# Patient Record
Sex: Male | Born: 1940
Health system: Southern US, Community
[De-identification: ages and names within clinical notes are randomized; demographics above are authoritative.]

## PROBLEM LIST (undated history)

## (undated) DIAGNOSIS — I10 Essential (primary) hypertension: Secondary | ICD-10-CM

## (undated) DIAGNOSIS — R040 Epistaxis: Secondary | ICD-10-CM

## (undated) DIAGNOSIS — K2270 Barrett's esophagus without dysplasia: Secondary | ICD-10-CM

## (undated) DIAGNOSIS — K219 Gastro-esophageal reflux disease without esophagitis: Secondary | ICD-10-CM

## (undated) DIAGNOSIS — M1990 Unspecified osteoarthritis, unspecified site: Secondary | ICD-10-CM

## (undated) DIAGNOSIS — R140 Abdominal distension (gaseous): Secondary | ICD-10-CM

## (undated) DIAGNOSIS — I2510 Atherosclerotic heart disease of native coronary artery without angina pectoris: Secondary | ICD-10-CM

## (undated) DIAGNOSIS — E785 Hyperlipidemia, unspecified: Secondary | ICD-10-CM

## (undated) DIAGNOSIS — R319 Hematuria, unspecified: Secondary | ICD-10-CM

## (undated) HISTORY — DX: Hematuria, unspecified: R31.9

## (undated) HISTORY — DX: Barrett's esophagus without dysplasia: K22.70

## (undated) HISTORY — DX: Unspecified osteoarthritis, unspecified site: M19.90

## (undated) HISTORY — PX: CARDIAC CATHETERIZATION: SHX172

## (undated) HISTORY — DX: Abdominal distension (gaseous): R14.0

---
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
## 1974-01-17 HISTORY — PX: VASECTOMY: SHX75

## 2008-02-25 ENCOUNTER — Encounter: Admission: RE | Admit: 2008-02-25 | Discharge: 2008-02-25 | Payer: Self-pay | Admitting: Family Medicine

## 2008-06-01 ENCOUNTER — Encounter: Admission: RE | Admit: 2008-06-01 | Discharge: 2008-06-01 | Payer: Self-pay | Admitting: Family Medicine

## 2008-10-31 ENCOUNTER — Emergency Department (HOSPITAL_COMMUNITY): Admission: EM | Admit: 2008-10-31 | Discharge: 2008-10-31 | Payer: Self-pay | Admitting: Emergency Medicine

## 2009-09-11 LAB — HM COLONOSCOPY

## 2010-04-22 LAB — CBC
Hemoglobin: 16.4 g/dL (ref 13.0–17.0)
MCHC: 36.1 g/dL — ABNORMAL HIGH (ref 30.0–36.0)
Platelets: 194 10*3/uL (ref 150–400)
RDW: 13.2 % (ref 11.5–15.5)

## 2010-04-22 LAB — DIFFERENTIAL
Basophils Absolute: 0.1 10*3/uL (ref 0.0–0.1)
Eosinophils Absolute: 0.3 10*3/uL (ref 0.0–0.7)
Lymphocytes Relative: 21 % (ref 12–46)
Monocytes Relative: 9 % (ref 3–12)
Neutro Abs: 4.1 10*3/uL (ref 1.7–7.7)
Smear Review: ADEQUATE

## 2010-04-22 LAB — PROTIME-INR: INR: 0.99 (ref 0.00–1.49)

## 2010-12-14 ENCOUNTER — Ambulatory Visit (INDEPENDENT_AMBULATORY_CARE_PROVIDER_SITE_OTHER): Payer: Self-pay | Admitting: General Surgery

## 2011-01-26 ENCOUNTER — Encounter (INDEPENDENT_AMBULATORY_CARE_PROVIDER_SITE_OTHER): Payer: Self-pay | Admitting: General Surgery

## 2011-01-26 ENCOUNTER — Ambulatory Visit (INDEPENDENT_AMBULATORY_CARE_PROVIDER_SITE_OTHER): Payer: Medicare Other | Admitting: General Surgery

## 2011-01-26 VITALS — BP 152/96 | HR 67 | Temp 98.4°F | Ht 67.0 in | Wt 186.8 lb

## 2011-01-26 DIAGNOSIS — K429 Umbilical hernia without obstruction or gangrene: Secondary | ICD-10-CM | POA: Insufficient documentation

## 2011-01-26 NOTE — Patient Instructions (Signed)
Avoid repetitive heavy lifting as much as possible.

## 2011-01-26 NOTE — Progress Notes (Signed)
Patient ID: Alex Martin, male   DOB: 11-05-1940, 71 y.o.   MRN: [id number]  Chief Complaint  Patient presents with  . Pre-op Exam    eval hernia    HPI Alex Martin is a 71 y.o. male.   HPI  He is referred by Dr. Tanya Martin for further evaluation and treatment of an umbilical hernia. Alex Martin states that he has had this for 2 or 3 years. It is getting larger. It does not cause him a lot of pain. He doesn't have to do a lot of straining. He is interested in having it repaired. He had a previous left inguinal hernia repair and had no problems with this.  Past Medical History  Diagnosis Date  . Abdominal distension   . Blood in urine     Past Surgical History  Procedure Date  . Vasectomy 1976  . Hernia repair 1959    [facility]    History reviewed. No pertinent family history.  Social History History  Substance Use Topics  . Smoking status: Former Smoker    Quit date: 01/25/2001  . Smokeless tobacco: Not on file  . Alcohol Use: 1.2 oz/week    2 Glasses of wine per week    No Known Allergies  Current Outpatient Prescriptions  Medication Sig Dispense Refill  . losartan (COZAAR) 25 MG tablet Take 25 mg by mouth daily.      . pravastatin (PRAVACHOL) 20 MG tablet Take 20 mg by mouth daily.        Review of Systems Review of Systems  Constitutional: Negative for fever and chills.  HENT: Negative for congestion, rhinorrhea and sneezing.   Respiratory: Negative.   Cardiovascular: Negative.   Gastrointestinal:       As per hpi  Genitourinary: Positive for hematuria.  Neurological: Negative for seizures and headaches.  Hematological: Does not bruise/bleed easily.    Blood pressure 152/96, pulse 67, temperature 98.4 F (36.9 C), temperature source Temporal, height 5\' 7"  (1.702 m), weight 186 lb 12.8 oz (84.732 kg), SpO2 96.00%.  Physical Exam Physical Exam  Constitutional: He appears well-developed and well-nourished. No distress.  HENT:  Head: Normocephalic and  atraumatic.  Neck: Neck supple.  Cardiovascular: Normal rate and regular rhythm.   No murmur heard. Pulmonary/Chest: Effort normal and breath sounds normal.  Abdominal: Soft. He exhibits no distension and no mass. There is no tenderness.       A reducible umbilical hernia is present. A diastasis recti is present.  Musculoskeletal: Normal range of motion. He exhibits no edema.  Lymphadenopathy:    He has no cervical adenopathy.  Skin: Skin is warm and dry.    Data Reviewed Alex Martin note  Assessment    Enlarging umbilical hernia.  He is interested in repair.    Plan    Umbilical hernia repair with mesh.  I have explained the procedure, risks, and aftercare of umbilical hernia repair.  Risks include but are not limited to bleeding, infection, wound problems, anesthesia, recurrence,  intestine injury,  chronic pain, mesh problems.  He seems to understand and agrees to proceed.       Alex Martin J 01/26/2011, 9:46 AM

## 2011-02-03 ENCOUNTER — Encounter (HOSPITAL_COMMUNITY): Payer: Self-pay | Admitting: Pharmacy Technician

## 2011-02-10 ENCOUNTER — Encounter (HOSPITAL_COMMUNITY)
Admission: RE | Admit: 2011-02-10 | Discharge: 2011-02-10 | Disposition: A | Discharge status: Home / Self Care | Payer: Medicare Other | Source: Ambulatory Visit | Attending: General Surgery | Admitting: General Surgery

## 2011-02-10 ENCOUNTER — Ambulatory Visit (HOSPITAL_COMMUNITY)
Admission: RE | Admit: 2011-02-10 | Discharge: 2011-02-10 | Disposition: A | Discharge status: Home / Self Care | Payer: Medicare Other | Source: Ambulatory Visit | Attending: General Surgery | Admitting: General Surgery

## 2011-02-10 ENCOUNTER — Other Ambulatory Visit: Payer: Self-pay

## 2011-02-10 ENCOUNTER — Encounter (HOSPITAL_COMMUNITY): Payer: Self-pay

## 2011-02-10 DIAGNOSIS — Z01812 Encounter for preprocedural laboratory examination: Secondary | ICD-10-CM | POA: Insufficient documentation

## 2011-02-10 DIAGNOSIS — Z01810 Encounter for preprocedural cardiovascular examination: Secondary | ICD-10-CM | POA: Insufficient documentation

## 2011-02-10 DIAGNOSIS — K469 Unspecified abdominal hernia without obstruction or gangrene: Secondary | ICD-10-CM | POA: Insufficient documentation

## 2011-02-10 DIAGNOSIS — I498 Other specified cardiac arrhythmias: Secondary | ICD-10-CM | POA: Insufficient documentation

## 2011-02-10 DIAGNOSIS — Z87891 Personal history of nicotine dependence: Secondary | ICD-10-CM | POA: Insufficient documentation

## 2011-02-10 DIAGNOSIS — Z01818 Encounter for other preprocedural examination: Secondary | ICD-10-CM | POA: Insufficient documentation

## 2011-02-10 DIAGNOSIS — I10 Essential (primary) hypertension: Secondary | ICD-10-CM | POA: Insufficient documentation

## 2011-02-10 HISTORY — DX: Gastro-esophageal reflux disease without esophagitis: K21.9

## 2011-02-10 HISTORY — DX: Essential (primary) hypertension: I10

## 2011-02-10 HISTORY — DX: Epistaxis: R04.0

## 2011-02-10 LAB — CBC
HCT: 46.3 % (ref 39.0–52.0)
MCH: 33.3 pg (ref 26.0–34.0)
MCV: 95.1 fL (ref 78.0–100.0)
RBC: 4.87 MIL/uL (ref 4.22–5.81)
RDW: 12.9 % (ref 11.5–15.5)
WBC: 6.8 10*3/uL (ref 4.0–10.5)

## 2011-02-10 LAB — COMPREHENSIVE METABOLIC PANEL
BUN: 24 mg/dL — ABNORMAL HIGH (ref 6–23)
CO2: 27 mEq/L (ref 19–32)
Calcium: 9.4 mg/dL (ref 8.4–10.5)
Chloride: 103 mEq/L (ref 96–112)
Creatinine, Ser: 1.06 mg/dL (ref 0.50–1.35)
GFR calc non Af Amer: 69 mL/min — ABNORMAL LOW (ref >90)
Total Bilirubin: 0.7 mg/dL (ref 0.3–1.2)

## 2011-02-10 LAB — DIFFERENTIAL
Basophils Relative: 1 % (ref 0–1)
Eosinophils Absolute: 0.3 10*3/uL (ref 0.0–0.7)
Monocytes Absolute: 0.8 10*3/uL (ref 0.1–1.0)
Monocytes Relative: 11 % (ref 3–12)

## 2011-02-10 LAB — PROTIME-INR
INR: 1.05 (ref 0.00–1.49)
Prothrombin Time: 13.9 seconds (ref 11.6–15.2)

## 2011-02-10 NOTE — Pre-Procedure Instructions (Signed)
02/10/11 4:53 PM.  PT'S PREOP LABS PCR, CBC WITH DIFF, CMET AND PT, EKG AND CHEST XRAY WERE ALL COMPLETED TODAY -PREOP.  ALL RESULTS AVAILABLE IN EPIC EXCEPT THE DIFFERENTIAL.  PAPER COPY OF THE DIFF OBTAINED FROM THE LAB AND PLACED ON PT'S CHART.

## 2011-02-10 NOTE — Patient Instructions (Signed)
20 Alex Martin  02/10/2011   Your procedure is scheduled on:  Tuesday 1/29 AT 9:35 AM  Report to [facility] at 7:30 AM.  Call this number if you have problems the morning of surgery: [phone]   Remember:   Do not eat food OR DRINK ANYTHING AFTER MIDNIGHT THE NIGHT BEFORE YOUR SURGERY.    Take these medicines the morning of surgery with A SIP OF WATER: PRILOSEC AND VESICARE   Do not wear jewelry.  Do not wear lotions.    Do not bring valuables to the hospital.  Contacts, dentures or bridgework may not be worn into surgery.  Leave suitcase in the car. After surgery it may be brought to your room.  For patients admitted to the hospital, checkout time is 11:00 AM the day of discharge.   Patients discharged the day of surgery will not be allowed to drive home.  Name and phone number of your driver: [facility]  [phone]  Special Instructions: CHG Shower Use Special Wash: 1/2 bottle night before surgery and 1/2 bottle morning of surgery.   Please read over the following fact sheets that you were given: MRSA Information

## 2011-02-15 ENCOUNTER — Encounter (HOSPITAL_COMMUNITY): Payer: Self-pay | Admitting: Anesthesiology

## 2011-02-15 ENCOUNTER — Encounter (HOSPITAL_COMMUNITY): Payer: Self-pay | Admitting: *Deleted

## 2011-02-15 ENCOUNTER — Encounter (HOSPITAL_COMMUNITY)
Admission: RE | Disposition: A | Discharge status: Home / Self Care | Payer: Self-pay | Source: Ambulatory Visit | Attending: General Surgery

## 2011-02-15 ENCOUNTER — Ambulatory Visit (HOSPITAL_COMMUNITY)
Admission: RE | Admit: 2011-02-15 | Discharge: 2011-02-15 | Disposition: A | Discharge status: Home / Self Care | Payer: Medicare Other | Source: Ambulatory Visit | Attending: General Surgery | Admitting: General Surgery

## 2011-02-15 ENCOUNTER — Ambulatory Visit (HOSPITAL_COMMUNITY): Payer: Medicare Other | Admitting: Anesthesiology

## 2011-02-15 DIAGNOSIS — Z79899 Other long term (current) drug therapy: Secondary | ICD-10-CM | POA: Insufficient documentation

## 2011-02-15 DIAGNOSIS — K429 Umbilical hernia without obstruction or gangrene: Secondary | ICD-10-CM | POA: Insufficient documentation

## 2011-02-15 HISTORY — PX: UMBILICAL HERNIA REPAIR: SHX196

## 2011-02-15 SURGERY — REPAIR, HERNIA, UMBILICAL, ADULT
Anesthesia: General | Site: Abdomen | Wound class: Clean

## 2011-02-15 MED ORDER — ACETAMINOPHEN 10 MG/ML IV SOLN
INTRAVENOUS | Status: AC
  Filled 2011-02-15: qty 100

## 2011-02-15 MED ORDER — GLYCOPYRROLATE 0.2 MG/ML IJ SOLN
INTRAMUSCULAR | Status: DC | PRN
  Administered 2011-02-15: .8 mg via INTRAVENOUS

## 2011-02-15 MED ORDER — BUPIVACAINE-EPINEPHRINE 0.5% -1:200000 IJ SOLN
INTRAMUSCULAR | Status: DC | PRN
  Administered 2011-02-15: 23 mL

## 2011-02-15 MED ORDER — MIDAZOLAM HCL 5 MG/5ML IJ SOLN
INTRAMUSCULAR | Status: DC | PRN
  Administered 2011-02-15: 2 mg via INTRAVENOUS

## 2011-02-15 MED ORDER — PROPOFOL 10 MG/ML IV EMUL
INTRAVENOUS | Status: DC | PRN
  Administered 2011-02-15: 170 mg via INTRAVENOUS

## 2011-02-15 MED ORDER — BUPIVACAINE-EPINEPHRINE 0.25% -1:200000 IJ SOLN
INTRAMUSCULAR | Status: AC
  Filled 2011-02-15: qty 1

## 2011-02-15 MED ORDER — NEOSTIGMINE METHYLSULFATE 1 MG/ML IJ SOLN
INTRAMUSCULAR | Status: DC | PRN
  Administered 2011-02-15: 5 mg via INTRAVENOUS

## 2011-02-15 MED ORDER — PROMETHAZINE HCL 25 MG/ML IJ SOLN: 6.2500 mg | INTRAMUSCULAR | Status: DC | PRN

## 2011-02-15 MED ORDER — FENTANYL CITRATE 0.05 MG/ML IJ SOLN
INTRAMUSCULAR | Status: DC | PRN
  Administered 2011-02-15: 100 ug via INTRAVENOUS

## 2011-02-15 MED ORDER — CEFAZOLIN SODIUM-DEXTROSE 2-3 GM-% IV SOLR
INTRAVENOUS | Status: AC
  Filled 2011-02-15: qty 50

## 2011-02-15 MED ORDER — OXYCODONE-ACETAMINOPHEN 5-325 MG PO TABS: 1.0000 | ORAL_TABLET | ORAL | Status: AC | PRN

## 2011-02-15 MED ORDER — KETOROLAC TROMETHAMINE 30 MG/ML IJ SOLN: 15.0000 mg | Freq: Once | INTRAMUSCULAR | Status: DC | PRN

## 2011-02-15 MED ORDER — ONDANSETRON HCL 4 MG/2ML IJ SOLN
INTRAMUSCULAR | Status: DC | PRN
  Administered 2011-02-15: 4 mg via INTRAVENOUS

## 2011-02-15 MED ORDER — FENTANYL CITRATE 0.05 MG/ML IJ SOLN: 25.0000 ug | INTRAMUSCULAR | Status: DC | PRN

## 2011-02-15 MED ORDER — EPHEDRINE SULFATE 50 MG/ML IJ SOLN
INTRAMUSCULAR | Status: DC | PRN
  Administered 2011-02-15: 10 mg via INTRAVENOUS

## 2011-02-15 MED ORDER — BUPIVACAINE-EPINEPHRINE (PF) 0.5% -1:200000 IJ SOLN
INTRAMUSCULAR | Status: AC
  Filled 2011-02-15: qty 10

## 2011-02-15 MED ORDER — LACTATED RINGERS IV SOLN
INTRAVENOUS | Status: DC
  Administered 2011-02-15: 1000 mL via INTRAVENOUS

## 2011-02-15 MED ORDER — CEFAZOLIN SODIUM-DEXTROSE 2-3 GM-% IV SOLR
2.0000 g | Freq: Once | INTRAVENOUS | Status: AC
  Administered 2011-02-15: 2 g via INTRAVENOUS

## 2011-02-15 MED ORDER — PHENYLEPHRINE HCL 10 MG/ML IJ SOLN
INTRAMUSCULAR | Status: DC | PRN
  Administered 2011-02-15 (×2): 80 ug via INTRAVENOUS

## 2011-02-15 MED ORDER — LIDOCAINE HCL (CARDIAC) 20 MG/ML IV SOLN
INTRAVENOUS | Status: DC | PRN
  Administered 2011-02-15: 50 mg via INTRAVENOUS

## 2011-02-15 MED ORDER — ROCURONIUM BROMIDE 100 MG/10ML IV SOLN
INTRAVENOUS | Status: DC | PRN
  Administered 2011-02-15: 50 mg via INTRAVENOUS

## 2011-02-15 MED ORDER — DEXAMETHASONE SODIUM PHOSPHATE 10 MG/ML IJ SOLN
INTRAMUSCULAR | Status: DC | PRN
  Administered 2011-02-15: 10 mg via INTRAVENOUS

## 2011-02-15 SURGICAL SUPPLY — 37 items
BENZOIN TINCTURE PRP APPL 2/3 (GAUZE/BANDAGES/DRESSINGS) ×2 IMPLANT
BLADE HEX COATED 2.75 (ELECTRODE) ×2 IMPLANT
BLADE SURG SZ10 CARB STEEL (BLADE) ×4 IMPLANT
CHLORAPREP W/TINT 26ML (MISCELLANEOUS) ×2 IMPLANT
CLOTH BEACON ORANGE TIMEOUT ST (SAFETY) ×2 IMPLANT
DECANTER SPIKE VIAL GLASS SM (MISCELLANEOUS) ×2 IMPLANT
DRAPE INCISE IOBAN 66X45 STRL (DRAPES) ×2 IMPLANT
DRAPE LAPAROTOMY T 102X78X121 (DRAPES) ×2 IMPLANT
DRSG TEGADERM 4X4.75 (GAUZE/BANDAGES/DRESSINGS) ×2 IMPLANT
ELECT REM PT RETURN 9FT ADLT (ELECTROSURGICAL) ×2
ELECTRODE REM PT RTRN 9FT ADLT (ELECTROSURGICAL) ×1 IMPLANT
GAUZE SPONGE 4X4 12PLY STRL LF (GAUZE/BANDAGES/DRESSINGS) ×2 IMPLANT
GLOVE BIOGEL PI IND STRL 7.0 (GLOVE) ×1 IMPLANT
GLOVE BIOGEL PI INDICATOR 7.0 (GLOVE) ×1
GLOVE ECLIPSE 8.0 STRL XLNG CF (GLOVE) ×2 IMPLANT
GLOVE INDICATOR 8.0 STRL GRN (GLOVE) ×2 IMPLANT
GOWN STRL NON-REIN LRG LVL3 (GOWN DISPOSABLE) ×2 IMPLANT
GOWN STRL REIN XL XLG (GOWN DISPOSABLE) ×4 IMPLANT
KIT BASIN OR (CUSTOM PROCEDURE TRAY) ×2 IMPLANT
MESH HERNIA 3X6 (Mesh General) ×2 IMPLANT
NEEDLE HYPO 22GX1.5 SAFETY (NEEDLE) ×2 IMPLANT
NS IRRIG 1000ML POUR BTL (IV SOLUTION) ×2 IMPLANT
PACK BASIC VI WITH GOWN DISP (CUSTOM PROCEDURE TRAY) ×2 IMPLANT
PENCIL BUTTON HOLSTER BLD 10FT (ELECTRODE) ×2 IMPLANT
SOL PREP POV-IOD 16OZ 10% (MISCELLANEOUS) ×2 IMPLANT
SPONGE GAUZE 4X4 12PLY (GAUZE/BANDAGES/DRESSINGS) ×2 IMPLANT
SPONGE LAP 4X18 X RAY DECT (DISPOSABLE) ×2 IMPLANT
STRIP CLOSURE SKIN 1/2X4 (GAUZE/BANDAGES/DRESSINGS) ×2 IMPLANT
SUT MNCRL AB 4-0 PS2 18 (SUTURE) ×2 IMPLANT
SUT PROLENE 0 CT 2 (SUTURE) ×2 IMPLANT
SUT SURG 0 T 19/GS 22 1969 62 (SUTURE) ×2 IMPLANT
SUT VIC AB 3-0 SH 27 (SUTURE) ×1
SUT VIC AB 3-0 SH 27XBRD (SUTURE) ×1 IMPLANT
SYR BULB IRRIGATION 50ML (SYRINGE) ×2 IMPLANT
SYR CONTROL 10ML LL (SYRINGE) ×2 IMPLANT
TOWEL OR 17X26 10 PK STRL BLUE (TOWEL DISPOSABLE) ×2 IMPLANT
YANKAUER SUCT BULB TIP 10FT TU (MISCELLANEOUS) ×2 IMPLANT

## 2011-02-15 NOTE — Anesthesia Preprocedure Evaluation (Signed)
Anesthesia Evaluation  Patient identified by MRN, date of birth, ID band Patient awake    Reviewed: Allergy & Precautions, H&P , NPO status , Patient's Chart, lab work & pertinent test results  Airway Mallampati: II TM Distance: >3 FB Neck ROM: Full    Dental No notable dental hx.    Pulmonary neg pulmonary ROS,  clear to auscultation  Pulmonary exam normal       Cardiovascular hypertension, Regular Normal    Neuro/Psych Negative Neurological ROS  Negative Psych ROS   GI/Hepatic Neg liver ROS, GERD-  ,  Endo/Other  Negative Endocrine ROS  Renal/GU negative Renal ROS  Genitourinary negative   Musculoskeletal negative musculoskeletal ROS (+)   Abdominal   Peds negative pediatric ROS (+)  Hematology negative hematology ROS (+)   Anesthesia Other Findings   Reproductive/Obstetrics negative OB ROS                           Anesthesia Physical Anesthesia Plan  ASA: II  Anesthesia Plan: General   Post-op Pain Management:    Induction: Intravenous  Airway Management Planned: LMA and Oral ETT  Additional Equipment:   Intra-op Plan:   Post-operative Plan: Extubation in OR  Informed Consent: I have reviewed the patients History and Physical, chart, labs and discussed the procedure including the risks, benefits and alternatives for the proposed anesthesia with the patient or authorized representative who has indicated his/her understanding and acceptance.   Dental advisory given  Plan Discussed with: CRNA  Anesthesia Plan Comments:         Anesthesia Quick Evaluation

## 2011-02-15 NOTE — Op Note (Signed)
Operative Note  Alex Martin male 71 y.o. 02/15/2011  PREOPERATIVE DX:  Umbilical hernia  POSTOPERATIVE DX:  Same  PROCEDURE:  Umbilical hernia repair with mesh         Surgeon: Adolph Pollack   Assistants: None  Anesthesia: General endotracheal anesthesia  Indications: This is a 71 year old man with an umbilical hernia that has become larger over time. He presents to the hospital for elective repair. The procedure, risks, and after care were discussed with him preoperatively.    Procedure Detail:  He was seen in the holding area and brought to the operating room placed supine on the operating table and a general anesthetic was administered. The hair in the periumbilical area was clipped and the area sterilely prepped and draped. The hernia was reduced.  Local anesthetic consisting of 1/2% Marcaine was infiltrated superficially and deep in the periumbilical area.  A transverse subumbilical incision was made through the skin and subcutaneous tissue until the fascia was identified. The subcutaneous tissue was dissected free from the umbilical stalk in a circumferential fashion. The umbilical stalk was dissected off the fascia exposing the hernia defect. There were actually 2 defects with a thin band of fascia between them. This thin band was divided creating one defect that was approximately 2 cm in size. Extraperitoneal fat was then reduced back through the hernia.  The subcutaneous tissue was lifted off the fascia and dissected free from it a distance to 3-4 cm around the hernia defect.  The hernia defect was then primarily closed with interrupted 0 Surgilon sutures left long. A piece of polypropylene mesh was brought into the field and the sutures threaded through it and tied down anchoring the mesh directly over the defect. The periphery of the mesh was then anchored to the fascia the running 0 Prolene suture an extra mesh was removed. This provided for good coverage with good  overlap of the primary repair and defect.  Local anesthetic was infiltrated in the fascia. Hemostasis was adequate.  The umbilicus was implanted on the fascia with a 3-0 Vicryl suture. The subcutaneous tissue was closed over the mesh with running 3-0 Vicryl suture. The skin was closed with running 4-0 Monocryl subcuticular stitch. Steri-Strips and sterile dressings were applied.  He tolerated the procedure well without any apparent complications and was taken to the recovery room in satisfactory condition.    Estimated Blood Loss:  less than 50 mL         Drains: none          Blood Given: none          Specimens: none         Complications:  * No complications entered in OR log *         Disposition: PACU - hemodynamically stable.         Condition: stable

## 2011-02-15 NOTE — Transfer of Care (Signed)
Immediate Anesthesia Transfer of Care Note  Patient: Alex Martin  Procedure(s) Performed:  HERNIA REPAIR UMBILICAL ADULT - umbilical hernia repair with mesh  Patient Location: PACU  Anesthesia Type: General  Level of Consciousness: awake, alert , oriented and patient cooperative  Airway & Oxygen Therapy: Patient Spontanous Breathing and Patient connected to face mask oxygen  Post-op Assessment: Report given to PACU RN and Post -op Vital signs reviewed and stable  Post vital signs: stable  Complications: No apparent anesthesia complications

## 2011-02-15 NOTE — H&P (View-Only) (Signed)
Patient ID: Alex Martin, male   DOB: 10/14/1940, 70 y.o.   MRN: [id number]  Chief Complaint  Patient presents with  . Pre-op Exam    eval hernia    HPI Alex Martin is a 70 y.o. male.   HPI  He is referred by Dr. Pickard for further evaluation and treatment of an umbilical hernia. Alex Martin states that he has had this for 2 or 3 years. It is getting larger. It does not cause him a lot of pain. He doesn't have to do a lot of straining. He is interested in having it repaired. He had a previous left inguinal hernia repair and had no problems with this.  Past Medical History  Diagnosis Date  . Abdominal distension   . Blood in urine     Past Surgical History  Procedure Date  . Vasectomy 1976  . Hernia repair 1959    [facility]    History reviewed. No pertinent family history.  Social History History  Substance Use Topics  . Smoking status: Former Smoker    Quit date: 01/25/2001  . Smokeless tobacco: Not on file  . Alcohol Use: 1.2 oz/week    2 Glasses of wine per week    No Known Allergies  Current Outpatient Prescriptions  Medication Sig Dispense Refill  . losartan (COZAAR) 25 MG tablet Take 25 mg by mouth daily.      . pravastatin (PRAVACHOL) 20 MG tablet Take 20 mg by mouth daily.        Review of Systems Review of Systems  Constitutional: Negative for fever and chills.  HENT: Negative for congestion, rhinorrhea and sneezing.   Respiratory: Negative.   Cardiovascular: Negative.   Gastrointestinal:       As per hpi  Genitourinary: Positive for hematuria.  Neurological: Negative for seizures and headaches.  Hematological: Does not bruise/bleed easily.    Blood pressure 152/96, pulse 67, temperature 98.4 F (36.9 C), temperature source Temporal, height 5' 7" (1.702 m), weight 186 lb 12.8 oz (84.732 kg), SpO2 96.00%.  Physical Exam Physical Exam  Constitutional: He appears well-developed and well-nourished. No distress.  HENT:  Head: Normocephalic and  atraumatic.  Neck: Neck supple.  Cardiovascular: Normal rate and regular rhythm.   No murmur heard. Pulmonary/Chest: Effort normal and breath sounds normal.  Abdominal: Soft. He exhibits no distension and no mass. There is no tenderness.       A reducible umbilical hernia is present. A diastasis recti is present.  Musculoskeletal: Normal range of motion. He exhibits no edema.  Lymphadenopathy:    He has no cervical adenopathy.  Skin: Skin is warm and dry.    Data Reviewed Dr. Pickard's note  Assessment    Enlarging umbilical hernia.  He is interested in repair.    Plan    Umbilical hernia repair with mesh.  I have explained the procedure, risks, and aftercare of umbilical hernia repair.  Risks include but are not limited to bleeding, infection, wound problems, anesthesia, recurrence,  intestine injury,  chronic pain, mesh problems.  He seems to understand and agrees to proceed.       Navaya Wiatrek J 01/26/2011, 9:46 AM    

## 2011-02-15 NOTE — Anesthesia Postprocedure Evaluation (Signed)
  Anesthesia Post-op Note  Patient: Alex Martin  Procedure(s) Performed:  HERNIA REPAIR UMBILICAL ADULT - umbilical hernia repair with mesh  Patient Location: PACU  Anesthesia Type: General  Level of Consciousness: awake and alert   Airway and Oxygen Therapy: Patient Spontanous Breathing  Post-op Pain: mild  Post-op Assessment: Post-op Vital signs reviewed, Patient's Cardiovascular Status Stable, Respiratory Function Stable, Patent Airway and No signs of Nausea or vomiting  Post-op Vital Signs: stable  Complications: No apparent anesthesia complications

## 2011-02-15 NOTE — Progress Notes (Signed)
Pt up in room and ambulated to bathroom. Pt voided moderate amount of clear yellow urine.  Pt tolerated ambulation well.  Discharge instructions given to pt and wife. Both verbalized understanding.  Pt's ice pack refilled before discharge. Pt's skin is pink, warm and intact at discharge.

## 2011-02-15 NOTE — Interval H&P Note (Signed)
History and Physical Interval Note:  02/15/2011 9:53 AM  Alex Martin  has presented today for surgery, with the diagnosis of umbilical hernia  The various methods of treatment have been discussed with the patient and family. After consideration of risks, benefits and other options for treatment, the patient has consented to  Procedure(s): HERNIA REPAIR UMBILICAL ADULT as a surgical intervention .  The patients' history has been reviewed, patient examined, no change in status, stable for surgery.  I have reviewed the patients' chart and labs.  Questions were answered to the patient's satisfaction.     [name] Shela Commons

## 2011-02-17 ENCOUNTER — Encounter (HOSPITAL_COMMUNITY): Payer: Self-pay | Admitting: General Surgery

## 2011-02-22 ENCOUNTER — Encounter (INDEPENDENT_AMBULATORY_CARE_PROVIDER_SITE_OTHER): Payer: Self-pay | Admitting: General Surgery

## 2011-02-22 ENCOUNTER — Ambulatory Visit (INDEPENDENT_AMBULATORY_CARE_PROVIDER_SITE_OTHER): Payer: Medicare Other | Admitting: General Surgery

## 2011-02-22 ENCOUNTER — Encounter (INDEPENDENT_AMBULATORY_CARE_PROVIDER_SITE_OTHER): Payer: Medicare Other | Admitting: General Surgery

## 2011-02-22 VITALS — BP 126/82 | HR 66 | Temp 97.8°F | Resp 16 | Ht 67.0 in | Wt 180.4 lb

## 2011-02-22 DIAGNOSIS — Z9889 Other specified postprocedural states: Secondary | ICD-10-CM

## 2011-02-22 NOTE — Patient Instructions (Signed)
No lifting pushing or pulling over 10 pounds.

## 2011-02-22 NOTE — Progress Notes (Signed)
Operation:  Umbilical hernia repair with mesh  Date:  February 15, 2011  Pathology: na  HPI:  He is here for his first postop visit and is doing well. He is essentially pain free. He is driving.  PE: Abdomen-incision is clean and intact with some surrounding ecchymosis and a firm healing ridge  Assessment: Doing well postoperatively.  Plan: Continue activity restrictions. Return visit 5 weeks.

## 2011-03-24 ENCOUNTER — Encounter (INDEPENDENT_AMBULATORY_CARE_PROVIDER_SITE_OTHER): Payer: Self-pay | Admitting: General Surgery

## 2011-03-24 ENCOUNTER — Ambulatory Visit (INDEPENDENT_AMBULATORY_CARE_PROVIDER_SITE_OTHER): Payer: Medicare Other | Admitting: General Surgery

## 2011-03-24 VITALS — BP 138/82 | HR 80 | Temp 97.3°F | Resp 16 | Ht 67.0 in | Wt 184.6 lb

## 2011-03-24 DIAGNOSIS — Z9889 Other specified postprocedural states: Secondary | ICD-10-CM

## 2011-03-24 NOTE — Patient Instructions (Signed)
Activities as tolerated. 

## 2011-03-24 NOTE — Progress Notes (Signed)
He/ presents for a second postop visit after umbilical hernia repair with mesh.  He is doing well and is eager to resume his normal activities. P.E.  ABD:  Soft, incision clean/dry/intact, repair is solid    Assessment:  Doing well post hernia repair.  Plan: Resume normal activities as tolerated. Avoid activities that cause significant discomfort for the long term.  Return visit as needed.

## 2011-03-31 ENCOUNTER — Encounter (INDEPENDENT_AMBULATORY_CARE_PROVIDER_SITE_OTHER): Payer: Medicare Other | Admitting: General Surgery

## 2011-06-18 DEATH — deceased

## 2012-06-27 ENCOUNTER — Other Ambulatory Visit (INDEPENDENT_AMBULATORY_CARE_PROVIDER_SITE_OTHER): Payer: Medicare Other

## 2012-06-27 DIAGNOSIS — I10 Essential (primary) hypertension: Secondary | ICD-10-CM

## 2012-06-27 DIAGNOSIS — N400 Enlarged prostate without lower urinary tract symptoms: Secondary | ICD-10-CM

## 2012-06-27 DIAGNOSIS — Z79899 Other long term (current) drug therapy: Secondary | ICD-10-CM

## 2012-06-27 DIAGNOSIS — E785 Hyperlipidemia, unspecified: Secondary | ICD-10-CM

## 2012-06-27 LAB — LIPID PANEL
Cholesterol: 172 mg/dL (ref 0–200)
HDL: 58 mg/dL (ref >39)
Total CHOL/HDL Ratio: 3 Ratio
VLDL: 19 mg/dL (ref 0–40)

## 2012-06-27 LAB — CBC WITH DIFFERENTIAL/PLATELET
Basophils Absolute: 0.1 10*3/uL (ref 0.0–0.1)
Basophils Relative: 1 % (ref 0–1)
Eosinophils Absolute: 0.3 10*3/uL (ref 0.0–0.7)
Eosinophils Relative: 7 % — ABNORMAL HIGH (ref 0–5)
MCH: 33.3 pg (ref 26.0–34.0)
MCHC: 34.1 g/dL (ref 30.0–36.0)
MCV: 97.8 fL (ref 78.0–100.0)
Neutrophils Relative %: 54 % (ref 43–77)
Platelets: 163 10*3/uL (ref 150–400)
RDW: 13.4 % (ref 11.5–15.5)

## 2012-06-27 LAB — TSH: TSH: 2.903 u[IU]/mL (ref 0.350–4.500)

## 2012-08-13 ENCOUNTER — Ambulatory Visit (INDEPENDENT_AMBULATORY_CARE_PROVIDER_SITE_OTHER): Payer: Medicare Other | Admitting: Family Medicine

## 2012-08-13 ENCOUNTER — Encounter: Payer: Medicare Other | Admitting: Family Medicine

## 2012-08-13 ENCOUNTER — Telehealth: Payer: Self-pay | Admitting: Family Medicine

## 2012-08-13 ENCOUNTER — Encounter: Payer: Self-pay | Admitting: Family Medicine

## 2012-08-13 VITALS — BP 136/74 | HR 78 | Temp 98.1°F | Resp 14 | Ht 67.5 in | Wt 165.0 lb

## 2012-08-13 DIAGNOSIS — D485 Neoplasm of uncertain behavior of skin: Secondary | ICD-10-CM

## 2012-08-13 DIAGNOSIS — D489 Neoplasm of uncertain behavior, unspecified: Secondary | ICD-10-CM

## 2012-08-13 DIAGNOSIS — Z0000 Encounter for general adult medical examination without abnormal findings: Secondary | ICD-10-CM

## 2012-08-13 LAB — COMPLETE METABOLIC PANEL WITH GFR
AST: 27 U/L (ref 0–37)
Albumin: 4.1 g/dL (ref 3.5–5.2)
BUN: 19 mg/dL (ref 6–23)
Calcium: 9.5 mg/dL (ref 8.4–10.5)
Chloride: 102 mEq/L (ref 96–112)
Potassium: 4.8 mEq/L (ref 3.5–5.3)

## 2012-08-13 NOTE — Progress Notes (Signed)
Subjective:    Patient ID: Alex Martin, male    DOB: 1940/04/05, 72 y.o.   MRN: [id number]  HPI Patient is here today for a complete physical exam. He has no concerns. He is doing well. He is retired. He is now competitively racing sailboats. He exercises on a daily basis. He is also rebuilding the deck around his house.  His most recent lab work is listed below: No visits with results within 1 Month(s) from this visit. Latest known visit with results is:  Lab on 06/27/2012  Component Date Value Range Status  . Cholesterol 06/27/2012 172  0 - 200 mg/dL Final   Comment: ATP III Classification:                                < 200        mg/dL        Desirable                               200 - 239     mg/dL        Borderline High                               >= 240        mg/dL        High                             . Triglycerides 06/27/2012 93  <150 mg/dL Final  . HDL 40/98/1191 58  >39 mg/dL Final  . Total CHOL/HDL Ratio 06/27/2012 3.0   Final  . VLDL 06/27/2012 19  0 - 40 mg/dL Final  . LDL Cholesterol 06/27/2012 95  0 - 99 mg/dL Final   Comment:                            Total Cholesterol/HDL Ratio:CHD Risk                                                 Coronary Heart Disease Risk Table                                                                 Men       Women                                   1/2 Average Risk              3.4        3.3                                       Average Risk  5.0        4.4                                    2X Average Risk              9.6        7.1                                    3X Average Risk             23.4       11.0                          Use the calculated Patient Ratio above and the CHD Risk table                           to determine the patient's CHD Risk.                          ATP III Classification (LDL):                                < 100        mg/dL         Optimal     147 - 129     mg/dL         Near or Above Optimal                               130 - 159     mg/dL         Borderline High                               160 - 189     mg/dL         High                                > 190        mg/dL         Very High                             . WBC 06/27/2012 4.3  4.0 - 10.5 K/uL Final  . RBC 06/27/2012 4.47  4.22 - 5.81 MIL/uL Final  . Hemoglobin 06/27/2012 14.9  13.0 - 17.0 g/dL Final  . HCT 82/95/6213 43.7  39.0 - 52.0 % Final  . MCV 06/27/2012 97.8  78.0 - 100.0 fL Final  . MCH 06/27/2012 33.3  26.0 - 34.0 pg Final  . MCHC 06/27/2012 34.1  30.0 - 36.0 g/dL Final  . RDW 08/65/7846 13.4  11.5 - 15.5 % Final  . Platelets 06/27/2012 163  150 - 400 K/uL Final  . Neutrophils Relative % 06/27/2012 54  43 - 77 % Final  . Neutro Abs 06/27/2012 2.3  1.7 - 7.7 K/uL Final  . Lymphocytes Relative 06/27/2012  29  12 - 46 % Final  . Lymphs Abs 06/27/2012 1.2  0.7 - 4.0 K/uL Final  . Monocytes Relative 06/27/2012 9  3 - 12 % Final  . Monocytes Absolute 06/27/2012 0.4  0.1 - 1.0 K/uL Final  . Eosinophils Relative 06/27/2012 7* 0 - 5 % Final  . Eosinophils Absolute 06/27/2012 0.3  0.0 - 0.7 K/uL Final  . Basophils Relative 06/27/2012 1  0 - 1 % Final  . Basophils Absolute 06/27/2012 0.1  0.0 - 0.1 K/uL Final  . Smear Review 06/27/2012 Criteria for review not met   Final  . TSH 06/27/2012 2.903  0.350 - 4.500 uIU/mL Final  . PSA 06/27/2012 0.39  <=4.00 ng/mL Final   Comment: Test Methodology: ECLIA PSA (Electrochemiluminescence Immunoassay)                                                     For PSA values from 2.5-4.0, particularly in younger men <60 years                          old, the AUA and NCCN suggest testing for % Free PSA (3515) and                          evaluation of the rate of increase in PSA (PSA velocity).   A CMP is pending at this time.  His most recent colonoscopy was in 2011. He had colon polyps. It is due again in 2016. He had a  pneumonia vaccine in 2008. He had the shingles vaccine in 2008. He recently had a tetanus shot this year. Past Medical History  Diagnosis Date  . Abdominal distension     UMBILICAL HERNIA-NO PAIN  . Blood in urine     MICROSCOPIC BLOODEVALUATED BY DR. NESI FRIDAY 02/11/11--URINE SAMPLE AND EXAM-- AND BLOOD SAMPLE AND TO BE SEEN AGAIN IN 6 MONTHS AND GIVEN VESICARE  FOR FREQUENCY AND URGENCY  . Hypertension   . Bleeding from the nose     SEVERAL EPISODES THAT REQUIRED TX IN ER--LAST EPISODE WAS APPROX 1 YR AGO  . GERD (gastroesophageal reflux disease)    Current Outpatient Prescriptions on File Prior to Visit  Medication Sig Dispense Refill  . losartan (COZAAR) 25 MG tablet Take 50 mg by mouth daily with breakfast.        No current facility-administered medications on file prior to visit.   Past Surgical History  Procedure Laterality Date  . Vasectomy  1976  . Hernia repair  1959    [facility]  . Umbilical hernia repair  02/15/2011    Procedure: HERNIA REPAIR UMBILICAL ADULT;  Surgeon: Adolph Pollack, MD;  Location: [facility];  Service: General;  Laterality: N/A;  umbilical hernia repair with mesh   Allergies  Allergen Reactions  . Shellfish Allergy     N&V AFTER EATING MUSSELS   History   Social History  . Marital Status: Single    Spouse Name: N/A    Number of Children: N/A  . Years of Education: N/A   Occupational History  . Not on file.   Social History Main Topics  . Smoking status: Former Smoker -- 10 years    Quit date: 02/22/1992  . Smokeless tobacco: Never Used  Comment: QUIT SMOKING ABOUT 1994 - 1 PP WEEK  . Alcohol Use: 0.0 oz/week     Comment: 2 GLASSES WINE DAILY  . Drug Use: No  . Sexually Active: Not on file   Other Topics Concern  . Not on file   Social History Narrative  . No narrative on file   Family History  Problem Relation Age of Onset  . Heart disease Mother   . Heart disease Father   . Cancer Sister     pancreatic      Review of  Systems  All other systems reviewed and are negative.       Objective:   Physical Exam  Vitals reviewed. Constitutional: He is oriented to person, place, and time. He appears well-developed and well-nourished. No distress.  HENT:  Head: Normocephalic and atraumatic.  Right Ear: External ear normal.  Left Ear: External ear normal.  Nose: Nose normal.  Mouth/Throat: Oropharynx is clear and moist. No oropharyngeal exudate.  Eyes: Conjunctivae and EOM are normal. Pupils are equal, round, and reactive to light. Right eye exhibits no discharge. Left eye exhibits no discharge. No scleral icterus.  Neck: Normal range of motion. Neck supple. No JVD present. No tracheal deviation present. No thyromegaly present.  Cardiovascular: Normal rate, regular rhythm, normal heart sounds and intact distal pulses.  Exam reveals no gallop and no friction rub.   No murmur heard. Pulmonary/Chest: Effort normal and breath sounds normal. No respiratory distress. He has no wheezes. He has no rales. He exhibits no tenderness.  Abdominal: Soft. Bowel sounds are normal. He exhibits no distension and no mass. There is no tenderness. There is no rebound and no guarding.  Genitourinary: Penis normal.  Musculoskeletal: Normal range of motion. He exhibits no edema and no tenderness.  Lymphadenopathy:    He has no cervical adenopathy.  Neurological: He is alert and oriented to person, place, and time. He has normal reflexes. He displays normal reflexes. No cranial nerve deficit. He exhibits normal muscle tone. Coordination normal.  Skin: Skin is warm. Rash noted. He is not diaphoretic. There is erythema. No pallor.  Psychiatric: He has a normal mood and affect. His behavior is normal. Judgment and thought content normal.   5 mm red scaly papule on the top of his head which appears to be an actinic keratoses versus an early squamous cell carcinoma.        Assessment & Plan:  1. Routine general medical examination at a  health care facility Patient's physical exam is completely normal. His lab work is excellent. I will check a CMP to evaluate his renal, liver function as well as his blood sugar. His immunizations are up to date. His cancer screening is up to date. Followup in one year or as needed. Regular anticipatory guidance was provided. - COMPLETE METABOLIC PANEL WITH GFR  2. Neoplasm of uncertain behavior Cryotherapy was applied to the lesion on the top of his head for approximately 20 seconds using liquid nitrogen. Wound care was discussed. He is to followup for a biopsy if the lesion persists.

## 2012-08-14 NOTE — Telephone Encounter (Signed)
He has not had pertussis shot then but I would not recommend another shot just for that.

## 2012-08-15 NOTE — Telephone Encounter (Signed)
lmtrc

## 2012-08-16 NOTE — Telephone Encounter (Signed)
LMTRC

## 2012-08-16 NOTE — Telephone Encounter (Signed)
Patient aware.

## 2012-08-22 ENCOUNTER — Other Ambulatory Visit: Payer: Self-pay

## 2012-08-22 ENCOUNTER — Encounter: Payer: Medicare Other | Admitting: Family Medicine

## 2012-09-16 ENCOUNTER — Other Ambulatory Visit: Payer: Self-pay | Admitting: Physician Assistant

## 2012-09-18 NOTE — Telephone Encounter (Signed)
Medication refilled per protocol. 

## 2012-11-01 ENCOUNTER — Ambulatory Visit: Payer: Medicare Other | Admitting: Family Medicine

## 2012-11-01 VITALS — BP 136/82

## 2012-11-01 DIAGNOSIS — Z0130 Encounter for examination of blood pressure without abnormal findings: Secondary | ICD-10-CM

## 2012-11-01 NOTE — Progress Notes (Signed)
Patient ID: Alex Martin, male   DOB: 1940/02/18, 72 y.o.   MRN: [id number] Pt came in for Flu shot and BP check nurse visit.  He requested the High Dose Senior Flu Vaccine which we do not have.  He said he will check with his local pharmacy.  His BP was 136/82

## 2012-11-22 ENCOUNTER — Other Ambulatory Visit: Payer: Self-pay

## 2012-12-18 ENCOUNTER — Other Ambulatory Visit: Payer: Self-pay | Admitting: Family Medicine

## 2013-01-24 ENCOUNTER — Ambulatory Visit (INDEPENDENT_AMBULATORY_CARE_PROVIDER_SITE_OTHER): Payer: Medicare Other | Admitting: Physician Assistant

## 2013-01-24 ENCOUNTER — Encounter: Payer: Self-pay | Admitting: Physician Assistant

## 2013-01-24 VITALS — BP 150/86 | HR 76 | Temp 98.3°F | Resp 18 | Wt 169.0 lb

## 2013-01-24 DIAGNOSIS — B9689 Other specified bacterial agents as the cause of diseases classified elsewhere: Secondary | ICD-10-CM

## 2013-01-24 DIAGNOSIS — J988 Other specified respiratory disorders: Secondary | ICD-10-CM

## 2013-01-24 DIAGNOSIS — B009 Herpesviral infection, unspecified: Secondary | ICD-10-CM

## 2013-01-24 DIAGNOSIS — A499 Bacterial infection, unspecified: Secondary | ICD-10-CM

## 2013-01-24 MED ORDER — VALACYCLOVIR HCL 1 G PO TABS: ORAL_TABLET | ORAL | Status: DC

## 2013-01-24 MED ORDER — AZITHROMYCIN 250 MG PO TABS: ORAL_TABLET | ORAL | Status: DC

## 2013-01-24 NOTE — Progress Notes (Signed)
Patient ID: Alex Martin MRN: [id number], DOB: 09/01/1940, 73 y.o. Date of Encounter: 01/24/2013, 11:29 AM    Chief Complaint:  Chief Complaint  Patient presents with  . persistant cough x 6 weeks    wnats to discuss pneumonia and Shingles shots and RX for cold sores     HPI: 73 y.o. year old white male reports that he has not been sick for 6 weeks as stated above. He says that he's been sick for about one to 2 weeks. Says he has some head and nasal congestion. He is blowing some mucus from his nose. Also does fill drainage down his throat. He is also having some chest congestion and cough productive of thick dark phlegm. He has had no known fever. No significant chills. No significant sore throat and no significant ear ache.  He has some cold sores on his lip. Has no medication to take for this.     Home Meds: See attached medication section for any medications that were entered at today's visit. The computer does not put those onto this list.The following list is a list of meds entered prior to today's visit.   Current Outpatient Prescriptions on File Prior to Visit  Medication Sig Dispense Refill  . losartan (COZAAR) 50 MG tablet TAKE 1 TABLET EVERY DAY  30 tablet  2  . VESICARE 5 MG tablet TAKE 1 TABLET BY MOUTH DAILY  30 tablet  12   No current facility-administered medications on file prior to visit.    Allergies:  Allergies  Allergen Reactions  . Shellfish Allergy     N&V AFTER EATING MUSSELS      Review of Systems: See HPI for pertinent ROS. All other ROS negative.    Physical Exam: Blood pressure 150/86, pulse 76, temperature 98.3 F (36.8 C), temperature source Oral, resp. rate 18, weight 169 lb (76.658 kg)., Body mass index is 26.06 kg/(m^2). General: WNWD WM. Appears in no acute distress. HEENT: Normocephalic, atraumatic, eyes without discharge, sclera non-icteric, nares are without discharge. Bilateral auditory canals clear, TM's are without  perforation, pearly grey and translucent with reflective cone of light bilaterally. Oral cavity moist, posterior pharynx without exudate, erythema, peritonsillar abscess. No sinus tenderness with percussion.  Neck: Supple. No thyromegaly. No lymphadenopathy. Lungs: Clear bilaterally to auscultation without wheezes, rales, or rhonchi. Breathing is unlabored. Lungs are clear throughout. Heart: Regular rhythm. No murmurs, rubs, or gallops. Msk:  Strength and tone normal for age. Extremities/Skin: He does have multiple cold sores on his lips which are resolving. Some of them have some scabs on them --some are even further healed than that. Neuro: Alert and oriented X 3. Moves all extremities spontaneously. Gait is normal. CNII-XII grossly in tact. Psych:  Responds to questions appropriately with a normal affect.     ASSESSMENT AND PLAN:  73 y.o. year old male with  1. Bacterial respiratory infection - azithromycin (ZITHROMAX) 250 MG tablet; Day 1: Take 2 daily.  Days 2-5: Take 1 daily.  Dispense: 6 tablet; Refill: 0 Recommend expectorant such as Mucinex DM. Can use decongestant if needed. Followup if symptoms do not resolve within one week after completion of antibiotic.  2. HSV-1 infection I discussed that the medication for this only works if you use the medication as soon as the symptoms begin.  Discussed that this current case is resolving and does not need any treatment. I will send prescription for him to have available --if has future recurrence, he is to start  med as soon as he develops symptoms.  - valACYclovir (VALTREX) 1000 MG tablet; Take 2  Every 12 hours for 2 doses  Dispense: 4 tablet; Refill: 5  As well he was discussing getting some immunizations today. I told him that he needs to wait until his current infection has resolved. I do not recommend giving immunizations while his immune system is putting off current infection. Once He is back to usual state of health to return to  clinic and get immunizations.  Signed, [address] [place], [place], [facility] 01/24/2013 11:29 AM

## 2013-01-31 ENCOUNTER — Telehealth: Payer: Self-pay | Admitting: Family Medicine

## 2013-01-31 NOTE — Telephone Encounter (Signed)
I am guessing he wants a refill on Zpak

## 2013-01-31 NOTE — Telephone Encounter (Signed)
Pharmacy is [facility] on [address] Pt is wanting to know if he can have a refill on his medication that he was given last week for his cold Call back number is [phone]

## 2013-02-01 ENCOUNTER — Encounter: Payer: Self-pay | Admitting: Family Medicine

## 2013-02-01 ENCOUNTER — Ambulatory Visit (INDEPENDENT_AMBULATORY_CARE_PROVIDER_SITE_OTHER): Payer: Medicare Other | Admitting: Family Medicine

## 2013-02-01 VITALS — BP 120/60 | HR 80 | Temp 97.3°F | Resp 16 | Ht 67.0 in | Wt 174.0 lb

## 2013-02-01 DIAGNOSIS — J019 Acute sinusitis, unspecified: Secondary | ICD-10-CM

## 2013-02-01 MED ORDER — AMOXICILLIN 875 MG PO TABS: 875.0000 mg | ORAL_TABLET | Freq: Two times a day (BID) | ORAL | Status: DC

## 2013-02-01 NOTE — Telephone Encounter (Signed)
Seen today in OV

## 2013-02-01 NOTE — Progress Notes (Signed)
Subjective:    Patient ID: Alex Martin, male    DOB: [birth year], 73 y.o.   MRN: [id number]  HPI  Patient has had sinus pressure and sinus drainage and postnasal drip now for over 2 weeks. This is creating a very sore throat. He has no cough. He has no fever. He denies any headaches. The main concern is chronic postnasal drip and sore throat. Past Medical History  Diagnosis Date  . Abdominal distension     UMBILICAL HERNIA-NO PAIN  . Blood in urine     MICROSCOPIC BLOODEVALUATED BY DR. NESI FRIDAY 02/11/11--URINE SAMPLE AND EXAM-- AND BLOOD SAMPLE AND TO BE SEEN AGAIN IN 6 MONTHS AND GIVEN VESICARE  FOR FREQUENCY AND URGENCY  . Hypertension   . Bleeding from the nose     SEVERAL EPISODES THAT REQUIRED TX IN ER--LAST EPISODE WAS APPROX 1 YR AGO  . GERD (gastroesophageal reflux disease)    Current Outpatient Prescriptions on File Prior to Visit  Medication Sig Dispense Refill  . losartan (COZAAR) 50 MG tablet TAKE 1 TABLET EVERY DAY  30 tablet  2  . Multiple Vitamin (MULTIVITAMIN) tablet Take 1 tablet by mouth daily.      . valACYclovir (VALTREX) 1000 MG tablet Take 2  Every 12 hours for 2 doses  4 tablet  5  . VESICARE 5 MG tablet TAKE 1 TABLET BY MOUTH DAILY  30 tablet  12   No current facility-administered medications on file prior to visit.   Allergies  Allergen Reactions  . Shellfish Allergy     N&V AFTER EATING MUSSELS   History   Social History  . Marital Status: Single    Spouse Name: N/A    Number of Children: N/A  . Years of Education: N/A   Occupational History  . Not on file.   Social History Main Topics  . Smoking status: Former Smoker -- 10 years    Quit date: 02/22/1992  . Smokeless tobacco: Never Used     Comment: QUIT SMOKING ABOUT 1994 - 1 PP WEEK  . Alcohol Use: 0.0 oz/week     Comment: 2 GLASSES WINE DAILY  . Drug Use: No  . Sexual Activity: Not on file   Other Topics Concern  . Not on file   Social History Narrative  . No narrative on  file     Review of Systems  All other systems reviewed and are negative.       Objective:   Physical Exam  Constitutional: He appears well-developed and well-nourished.  HENT:  Right Ear: Tympanic membrane, external ear and ear canal normal.  Left Ear: Tympanic membrane, external ear and ear canal normal.  Nose: Mucosal edema and rhinorrhea present. Right sinus exhibits no maxillary sinus tenderness and no frontal sinus tenderness. Left sinus exhibits no maxillary sinus tenderness and no frontal sinus tenderness.  Mouth/Throat: Oropharynx is clear and moist. No oropharyngeal exudate.  Neck: Neck supple.  Cardiovascular: Normal rate, regular rhythm and normal heart sounds.   No murmur heard. Pulmonary/Chest: Effort normal and breath sounds normal. No respiratory distress. He has no wheezes. He has no rales.  Lymphadenopathy:    He has no cervical adenopathy.          Assessment & Plan:  1. Sinusitis, acute Amoxicillin 875 mg by mouth twice a day for 10 days. I believe the patient has developed a secondary bacterial sinusitis after his upper respiratory infection. I believe is causing postnasal drip and is irritating his  throat. The patient cannot tolerate Sudafed due to hypertension. I did recommend Chloraseptic Spray as well as Coricidin HBP. - amoxicillin (AMOXIL) 875 MG tablet; Take 1 tablet (875 mg total) by mouth 2 (two) times daily.  Dispense: 20 tablet; Refill: 0

## 2013-02-01 NOTE — Telephone Encounter (Signed)
Forward to MBD, I would say no, med is in his system for 10 days.

## 2013-03-27 ENCOUNTER — Other Ambulatory Visit: Payer: Self-pay | Admitting: Family Medicine

## 2013-03-27 MED ORDER — LOSARTAN POTASSIUM 50 MG PO TABS: ORAL_TABLET | ORAL | Status: DC

## 2013-03-27 NOTE — Telephone Encounter (Signed)
Rx Refilled  

## 2013-08-01 ENCOUNTER — Encounter: Payer: Self-pay | Admitting: Physician Assistant

## 2013-08-01 ENCOUNTER — Ambulatory Visit (INDEPENDENT_AMBULATORY_CARE_PROVIDER_SITE_OTHER): Payer: Medicare Other | Admitting: Physician Assistant

## 2013-08-01 VITALS — BP 134/76 | HR 72 | Temp 98.3°F | Resp 16 | Ht 66.0 in | Wt 168.0 lb

## 2013-08-01 DIAGNOSIS — R1310 Dysphagia, unspecified: Secondary | ICD-10-CM

## 2013-08-01 NOTE — Progress Notes (Signed)
Patient ID: Alex Martin MRN: [id number], DOB: 1940/03/04, 73 y.o. Date of Encounter: @DATE @  Chief Complaint:  Chief Complaint  Patient presents with  . Difficulty Swallowing    x1 month- intermittent- states his throat becomes tight while he eats- feels like food gets stuck while he's eating  . Skin discoloration    x 6 months- L shoulder. upper arm- light brown patch of scaly skin- states area is tender  . 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 L hand edema    x1 week- states that there is discomfort when he tries to make a fist    HPI: 73 y.o. year old white male  presents with above concerns.   He states that for the past approximately one month he has had some difficulty swallowing solid foods. Seems to get stuck in his throat. Has no problem swallowing liquids. Has felt no lump or mass in his neck and has felt no sore throat or pain. Had no fevers or chills or significant weight loss.  States that he is left-handed. He uses his left hand with playing guitar, computer work, and he does sailing on his sailboat. All of this includes a lot of repetitive motion of the hand.   Past Medical History  Diagnosis Date  . Abdominal distension     UMBILICAL HERNIA-NO PAIN  . Blood in urine     MICROSCOPIC BLOODEVALUATED BY DR. NESI FRIDAY 02/11/11--URINE SAMPLE AND EXAM-- AND BLOOD SAMPLE AND TO BE SEEN AGAIN IN 6 MONTHS AND GIVEN VESICARE  FOR FREQUENCY AND URGENCY  . Hypertension   . Bleeding from the nose     SEVERAL EPISODES THAT REQUIRED TX IN ER--LAST EPISODE WAS APPROX 1 YR AGO  . GERD (gastroesophageal reflux disease)      Home Meds: Outpatient Prescriptions Prior to Visit  Medication Sig Dispense Refill  . losartan (COZAAR) 50 MG tablet TAKE 1 TABLET EVERY DAY  30 tablet  5  . valACYclovir (VALTREX) 1000 MG tablet Take 2  Every 12 hours for 2 doses  4 tablet  5  . VESICARE 5 MG tablet  TAKE 1 TABLET BY MOUTH DAILY  30 tablet  12  . Multiple Vitamin (MULTIVITAMIN) tablet Take 1 tablet by mouth daily.      Marland Kitchen amoxicillin (AMOXIL) 875 MG tablet Take 1 tablet (875 mg total) by mouth 2 (two) times daily.  20 tablet  0   No facility-administered medications prior to visit.    Allergies:  Allergies  Allergen Reactions  . Shellfish Allergy     N&V AFTER EATING MUSSELS    History   Social History  . Marital Status: Single    Spouse Name: N/A    Number of Children: N/A  . Years of Education: N/A   Occupational History  . Not on file.   Social History Main Topics  . Smoking status: Former Smoker -- 10 years    Quit date: 02/22/1992  . Smokeless tobacco: Never Used     Comment: QUIT SMOKING ABOUT 1994 - 1 PP WEEK  . Alcohol Use: 0.0 oz/week     Comment: 2 GLASSES WINE DAILY  . Drug Use: No  . Sexual Activity: Not on file   Other Topics Concern  . Not on file   Social History Narrative  . No narrative on file    Family History  Problem Relation  Age of Onset  . Heart disease Mother   . Heart disease Father   . Cancer Sister     pancreatic     Review of Systems:  See HPI for pertinent ROS. All other ROS negative.    Physical Exam: Blood pressure 134/76, pulse 72, temperature 98.3 F (36.8 C), temperature source Oral, resp. rate 16, height 5\' 6"  (1.676 m), weight 168 lb (76.204 kg)., Body mass index is 27.13 kg/(m^2). General: WNWD WM. Appears in no acute distress. Neck: Supple. No thyromegaly. No lymphadenopathy. No mass noted with palpation. No tenderness with palpation.  Lungs: Clear bilaterally to auscultation without wheezes, rales, or rhonchi. Breathing is unlabored. Heart: RRR with S1 S2. No murmurs, rubs, or gallops. Musculoskeletal:  Strength and tone normal for age. Left Hand: Inspection normal. Range of Motion normal.  Skin: Multiple small (approx 3-4 mm) hemangiomas on back Neuro: Alert and oriented X 3. Moves all extremities spontaneously.  Gait is normal. CNII-XII grossly in tact. Psych:  Responds to questions appropriately with a normal affect.     ASSESSMENT AND PLAN:  73 y.o. year old male with  1. Dysphagia, unspecified(787.20) - Ambulatory referral to Gastroenterology  2. Cherry Hemangiomas: Reassured him that this is what he is feeling on his back and that these are benign.  3. osteoarthritis of the left hand: Reassured him that his symptoms are consistent with osteoarthritis which is from repetitive wear and tear over the years. He defers any treatment.   [address] [place], [place], [facility] 08/01/2013 7:16 PM

## 2013-08-07 ENCOUNTER — Telehealth: Payer: Self-pay | Admitting: Family Medicine

## 2013-08-07 NOTE — Telephone Encounter (Signed)
Called and left message for pt to call back needing to schedule [facility] and CPE

## 2013-08-20 ENCOUNTER — Other Ambulatory Visit: Payer: Self-pay | Admitting: Gastroenterology

## 2013-08-20 DIAGNOSIS — R1310 Dysphagia, unspecified: Secondary | ICD-10-CM

## 2013-08-22 ENCOUNTER — Other Ambulatory Visit: Payer: Medicare Other

## 2013-08-27 ENCOUNTER — Ambulatory Visit
Admission: RE | Admit: 2013-08-27 | Discharge: 2013-08-27 | Disposition: A | Discharge status: Home / Self Care | Payer: Medicare Other | Source: Ambulatory Visit | Attending: Gastroenterology | Admitting: Gastroenterology

## 2013-08-27 DIAGNOSIS — R1310 Dysphagia, unspecified: Secondary | ICD-10-CM

## 2013-09-27 ENCOUNTER — Other Ambulatory Visit: Payer: Self-pay | Admitting: Family Medicine

## 2013-10-02 ENCOUNTER — Other Ambulatory Visit: Payer: Self-pay | Admitting: Family Medicine

## 2013-10-04 ENCOUNTER — Ambulatory Visit
Admission: RE | Admit: 2013-10-04 | Discharge: 2013-10-04 | Disposition: A | Discharge status: Home / Self Care | Payer: Medicare Other | Source: Ambulatory Visit | Attending: Family Medicine | Admitting: Family Medicine

## 2013-10-04 ENCOUNTER — Encounter: Payer: Self-pay | Admitting: Family Medicine

## 2013-10-04 ENCOUNTER — Ambulatory Visit (INDEPENDENT_AMBULATORY_CARE_PROVIDER_SITE_OTHER): Payer: Medicare Other | Admitting: Family Medicine

## 2013-10-04 VITALS — BP 164/80 | HR 64 | Temp 98.0°F | Resp 14 | Ht 67.0 in | Wt 165.0 lb

## 2013-10-04 DIAGNOSIS — R0789 Other chest pain: Secondary | ICD-10-CM

## 2013-10-04 NOTE — Progress Notes (Signed)
Subjective:    Patient ID: Alex Martin, male    DOB: 1941-01-12, 74 y.o.   MRN: [id number]  HPI  patient presents with 2 months of left-sided chest pain. The pain is located in the center of his pectoralis muscle.  There are no exacerbating or alleviating factors. The pain in the chest is constant. It is not worse with exercise. The patient is able to vigorously exercise on elliptical machine without any intensification of the chest pain. He denies any shortness of breath or dyspnea on exertion. He denies any cough or pleurisy or hemoptysis. He denies any weight loss or fever. He denies any worsening of acid reflux. There is no association of the pain with food or position changes. He denies any exacerbation of the pain with stress or anxiety. The pain can be made worse by palpation in the left chest over the anterior fourth and fifth ribs.  EKG today shows normal sinus rhythm at 59 beats per minute with normal intervals and normal axis. There is nonspecific changes in lead aVL but otherwise there is no evidence of ischemia or infarction. Patient's blood pressure today is elevated however he discontinued his blood pressure medications approximately one week ago. Past Medical History  Diagnosis Date  . Abdominal distension     UMBILICAL HERNIA-NO PAIN  . Blood in urine     MICROSCOPIC BLOODEVALUATED BY DR. NESI FRIDAY 02/11/11--URINE SAMPLE AND EXAM-- AND BLOOD SAMPLE AND TO BE SEEN AGAIN IN 6 MONTHS AND GIVEN VESICARE  FOR FREQUENCY AND URGENCY  . Hypertension   . Bleeding from the nose     SEVERAL EPISODES THAT REQUIRED TX IN ER--LAST EPISODE WAS APPROX 1 YR AGO  . GERD (gastroesophageal reflux disease)    Past Surgical History  Procedure Laterality Date  . Vasectomy  1976  . Hernia repair  1959    [facility]  . Umbilical hernia repair  02/15/2011    Procedure: HERNIA REPAIR UMBILICAL ADULT;  Surgeon: Odis Hollingshead, MD;  Location: [facility];  Service: General;  Laterality: N/A;  umbilical  hernia repair with mesh   Current Outpatient Prescriptions on File Prior to Visit  Medication Sig Dispense Refill  . losartan (COZAAR) 50 MG tablet TAKE 1 TABLET EVERY DAY  30 tablet  5  . Multiple Vitamin (MULTIVITAMIN) tablet Take 1 tablet by mouth daily.      . VESICARE 5 MG tablet TAKE 1 TABLET BY MOUTH DAILY  30 tablet  11   No current facility-administered medications on file prior to visit.   Allergies  Allergen Reactions  . Shellfish Allergy     N&V AFTER EATING MUSSELS   History   Social History  . Marital Status: Single    Spouse Name: N/A    Number of Children: N/A  . Years of Education: N/A   Occupational History  . Not on file.   Social History Main Topics  . Smoking status: Former Smoker -- 10 years    Quit date: 02/22/1992  . Smokeless tobacco: Never Used     Comment: QUIT SMOKING ABOUT 1994 - 1 PP WEEK  . Alcohol Use: 0.0 oz/week     Comment: 2 GLASSES WINE DAILY  . Drug Use: No  . Sexual Activity: Not on file   Other Topics Concern  . Not on file   Social History Narrative  . No narrative on file      Review of Systems  All other systems reviewed and are negative.  Objective:   Physical Exam  Vitals reviewed. Constitutional: He appears well-developed and well-nourished.  HENT:  Mouth/Throat: No oropharyngeal exudate.  Neck: Neck supple. No JVD present.  Cardiovascular: Normal rate, regular rhythm and normal heart sounds.  Exam reveals no gallop and no friction rub.   No murmur heard. Pulmonary/Chest: Effort normal and breath sounds normal. No respiratory distress. He has no wheezes. He has no rales. He exhibits tenderness.  Abdominal: Soft. Bowel sounds are normal. He exhibits no distension. There is no tenderness. There is no rebound and no guarding.  Musculoskeletal: He exhibits no edema.  Lymphadenopathy:    He has no cervical adenopathy.          Assessment & Plan:  Other chest pain - Plan: DG Chest 2 View   After a  thorough history and physical, his symptoms do not sound to be cardiac, pulmonary, or gastrointestinal in origin. I like to obtain a chest x-ray to evaluate for any abnormalities in the lungs or ribs. The pain could likely be musculoskeletal in nature. Consider a CT scan of the chest if chest x-ray is normal and pain persists. If the Patient develops any angina or shortness of breath I recommended he go to the hospital immediately.

## 2013-10-07 ENCOUNTER — Other Ambulatory Visit: Payer: Self-pay | Admitting: Family Medicine

## 2013-10-07 DIAGNOSIS — R079 Chest pain, unspecified: Secondary | ICD-10-CM

## 2013-10-10 ENCOUNTER — Ambulatory Visit: Payer: Medicare Other | Admitting: Family Medicine

## 2013-10-10 ENCOUNTER — Ambulatory Visit
Admission: RE | Admit: 2013-10-10 | Discharge: 2013-10-10 | Disposition: A | Discharge status: Home / Self Care | Payer: Medicare Other | Source: Ambulatory Visit | Attending: Family Medicine | Admitting: Family Medicine

## 2013-10-10 VITALS — BP 126/70

## 2013-10-10 DIAGNOSIS — Z0130 Encounter for examination of blood pressure without abnormal findings: Secondary | ICD-10-CM

## 2013-10-10 DIAGNOSIS — R079 Chest pain, unspecified: Secondary | ICD-10-CM

## 2013-11-14 ENCOUNTER — Other Ambulatory Visit: Payer: Self-pay | Admitting: *Deleted

## 2013-11-14 MED ORDER — LOSARTAN POTASSIUM 50 MG PO TABS: ORAL_TABLET | ORAL | Status: DC

## 2013-11-14 MED ORDER — SOLIFENACIN SUCCINATE 5 MG PO TABS
ORAL_TABLET | ORAL | Status: DC
Start: 2013-11-14 — End: 2014-05-09

## 2013-11-14 NOTE — Telephone Encounter (Signed)
Received fax requesting refill on Cozaar and Vesicare.   Refill appropriate and filled per protocol.

## 2014-05-02 ENCOUNTER — Ambulatory Visit (INDEPENDENT_AMBULATORY_CARE_PROVIDER_SITE_OTHER): Payer: Medicare Other | Admitting: Family Medicine

## 2014-05-02 ENCOUNTER — Encounter: Payer: Self-pay | Admitting: Family Medicine

## 2014-05-02 VITALS — BP 130/68 | HR 64 | Temp 98.2°F | Resp 12 | Ht 67.0 in | Wt 167.0 lb

## 2014-05-02 DIAGNOSIS — W57XXXA Bitten or stung by nonvenomous insect and other nonvenomous arthropods, initial encounter: Secondary | ICD-10-CM | POA: Diagnosis not present

## 2014-05-02 DIAGNOSIS — N508 Other specified disorders of male genital organs: Secondary | ICD-10-CM | POA: Diagnosis not present

## 2014-05-02 DIAGNOSIS — T148 Other injury of unspecified body region: Secondary | ICD-10-CM

## 2014-05-02 DIAGNOSIS — N5089 Other specified disorders of the male genital organs: Secondary | ICD-10-CM

## 2014-05-02 MED ORDER — DOXYCYCLINE HYCLATE 100 MG PO TABS: 100.0000 mg | ORAL_TABLET | Freq: Two times a day (BID) | ORAL | Status: DC

## 2014-05-02 NOTE — Progress Notes (Signed)
Patient ID: Alex Martin, male   DOB: [birth year], 74 y.o.   MRN: [id number]   Subjective:    Patient ID: Alex Martin, male    DOB: [birth year], 74 y.o.   MRN: [id number]  Patient presents for Inguinal Edema  Patient here because of tick bite. He pulled a tick off of his scrotum yesterday since then he's had some mild discomfort and redness and swelling in his right inguinal region as well as the scrotum. He denies any fever or significant pain. He is urinating as normal. He states that he often gets severe reactions to take bites.   Review Of Systems:  GEN- denies fatigue, fever, weight loss,weakness, recent illness HEENT- denies eye drainage, change in vision, nasal discharge, CVS- denies chest pain, palpitations RESP- denies SOB, cough, wheeze ABD- denies N/V, change in stools, abd pain GU- denies dysuria, hematuria, dribbling, incontinence Neuro- denies headache, dizziness, syncope, seizure activity       Objective:    BP 130/68 mmHg  Pulse 64  Temp(Src) 98.2 F (36.8 C) (Oral)  Resp 12  Ht 5\' 7"  (1.702 m)  Wt 167 lb (75.751 kg)  BMI 26.15 kg/m2 GEN- NAD, alert and oriented x3 GU- Mild swelling of scortal sac, erythema with induration at area of tick bite, +swollen lymph node right inguiinal region, with erythema and mild swelling of right mons pubis         Assessment & Plan:      Problem List Items Addressed This Visit    None    Visit Diagnoses    Tick bite    -  Primary    siginifcant swelling, with enlarged reactive lymph node, will start oral antibiotics, he looks very well, no fever, no pain, given red flags for ER this weekend    Scrotal swelling        per above if no improvement by Monday will get Ultrasound       Note: This dictation was prepared with Dragon dictation along with smaller phrase technology. Any transcriptional errors that result from this process are unintentional.

## 2014-05-02 NOTE — Patient Instructions (Signed)
Take antibiotics Use ice  We will f/u by phone

## 2014-05-04 ENCOUNTER — Encounter: Payer: Self-pay | Admitting: Family Medicine

## 2014-05-05 ENCOUNTER — Telehealth: Payer: Self-pay | Admitting: Family Medicine

## 2014-05-05 NOTE — Telephone Encounter (Signed)
-----   Message from Kirklin, LPN sent at 07/20/5007 12:44 PM EDT ----- Regarding: RE: Call pt see if infection/swelling has gone down in his groin Call placed to patient.   States that swelling has decreased in groin, but one lymph node remains slightly swollen.   States that he will continue to monitor and call on Wed with report.  ----- Message -----    From: Alycia Rossetti, MD    Sent: 05/04/2014   8:55 PM      To: Eden Lathe Six, LPN Subject: Call pt see if infection/swelling has gone d#

## 2014-05-09 ENCOUNTER — Encounter: Payer: Self-pay | Admitting: Family Medicine

## 2014-05-09 ENCOUNTER — Ambulatory Visit (INDEPENDENT_AMBULATORY_CARE_PROVIDER_SITE_OTHER): Payer: Medicare Other | Admitting: Family Medicine

## 2014-05-09 VITALS — BP 132/68 | HR 74 | Temp 98.3°F | Resp 16 | Ht 67.0 in | Wt 167.0 lb

## 2014-05-09 DIAGNOSIS — W57XXXA Bitten or stung by nonvenomous insect and other nonvenomous arthropods, initial encounter: Secondary | ICD-10-CM

## 2014-05-09 DIAGNOSIS — I10 Essential (primary) hypertension: Secondary | ICD-10-CM

## 2014-05-09 DIAGNOSIS — K219 Gastro-esophageal reflux disease without esophagitis: Secondary | ICD-10-CM

## 2014-05-09 DIAGNOSIS — T148 Other injury of unspecified body region: Secondary | ICD-10-CM | POA: Diagnosis not present

## 2014-05-09 MED ORDER — LOSARTAN POTASSIUM 50 MG PO TABS: ORAL_TABLET | ORAL | Status: DC

## 2014-05-09 MED ORDER — NEXIUM 40 MG PO CPDR: 40.0000 mg | DELAYED_RELEASE_CAPSULE | Freq: Every day | ORAL | Status: DC

## 2014-05-09 MED ORDER — SOLIFENACIN SUCCINATE 5 MG PO TABS: ORAL_TABLET | ORAL | Status: DC

## 2014-05-09 NOTE — Progress Notes (Signed)
Patient ID: Alex Martin, male   DOB: 09-09-1940, 74 y.o.   MRN: [id number]   Subjective:    Patient ID: Alex Martin, male    DOB: [birth year], 74 y.o.   MRN: [id number]  Patient presents for F/U Tick Bite  Pt here to f/u tick bite scrotal swelling and LAD. Seen 1 week ago after being bit by a tick, swelling, redness now resolved. No fever, no N/V no joint pain, he has 3 days left on antibiotics. He otherwise feels well. Due for CPE and fasting labs  He does not Omeprazole not covered by insurance would like alternative, it does help control GERD symptoms   Review Of Systems:  GEN- denies fatigue, fever, weight loss,weakness, recent illness HEENT- denies eye drainage, change in vision, nasal discharge, CVS- denies chest pain, palpitations RESP- denies SOB, cough, wheeze ABD- denies N/V, change in stools, abd pain GU- denies dysuria, hematuria, dribbling, incontinence MSK- denies joint pain, muscle aches, injury Neuro- denies headache, dizziness, syncope, seizure activity       Objective:    BP 132/68 mmHg  Pulse 74  Temp(Src) 98.3 F (36.8 C) (Oral)  Resp 16  Ht 5\' 7"  (1.702 m)  Wt 167 lb (75.751 kg)  BMI 26.15 kg/m2 GEN- NAD, alert and oriented x3 Skin- scrotum,no erythema GU- no inguinal LAD, NT, no swelling of mons pubis or scrotum        Assessment & Plan:      Problem List Items Addressed This Visit    GERD (gastroesophageal reflux disease)    Change to nexium, per pt omeprazole not preferred      Relevant Medications   NEXIUM 40 MG capsule   Essential hypertension    Well controlled, meds refiled, he will return for CPE and fasting labs      Relevant Medications   losartan (COZAAR) 50 MG tablet    Other Visit Diagnoses    Tick bite    -  Primary    Scrotal swelling and infection resolved, LAD resolved, complete last 3 days of antibiotics       Note: This dictation was prepared with Dragon dictation along with smaller phrase technology. Any  transcriptional errors that result from this process are unintentional.

## 2014-05-09 NOTE — Assessment & Plan Note (Signed)
Change to nexium, per pt omeprazole not preferred

## 2014-05-09 NOTE — Patient Instructions (Signed)
Complete antibiotics  F/U 3 MONTHS for Physical- Dr. Dennard Schaumann

## 2014-05-09 NOTE — Assessment & Plan Note (Signed)
Well controlled, meds refiled, he will return for CPE and fasting labs

## 2014-06-18 ENCOUNTER — Encounter: Payer: Self-pay | Admitting: Medical

## 2014-06-26 ENCOUNTER — Encounter: Payer: Self-pay | Admitting: *Deleted

## 2014-06-26 NOTE — Telephone Encounter (Signed)
This encounter was created in error - please disregard.

## 2014-07-03 ENCOUNTER — Telehealth: Payer: Self-pay | Admitting: Family Medicine

## 2014-07-03 MED ORDER — VALACYCLOVIR HCL 1 G PO TABS: 2000.0000 mg | ORAL_TABLET | Freq: Two times a day (BID) | ORAL | Status: DC

## 2014-07-03 NOTE — Telephone Encounter (Signed)
Valtrex 2 g pobid x 1 day 

## 2014-07-03 NOTE — Telephone Encounter (Signed)
Medication called/sent to requested pharmacy  

## 2014-07-03 NOTE — Telephone Encounter (Signed)
Patient calling to see if we can send rx for his cold sore that is coming on, the rx needs to be [facility] into [facility] [place] # [phone]  [facility] [employer] [facility]  Details & Directions  [address] [place], [place] [address]  [phone]

## 2014-07-04 ENCOUNTER — Encounter: Payer: Self-pay | Admitting: Family Medicine

## 2014-07-04 ENCOUNTER — Ambulatory Visit (INDEPENDENT_AMBULATORY_CARE_PROVIDER_SITE_OTHER): Payer: Medicare Other | Admitting: Family Medicine

## 2014-07-04 VITALS — BP 122/78 | HR 78 | Temp 98.0°F | Resp 16 | Ht 67.0 in | Wt 165.0 lb

## 2014-07-04 DIAGNOSIS — J029 Acute pharyngitis, unspecified: Secondary | ICD-10-CM | POA: Diagnosis not present

## 2014-07-04 DIAGNOSIS — D485 Neoplasm of uncertain behavior of skin: Secondary | ICD-10-CM | POA: Diagnosis not present

## 2014-07-04 NOTE — Progress Notes (Signed)
Subjective:    Patient ID: Alex Martin, male    DOB: [birth year], 74 y.o.   MRN: [id number]  HPI  Patient is a 74 year old white male who has had postnasal drip, sinus drainage and a sore throat for the last 2 or 3 days. He denies any fevers or chills. He denies any cough. He denies any otalgia. He denies any nausea or vomiting or diarrhea. On examination he has some mild mucosal edema in his nostrils and some mucus in the back of his nasopharynx. He also has postnasal drainage visible in his posterior oropharynx. There is no erythema in that area. The patient has no tender lymphadenopathy in the anterior cervical chain. He does have a 1 cm erythematous scaly patch on his right cheek that appears to be an actinic keratoses. He states that this is been there for approximately 2 or 3 months. He is requesting treatment of that at this time Past Medical History  Diagnosis Date  . Abdominal distension     UMBILICAL HERNIA-NO PAIN  . Blood in urine     MICROSCOPIC BLOODEVALUATED BY DR. NESI FRIDAY 02/11/11--URINE SAMPLE AND EXAM-- AND BLOOD SAMPLE AND TO BE SEEN AGAIN IN 6 MONTHS AND GIVEN VESICARE  FOR FREQUENCY AND URGENCY  . Hypertension   . Bleeding from the nose     SEVERAL EPISODES THAT REQUIRED TX IN ER--LAST EPISODE WAS APPROX 1 YR AGO  . GERD (gastroesophageal reflux disease)    Past Surgical History  Procedure Laterality Date  . Vasectomy  1976  . Hernia repair  1959    [facility]  . Umbilical hernia repair  02/15/2011    Procedure: HERNIA REPAIR UMBILICAL ADULT;  Surgeon: Odis Hollingshead, MD;  Location: [facility];  Service: General;  Laterality: N/A;  umbilical hernia repair with mesh   Current Outpatient Prescriptions on File Prior to Visit  Medication Sig Dispense Refill  . aspirin 81 MG chewable tablet Chew 81 mg by mouth daily.    Marland Kitchen losartan (COZAAR) 50 MG tablet TAKE 1 TABLET EVERY DAY 30 tablet 5  . Multiple Vitamin (MULTIVITAMIN) tablet Take 1 tablet by mouth daily.    Marland Kitchen  NEXIUM 40 MG capsule Take 1 capsule (40 mg total) by mouth daily. 30 capsule 6  . solifenacin (VESICARE) 5 MG tablet TAKE 1 TABLET BY MOUTH DAILY 30 tablet 5  . valACYclovir (VALTREX) 1000 MG tablet Take 2 tablets (2,000 mg total) by mouth 2 (two) times daily. X 1 day 8 tablet 3   No current facility-administered medications on file prior to visit.   Allergies  Allergen Reactions  . Shellfish Allergy     N&V AFTER EATING MUSSELS   History   Social History  . Marital Status: Single    Spouse Name: N/A  . Number of Children: N/A  . Years of Education: N/A   Occupational History  . Not on file.   Social History Main Topics  . Smoking status: Former Smoker -- 10 years    Quit date: 02/22/1992  . Smokeless tobacco: Never Used     Comment: QUIT SMOKING ABOUT 1994 - 1 PP WEEK  . Alcohol Use: 0.0 oz/week     Comment: 2 GLASSES WINE DAILY  . Drug Use: No  . Sexual Activity: Not on file   Other Topics Concern  . Not on file   Social History Narrative     Review of Systems  All other systems reviewed and are negative.  Objective:   Physical Exam  Constitutional: He appears well-developed and well-nourished.  HENT:  Head: Normocephalic and atraumatic.  Right Ear: External ear normal.  Left Ear: External ear normal.  Nose: Nose normal.  Mouth/Throat: Oropharynx is clear and moist. No oropharyngeal exudate.  Eyes: Conjunctivae are normal.  Cardiovascular: Normal rate, regular rhythm and normal heart sounds.   No murmur heard. Pulmonary/Chest: Effort normal and breath sounds normal. No respiratory distress. He has no wheezes. He has no rales.  Lymphadenopathy:    He has no cervical adenopathy.  Skin: Rash noted. There is erythema.  Vitals reviewed.         Assessment & Plan:  Viral pharyngitis  Neoplasm of uncertain behavior of skin  Patient appears to have a viral pharyngitis. I recommended tincture of time and symptomatic care only. I anticipate  spontaneous resolution by Monday or Tuesday of next week. The lesion on his right cheek was treated with liquid nitrogen cryotherapy for a total of 30 seconds. If the lesion persist, I would recommend a shave biopsy.

## 2014-08-21 ENCOUNTER — Ambulatory Visit (INDEPENDENT_AMBULATORY_CARE_PROVIDER_SITE_OTHER): Payer: Medicare Other | Admitting: Family Medicine

## 2014-08-21 ENCOUNTER — Encounter: Payer: Self-pay | Admitting: Family Medicine

## 2014-08-21 VITALS — BP 126/72 | HR 78 | Temp 97.9°F | Resp 14 | Ht 67.0 in | Wt 166.0 lb

## 2014-08-21 DIAGNOSIS — Z719 Counseling, unspecified: Secondary | ICD-10-CM

## 2014-08-21 NOTE — Progress Notes (Signed)
Subjective:    Patient ID: Alex Martin, male    DOB: 1940/12/04, 74 y.o.   MRN: [id number]  HPI Patient is here today to discuss medical plan of care. Patient would like to be made a DO NOT RESUSCITATE. He states that his heart stops and if he stops breathing he does not want CPR performed, intubation performed, mechanical ventilation or cardioversion if indicated.  We then proceeded to his MOST form.  Patient states that he has a pulse and his breathing, he would like the full scope of treatment at least for a trial period of time. For instance if the patient has pneumonia and there is a chance that he could recover, the patient would like intubation and full scope of care. However if he ever gets to a situation where he is on an end-stage of a disease, there is no hope of improvement, he does not want to be in a persistent vegetative state requiring mechanical ventilation, feeding tube, or IV fluids. Past Medical History  Diagnosis Date  . Abdominal distension     UMBILICAL HERNIA-NO PAIN  . Blood in urine     MICROSCOPIC BLOODEVALUATED BY DR. NESI FRIDAY 02/11/11--URINE SAMPLE AND EXAM-- AND BLOOD SAMPLE AND TO BE SEEN AGAIN IN 6 MONTHS AND GIVEN VESICARE  FOR FREQUENCY AND URGENCY  . Hypertension   . Bleeding from the nose     SEVERAL EPISODES THAT REQUIRED TX IN ER--LAST EPISODE WAS APPROX 1 YR AGO  . GERD (gastroesophageal reflux disease)    Past Surgical History  Procedure Laterality Date  . Vasectomy  1976  . Hernia repair  1959    [facility]  . Umbilical hernia repair  02/15/2011    Procedure: HERNIA REPAIR UMBILICAL ADULT;  Surgeon: Odis Hollingshead, MD;  Location: [facility];  Service: General;  Laterality: N/A;  umbilical hernia repair with mesh   Current Outpatient Prescriptions on File Prior to Visit  Medication Sig Dispense Refill  . aspirin 81 MG chewable tablet Chew 81 mg by mouth daily.    Marland Kitchen losartan (COZAAR) 50 MG tablet TAKE 1 TABLET EVERY DAY 30 tablet 5  . Multiple  Vitamin (MULTIVITAMIN) tablet Take 1 tablet by mouth daily.    Marland Kitchen NEXIUM 40 MG capsule Take 1 capsule (40 mg total) by mouth daily. 30 capsule 6  . solifenacin (VESICARE) 5 MG tablet TAKE 1 TABLET BY MOUTH DAILY 30 tablet 5  . valACYclovir (VALTREX) 1000 MG tablet Take 2 tablets (2,000 mg total) by mouth 2 (two) times daily. X 1 day 8 tablet 3   No current facility-administered medications on file prior to visit.   Allergies  Allergen Reactions  . Shellfish Allergy     N&V AFTER EATING MUSSELS   History   Social History  . Marital Status: Single    Spouse Name: N/A  . Number of Children: N/A  . Years of Education: N/A   Occupational History  . Not on file.   Social History Main Topics  . Smoking status: Former Smoker -- 10 years    Quit date: 02/22/1992  . Smokeless tobacco: Never Used     Comment: QUIT SMOKING ABOUT 1994 - 1 PP WEEK  . Alcohol Use: 0.0 oz/week     Comment: 2 GLASSES WINE DAILY  . Drug Use: No  . Sexual Activity: Not on file   Other Topics Concern  . Not on file   Social History Narrative      Review of Systems  All other systems reviewed and are negative.      Objective:   Physical Exam  Cardiovascular: Normal rate, regular rhythm and normal heart sounds.   Pulmonary/Chest: Effort normal and breath sounds normal.  Vitals reviewed.         Assessment & Plan:  Consultation without specific complaint  Patient will be made a DNR. However according to his MOST  Form, he would like full scope of treatment at least for a trial period of time in the event that there may be a reversible condition. After a failure to improve after a trial period of time such as one week, he does not want prolonged mechanical ventilation, feeding tube, or IV fluids

## 2014-09-22 ENCOUNTER — Other Ambulatory Visit: Payer: Self-pay | Admitting: Family Medicine

## 2014-09-25 ENCOUNTER — Other Ambulatory Visit: Payer: Self-pay | Admitting: Family Medicine

## 2014-10-29 ENCOUNTER — Ambulatory Visit (INDEPENDENT_AMBULATORY_CARE_PROVIDER_SITE_OTHER): Payer: Medicare Other | Admitting: *Deleted

## 2014-10-29 DIAGNOSIS — Z23 Encounter for immunization: Secondary | ICD-10-CM

## 2014-11-11 ENCOUNTER — Ambulatory Visit (INDEPENDENT_AMBULATORY_CARE_PROVIDER_SITE_OTHER): Payer: Medicare Other | Admitting: Family Medicine

## 2014-11-11 ENCOUNTER — Encounter: Payer: Self-pay | Admitting: Family Medicine

## 2014-11-11 VITALS — BP 132/74 | HR 76 | Temp 98.4°F | Resp 16 | Ht 67.0 in | Wt 174.0 lb

## 2014-11-11 DIAGNOSIS — L570 Actinic keratosis: Secondary | ICD-10-CM | POA: Diagnosis not present

## 2014-11-11 DIAGNOSIS — D099 Carcinoma in situ, unspecified: Secondary | ICD-10-CM

## 2014-11-11 NOTE — Progress Notes (Signed)
Subjective:    Patient ID: Alex Martin, male    DOB: 11-Jun-1940, 74 y.o.   MRN: [id number]  HPI  Patient has numerous actinic keratoses on his scalp. However there is a lesion on his right temple which is approximate 7 mm in diameter that has a hard hyperkeratotic center on an erythematous base which appears to be a squamous cell carcinoma in situ. He also has 2 erythematous scaly papules on his left temple which are approximately 5 mm in diameter. Past Medical History  Diagnosis Date  . Abdominal distension     UMBILICAL HERNIA-NO PAIN  . Blood in urine     MICROSCOPIC BLOODEVALUATED BY DR. NESI FRIDAY 02/11/11--URINE SAMPLE AND EXAM-- AND BLOOD SAMPLE AND TO BE SEEN AGAIN IN 6 MONTHS AND GIVEN VESICARE  FOR FREQUENCY AND URGENCY  . Hypertension   . Bleeding from the nose     SEVERAL EPISODES THAT REQUIRED TX IN ER--LAST EPISODE WAS APPROX 1 YR AGO  . GERD (gastroesophageal reflux disease)    Past Surgical History  Procedure Laterality Date  . Vasectomy  1976  . Hernia repair  1959    [facility]  . Umbilical hernia repair  02/15/2011    Procedure: HERNIA REPAIR UMBILICAL ADULT;  Surgeon: Odis Hollingshead, MD;  Location: [facility];  Service: General;  Laterality: N/A;  umbilical hernia repair with mesh   Current Outpatient Prescriptions on File Prior to Visit  Medication Sig Dispense Refill  . aspirin 81 MG chewable tablet Chew 81 mg by mouth daily.    Marland Kitchen losartan (COZAAR) 50 MG tablet TAKE 1 TABLET EVERY DAY 30 tablet 5  . Multiple Vitamin (MULTIVITAMIN) tablet Take 1 tablet by mouth daily.    Marland Kitchen NEXIUM 40 MG capsule Take 1 capsule (40 mg total) by mouth daily. 30 capsule 6  . valACYclovir (VALTREX) 1000 MG tablet Take 2 tablets (2,000 mg total) by mouth 2 (two) times daily. X 1 day 8 tablet 3  . VESICARE 5 MG tablet TAKE 1 TABLET BY MOUTH DAILY 30 tablet 11   No current facility-administered medications on file prior to visit.   Allergies  Allergen Reactions  . Shellfish Allergy       N&V AFTER EATING MUSSELS   Social History   Social History  . Marital Status: Single    Spouse Name: N/A  . Number of Children: N/A  . Years of Education: N/A   Occupational History  . Not on file.   Social History Main Topics  . Smoking status: Former Smoker -- 10 years    Quit date: 02/22/1992  . Smokeless tobacco: Never Used     Comment: QUIT SMOKING ABOUT 1994 - 1 PP WEEK  . Alcohol Use: 0.0 oz/week     Comment: 2 GLASSES WINE DAILY  . Drug Use: No  . Sexual Activity: Not on file   Other Topics Concern  . Not on file   Social History Narrative     Review of Systems  All other systems reviewed and are negative.      Objective:   Physical Exam  Cardiovascular: Normal rate and regular rhythm.   Pulmonary/Chest: Effort normal and breath sounds normal.  Vitals reviewed.  please see the description in the history of present illness        Assessment & Plan:  Actinic keratosis  Squamous cell carcinoma in situ  All 3 lesions on his head or actinic keratoses and I believe the one on his right temple  has most likely progressed into a squamous cell carcinoma in situ.  All 3 lesions were treated with cryotherapy using liquid nitrogen for a total of 30 seconds each. Consider efudex for his entire scalp.  However the patient defers that for now because he is going to [place] and will be in direct sunlight for the next month.  Return if lesions persist.

## 2014-11-25 ENCOUNTER — Other Ambulatory Visit: Payer: Self-pay | Admitting: Family Medicine

## 2014-11-25 NOTE — Telephone Encounter (Signed)
Refill appropriate and filled per protocol. 

## 2015-02-16 ENCOUNTER — Ambulatory Visit (INDEPENDENT_AMBULATORY_CARE_PROVIDER_SITE_OTHER): Payer: Medicare Other | Admitting: Physician Assistant

## 2015-02-16 ENCOUNTER — Encounter: Payer: Self-pay | Admitting: Physician Assistant

## 2015-02-16 VITALS — BP 156/76 | HR 76 | Temp 97.6°F | Resp 18 | Wt 174.0 lb

## 2015-02-16 DIAGNOSIS — R202 Paresthesia of skin: Secondary | ICD-10-CM

## 2015-02-16 DIAGNOSIS — M5010 Cervical disc disorder with radiculopathy, unspecified cervical region: Secondary | ICD-10-CM

## 2015-02-16 DIAGNOSIS — M25522 Pain in left elbow: Secondary | ICD-10-CM | POA: Diagnosis not present

## 2015-02-16 MED ORDER — PREDNISONE 20 MG PO TABS: ORAL_TABLET | ORAL | Status: DC

## 2015-02-16 NOTE — Progress Notes (Signed)
Patient ID: Alex Martin MRN: [id number], DOB: 04-Nov-1940, 75 y.o. Date of Encounter: @DATE @  Chief Complaint:  Chief Complaint  Patient presents with  . left arm numbness    x 1 week    HPI: 75 y.o. year old white male  presents with above.  States that he has only been having the symptoms for the past one- two weeks. States that he feels pain in his left elbow. Points to an area just above the olecranon process as the area of pain. States that he has had no known trauma or injury to the elbow or arm.  Discussed his usual activity level and activity over the past 2 weeks. He is retired. Says that he does play golf. 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  Says that he especially noticed symptoms when he shaves in the morning.  Says that his left fingers have felt numb all the time for the past 2 weeks.  Says that he has felt no discomfort in the left neck or left upper arm. No pain in that region and no numbness or tingling in those regions.  Past Medical History  Diagnosis Date  . Abdominal distension     UMBILICAL HERNIA-NO PAIN  . Blood in urine     MICROSCOPIC BLOODEVALUATED BY DR. NESI FRIDAY 02/11/11--URINE SAMPLE AND EXAM-- AND BLOOD SAMPLE AND TO BE SEEN AGAIN IN 6 MONTHS AND GIVEN VESICARE  FOR FREQUENCY AND URGENCY  . Hypertension   . Bleeding from the nose     SEVERAL EPISODES THAT REQUIRED TX IN ER--LAST EPISODE WAS APPROX 1 YR AGO  . GERD (gastroesophageal reflux disease)      Home Meds: Outpatient Prescriptions Prior to Visit  Medication Sig Dispense Refill  . aspirin 81 MG chewable tablet Chew 81 mg by mouth daily.    Marland Kitchen latanoprost (XALATAN) 0.005 % ophthalmic solution 1 DROP IN BOTH EYES AT BEDTIME  0  . losartan (COZAAR) 50 MG tablet TAKE 1 TABLET EVERY DAY 30 tablet 3  . Multiple Vitamin (MULTIVITAMIN) tablet Take 1 tablet by mouth daily.    Marland Kitchen NEXIUM 40 MG capsule Take 1 capsule (40 mg total) by mouth daily. 30 capsule 6  . VESICARE 5  MG tablet TAKE 1 TABLET BY MOUTH DAILY 30 tablet 11  . valACYclovir (VALTREX) 1000 MG tablet Take 2 tablets (2,000 mg total) by mouth 2 (two) times daily. X 1 day 8 tablet 3   No facility-administered medications prior to visit.    Allergies:  Allergies  Allergen Reactions  . Shellfish Allergy     N&V AFTER EATING MUSSELS    Social History   Social History  . Marital Status: Single    Spouse Name: N/A  . Number of Children: N/A  . Years of Education: N/A   Occupational History  . Not on file.   Social History Main Topics  . Smoking status: Former Smoker -- 10 years    Quit date: 02/22/1992  . Smokeless tobacco: Never Used     Comment: QUIT SMOKING ABOUT 1994 - 1 PP WEEK  . Alcohol Use: 0.0 oz/week     Comment: 2 GLASSES WINE DAILY  . Drug Use: No  . Sexual Activity: Not on file   Other Topics Concern  . Not on file   Social History Narrative    Family History  Problem Relation Age of Onset  . Heart disease Mother   . Heart disease Father   . Cancer  Sister     pancreatic     Review of Systems:  See HPI for pertinent ROS. All other ROS negative.    Physical Exam: Blood pressure 156/76, pulse 76, temperature 97.6 F (36.4 C), temperature source Oral, resp. rate 18, weight 174 lb (78.926 kg)., Body mass index is 27.25 kg/(m^2). General: WNWD WM. Appears in no acute distress. Neck: Supple. No thyromegaly. No lymphadenopathy. Lungs: Clear bilaterally to auscultation without wheezes, rales, or rhonchi. Breathing is unlabored. Heart: RRR with S1 S2. No murmurs, rubs, or gallops. Musculoskeletal:  Strength and tone normal for age. Neck:  Palpated the left side of the neck--reports that there is no tenderness or pain with palpation there. Had him tilt each ear to shoulder, had him turn the head to the right and turn to the left--- he states that he has no neck pain with any of this movement and no reproduction/increase of symptoms with this movement. LEFT ELBOW:  There is an area just above the olecranon process at the posterior aspect of the arm which he states is the area of pain.  There is no tenderness with palpation of the lateral epicondyles or the medial epicondyle. No tenderness with palpation of the olecranon process itself. 5/5 strength of the left arm and left grip strength.  LEFT SHOULDER: He can fully abduct at the shoulder with no increase in symptoms. Neuro: Alert and oriented X 3. Moves all extremities spontaneously. Gait is normal. CNII-XII grossly in tact. Psych:  Responds to questions appropriately with a normal affect.     ASSESSMENT AND PLAN:  75 y.o. year old male with  1. Paresthesias in left hand Reviewed MRI cervical spine performed 06/01/2008.   This showed: 1--severe left facet joint arthritis at C2-3, C3-4, and to a slightly lesser degree at C4-5. 2---mild degenerative disc disease at C4-5, C5-6, and C6-7 without discrete disc herniation or focal nerve root impingement. There is mild right foraminal stenosis at C5-6 due to intimate spurs.  Reviewed with patient that this is what the MRI showed back in 2010 which was 6-1/2 years ago. Could be even worse now.  Reviewed with him that I am concerned that the symptoms he is having are secondary to disease in the cervical spine.  However he states that he is quite certain that the problem is originating in the left elbow.  Will obtain nerve conduction studies to determine whether this is coming from the neck versus further down at the elbow region.  Will treat with prednisone to decrease inflammation around the nerve and hopefully give him some symptom relief.  Will have him schedule follow-up visit here in 2 weeks to follow-up nerve conduction test and his symptoms after prednisone.  He is to avoid use of his left arm as much as possible.  - Motor nerve conduction test w/ f-wave study; Future - predniSONE (DELTASONE) 20 MG tablet; Take 3 daily for 2 days, then 2 daily for  2 days, then 1 daily for 2 days.  Dispense: 12 tablet; Refill: 0  2. Left elbow pain  - Motor nerve conduction test w/ f-wave study; Future - predniSONE (DELTASONE) 20 MG tablet; Take 3 daily for 2 days, then 2 daily for 2 days, then 1 daily for 2 days.  Dispense: 12 tablet; Refill: 0  3. Cervical disc disorder with radiculopathy of cervical region  - Motor nerve conduction test w/ f-wave study; Future - predniSONE (DELTASONE) 20 MG tablet; Take 3 daily for 2 days, then 2 daily for  2 days, then 1 daily for 2 days.  Dispense: 12 tablet; Refill: 0   Signed, [address] [place], [place], [facility] 02/16/2015 12:57 PM

## 2015-03-02 ENCOUNTER — Ambulatory Visit: Payer: Medicare Other | Admitting: Family Medicine

## 2015-03-10 ENCOUNTER — Ambulatory Visit (INDEPENDENT_AMBULATORY_CARE_PROVIDER_SITE_OTHER): Payer: Medicare Other | Admitting: Family Medicine

## 2015-03-10 ENCOUNTER — Encounter: Payer: Self-pay | Admitting: Family Medicine

## 2015-03-10 VITALS — BP 140/88 | HR 80 | Temp 98.0°F | Resp 16 | Ht 67.0 in | Wt 173.0 lb

## 2015-03-10 DIAGNOSIS — I10 Essential (primary) hypertension: Secondary | ICD-10-CM

## 2015-03-10 DIAGNOSIS — Z125 Encounter for screening for malignant neoplasm of prostate: Secondary | ICD-10-CM

## 2015-03-10 DIAGNOSIS — R233 Spontaneous ecchymoses: Secondary | ICD-10-CM

## 2015-03-10 DIAGNOSIS — R202 Paresthesia of skin: Secondary | ICD-10-CM | POA: Diagnosis not present

## 2015-03-10 DIAGNOSIS — R238 Other skin changes: Secondary | ICD-10-CM | POA: Diagnosis not present

## 2015-03-10 NOTE — Progress Notes (Signed)
Subjective:    Patient ID: Alex Martin, male    DOB: 08/28/1940, 75 y.o.   MRN: [id number]  HPI  please see the office visit the patient have with my physician assistant last month. 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 He had paresthesias in his fourth and fifth digit and weakness in his left hand. However with the passage of time this has improved dramatically and is now essentially 100% resolved. He does report easy bruising on the dorsums of both forearms. He has numerous senile purpura is. He denies any blood in his stool , black tarry stool, or hematemesis. He denies any epistaxis. His blood pressure today is well controlled at 140/88 he is long overdue for fasting lab work. He is also overdue for PSA Past Medical History  Diagnosis Date  . Abdominal distension     UMBILICAL HERNIA-NO PAIN  . Blood in urine     MICROSCOPIC BLOODEVALUATED BY DR. NESI FRIDAY 02/11/11--URINE SAMPLE AND EXAM-- AND BLOOD SAMPLE AND TO BE SEEN AGAIN IN 6 MONTHS AND GIVEN VESICARE  FOR FREQUENCY AND URGENCY  . Hypertension   . Bleeding from the nose     SEVERAL EPISODES THAT REQUIRED TX IN ER--LAST EPISODE WAS APPROX 1 YR AGO  . GERD (gastroesophageal reflux disease)    Past Surgical History  Procedure Laterality Date  . Vasectomy  1976  . Hernia repair  1959    [facility]  . Umbilical hernia repair  02/15/2011    Procedure: HERNIA REPAIR UMBILICAL ADULT;  Surgeon: Odis Hollingshead, MD;  Location: [facility];  Service: General;  Laterality: N/A;  umbilical hernia repair with mesh   Current Outpatient Prescriptions on File Prior to Visit  Medication Sig Dispense Refill  . aspirin 81 MG chewable tablet Chew 81 mg by mouth daily.    Marland Kitchen latanoprost (XALATAN) 0.005 % ophthalmic solution 1 DROP IN BOTH EYES AT BEDTIME  0  . losartan (COZAAR) 50 MG tablet TAKE 1 TABLET EVERY DAY 30 tablet 3  . Multiple Vitamin (MULTIVITAMIN) tablet Take 1 tablet by mouth daily.    Marland Kitchen  NEXIUM 40 MG capsule Take 1 capsule (40 mg total) by mouth daily. 30 capsule 6  . valACYclovir (VALTREX) 1000 MG tablet Take 2 tablets (2,000 mg total) by mouth 2 (two) times daily. X 1 day 8 tablet 3  . VESICARE 5 MG tablet TAKE 1 TABLET BY MOUTH DAILY 30 tablet 11   No current facility-administered medications on file prior to visit.   Allergies  Allergen Reactions  . Shellfish Allergy     N&V AFTER EATING MUSSELS   Social History   Social History  . Marital Status: Single    Spouse Name: N/A  . Number of Children: N/A  . Years of Education: N/A   Occupational History  . Not on file.   Social History Main Topics  . Smoking status: Former Smoker -- 10 years    Quit date: 02/22/1992  . Smokeless tobacco: Never Used     Comment: QUIT SMOKING ABOUT 1994 - 1 PP WEEK  . Alcohol Use: 0.0 oz/week     Comment: 2 GLASSES WINE DAILY  . Drug Use: No  . Sexual Activity: Not on file   Other Topics Concern  . Not on file   Social History Narrative      Review of Systems  All other systems reviewed and are negative.      Objective:  Physical Exam  Constitutional: He is oriented to person, place, and time. He appears well-developed and well-nourished.  Neck: No JVD present.  Cardiovascular: Normal rate, regular rhythm and normal heart sounds.   Pulmonary/Chest: Effort normal and breath sounds normal. No respiratory distress. He has no wheezes. He has no rales.  Abdominal: Soft. Bowel sounds are normal.  Lymphadenopathy:    He has no cervical adenopathy.  Neurological: He is alert and oriented to person, place, and time. He has normal reflexes. He displays normal reflexes. No cranial nerve deficit. He exhibits normal muscle tone. Coordination normal.  Vitals reviewed.         Assessment & Plan:  Easy bruising - Plan: COMPLETE METABOLIC PANEL WITH GFR, CBC with Differential/Platelet  Screening for prostate cancer - Plan: PSA  Paresthesias in left hand  Benign  essential HTN   The ulnar neuropathy the patient was experiencing has almost completely resolved. No further workup unless symptoms return. I believe the senile purpura are benign but I will check a CBC as well as a CMP to rule out causes of improper coagulation. I will check a PSA while drawing blood work to screen for prostate cancer. His blood pressure today is adequately controlled.  I did ask the patient to return fasting for fasting lipid panel at his convenience

## 2015-03-11 LAB — CBC WITH DIFFERENTIAL/PLATELET
Basophils Absolute: 0.1 10*3/uL (ref 0.0–0.1)
Basophils Relative: 1 % (ref 0–1)
Eosinophils Absolute: 0.4 10*3/uL (ref 0.0–0.7)
Eosinophils Relative: 5 % (ref 0–5)
HEMATOCRIT: 44.8 % (ref 39.0–52.0)
Hemoglobin: 15.2 g/dL (ref 13.0–17.0)
LYMPHS ABS: 1.6 10*3/uL (ref 0.7–4.0)
Lymphocytes Relative: 21 % (ref 12–46)
MCH: 32.9 pg (ref 26.0–34.0)
MCHC: 33.9 g/dL (ref 30.0–36.0)
MCV: 97 fL (ref 78.0–100.0)
MONOS PCT: 11 % (ref 3–12)
MPV: 10 fL (ref 8.6–12.4)
Monocytes Absolute: 0.8 10*3/uL (ref 0.1–1.0)
NEUTROS ABS: 4.7 10*3/uL (ref 1.7–7.7)
Neutrophils Relative %: 62 % (ref 43–77)
PLATELETS: 256 10*3/uL (ref 150–400)
RBC: 4.62 MIL/uL (ref 4.22–5.81)
RDW: 13.7 % (ref 11.5–15.5)
WBC: 7.5 10*3/uL (ref 4.0–10.5)

## 2015-03-11 LAB — COMPLETE METABOLIC PANEL WITH GFR
ALT: 17 U/L (ref 9–46)
AST: 18 U/L (ref 10–35)
Albumin: 4.1 g/dL (ref 3.6–5.1)
Alkaline Phosphatase: 68 U/L (ref 40–115)
BUN: 22 mg/dL (ref 7–25)
CO2: 28 mmol/L (ref 20–31)
Calcium: 9.2 mg/dL (ref 8.6–10.3)
Chloride: 102 mmol/L (ref 98–110)
Creat: 1.08 mg/dL (ref 0.70–1.18)
GFR, Est African American: 78 mL/min (ref >=60)
GFR, Est Non African American: 67 mL/min (ref >=60)
GLUCOSE: 97 mg/dL (ref 70–99)
Potassium: 4.5 mmol/L (ref 3.5–5.3)
SODIUM: 138 mmol/L (ref 135–146)
TOTAL PROTEIN: 6.8 g/dL (ref 6.1–8.1)
Total Bilirubin: 0.9 mg/dL (ref 0.2–1.2)

## 2015-03-11 LAB — PSA: PSA: 0.44 ng/mL (ref <=4.00)

## 2015-03-12 ENCOUNTER — Other Ambulatory Visit: Payer: Medicare Other

## 2015-03-12 DIAGNOSIS — Z13220 Encounter for screening for lipoid disorders: Secondary | ICD-10-CM

## 2015-03-12 LAB — LIPID PANEL
Cholesterol: 243 mg/dL — ABNORMAL HIGH (ref 125–200)
HDL: 52 mg/dL (ref >=40)
LDL CALC: 160 mg/dL — AB (ref <130)
Total CHOL/HDL Ratio: 4.7 Ratio (ref <=5.0)
Triglycerides: 156 mg/dL — ABNORMAL HIGH (ref <150)
VLDL: 31 mg/dL — ABNORMAL HIGH (ref <30)

## 2015-03-13 ENCOUNTER — Encounter: Payer: Self-pay | Admitting: Family Medicine

## 2015-03-17 ENCOUNTER — Ambulatory Visit: Payer: Medicare Other | Admitting: Family Medicine

## 2015-03-18 ENCOUNTER — Telehealth: Payer: Self-pay | Admitting: Family Medicine

## 2015-03-18 MED ORDER — ATORVASTATIN CALCIUM 20 MG PO TABS: 20.0000 mg | ORAL_TABLET | Freq: Every day | ORAL | Status: DC

## 2015-03-18 NOTE — Telephone Encounter (Signed)
Patient saw message on mychart and said could you please call in his cholesterol med to [facility] on [address]  If possible

## 2015-03-18 NOTE — Telephone Encounter (Signed)
Medication called/sent to requested pharmacy  

## 2015-04-03 ENCOUNTER — Other Ambulatory Visit: Payer: Self-pay | Admitting: Family Medicine

## 2015-04-03 NOTE — Telephone Encounter (Signed)
Medication refilled per protocol. 

## 2015-05-08 ENCOUNTER — Ambulatory Visit (INDEPENDENT_AMBULATORY_CARE_PROVIDER_SITE_OTHER): Payer: Medicare Other | Admitting: Family Medicine

## 2015-05-08 ENCOUNTER — Encounter: Payer: Self-pay | Admitting: Family Medicine

## 2015-05-08 VITALS — BP 124/82 | HR 78 | Temp 98.0°F | Resp 16 | Ht 67.0 in | Wt 169.0 lb

## 2015-05-08 DIAGNOSIS — L84 Corns and callosities: Secondary | ICD-10-CM | POA: Diagnosis not present

## 2015-05-08 NOTE — Progress Notes (Signed)
Subjective:    Patient ID: Alex Martin, male    DOB: 02-08-1940, 75 y.o.   MRN: [id number]  HPI The patient has a thick hyperkeratotic papule between his first and second toes on his right foot. It is approximately 1 cm in diameter. It is purplish brown in discoloration secondary to the subcutaneous bruise/capillary hemorrhage. It has lost the dermal ridges and appears to be a plantar's wart versus a thick callus. It is in direct opposition to the IP joint on the second toe which is arthritic and to the shape of the joint apply this pressure in this area constantly and is likely irritating the skin there. Past Medical History  Diagnosis Date  . Abdominal distension     UMBILICAL HERNIA-NO PAIN  . Blood in urine     MICROSCOPIC BLOODEVALUATED BY DR. NESI FRIDAY 02/11/11--URINE SAMPLE AND EXAM-- AND BLOOD SAMPLE AND TO BE SEEN AGAIN IN 6 MONTHS AND GIVEN VESICARE  FOR FREQUENCY AND URGENCY  . Hypertension   . Bleeding from the nose     SEVERAL EPISODES THAT REQUIRED TX IN ER--LAST EPISODE WAS APPROX 1 YR AGO  . GERD (gastroesophageal reflux disease)    Past Surgical History  Procedure Laterality Date  . Vasectomy  1976  . Hernia repair  1959    [facility]  . Umbilical hernia repair  02/15/2011    Procedure: HERNIA REPAIR UMBILICAL ADULT;  Surgeon: Odis Hollingshead, MD;  Location: [facility];  Service: General;  Laterality: N/A;  umbilical hernia repair with mesh   Current Outpatient Prescriptions on File Prior to Visit  Medication Sig Dispense Refill  . aspirin 81 MG chewable tablet Chew 81 mg by mouth daily.    Marland Kitchen atorvastatin (LIPITOR) 20 MG tablet Take 1 tablet (20 mg total) by mouth daily. 90 tablet 4  . latanoprost (XALATAN) 0.005 % ophthalmic solution 1 DROP IN BOTH EYES AT BEDTIME  0  . losartan (COZAAR) 50 MG tablet TAKE 1 TABLET BY MOUTH DAILY 90 tablet 1  . Multiple Vitamin (MULTIVITAMIN) tablet Take 1 tablet by mouth daily.    Marland Kitchen NEXIUM 40 MG capsule TAKE 1 CAPSULE (40 MG TOTAL) BY  MOUTH DAILY. 90 capsule 1  . valACYclovir (VALTREX) 1000 MG tablet Take 2 tablets (2,000 mg total) by mouth 2 (two) times daily. X 1 day 8 tablet 3  . VESICARE 5 MG tablet TAKE 1 TABLET BY MOUTH DAILY 30 tablet 11   No current facility-administered medications on file prior to visit.   Allergies  Allergen Reactions  . Shellfish Allergy     N&V AFTER EATING MUSSELS   Social History   Social History  . Marital Status: Single    Spouse Name: N/A  . Number of Children: N/A  . Years of Education: N/A   Occupational History  . Not on file.   Social History Main Topics  . Smoking status: Former Smoker -- 10 years    Quit date: 02/22/1992  . Smokeless tobacco: Never Used     Comment: QUIT SMOKING ABOUT 1994 - 1 PP WEEK  . Alcohol Use: 0.0 oz/week     Comment: 2 GLASSES WINE DAILY  . Drug Use: No  . Sexual Activity: Not on file   Other Topics Concern  . Not on file   Social History Narrative      Review of Systems  All other systems reviewed and are negative.      Objective:   Physical Exam  Cardiovascular: Normal rate  and regular rhythm.   Pulmonary/Chest: Effort normal and breath sounds normal.  Vitals reviewed.  please see the description in the history of present illness        Assessment & Plan:  Pre-ulcerative corn or callous  Given the location, it is difficult to determine whether this is a callus with a subcutaneous capillary hemorrhage causing the purplish discoloration or whether this is a plantar wart.  However I do believe that the protuberant IP joint on the adjacent toe is likely irritating the skin in this area making it worse. I have recommended a Dr. Felicie Morn wart and callus remover pad be applied to this area daily for the next 4-6 weeks to slowly degrade the hyperkeratotic tissue probably leading to healthy skin. I did offer the patient a referral to podiatry to discuss surgical options to help reduce the bone spur on the IP joint which is  irritating the adjacent skin but the patient declines that at the present time

## 2015-06-18 ENCOUNTER — Ambulatory Visit (INDEPENDENT_AMBULATORY_CARE_PROVIDER_SITE_OTHER): Payer: Medicare Other | Admitting: Family Medicine

## 2015-06-18 ENCOUNTER — Encounter: Payer: Self-pay | Admitting: Family Medicine

## 2015-06-18 VITALS — BP 142/80 | HR 64 | Temp 98.2°F | Resp 12 | Ht 67.0 in | Wt 172.0 lb

## 2015-06-18 DIAGNOSIS — G629 Polyneuropathy, unspecified: Secondary | ICD-10-CM

## 2015-06-18 LAB — VITAMIN B12: VITAMIN B 12: 272 pg/mL (ref 200–1100)

## 2015-06-18 LAB — HEMOGLOBIN A1C
Hgb A1c MFr Bld: 5.3 % (ref <5.7)
MEAN PLASMA GLUCOSE: 105 mg/dL

## 2015-06-18 LAB — TSH: TSH: 3.65 mIU/L (ref 0.40–4.50)

## 2015-06-18 LAB — URIC ACID: Uric Acid, Serum: 4.9 mg/dL (ref 4.0–7.8)

## 2015-06-18 NOTE — Progress Notes (Signed)
Subjective:    Patient ID: Alex Martin, male    DOB: Nov 02, 1940, 75 y.o.   MRN: [id number]  HPI  The patient Reports burning pain in the tips of his right great toe. It stings and burns. It is worse with prolonged walking. It is limited to the tip of the right toe. However the pain can be substantial at times. He is advised to back pain or weakness or numbness in his legs. He denies any neuropathic pain in his legs. There is no visible deformity in the toe. He does have onychomycosis in the right great toenail. There is no erythema or warmth in the right toe. There is no pain with range of motion. There is no palpable deformity Past Medical History  Diagnosis Date  . Abdominal distension     UMBILICAL HERNIA-NO PAIN  . Blood in urine     MICROSCOPIC BLOODEVALUATED BY DR. NESI FRIDAY 02/11/11--URINE SAMPLE AND EXAM-- AND BLOOD SAMPLE AND TO BE SEEN AGAIN IN 6 MONTHS AND GIVEN VESICARE  FOR FREQUENCY AND URGENCY  . Hypertension   . Bleeding from the nose     SEVERAL EPISODES THAT REQUIRED TX IN ER--LAST EPISODE WAS APPROX 1 YR AGO  . GERD (gastroesophageal reflux disease)    Past Surgical History  Procedure Laterality Date  . Vasectomy  1976  . Hernia repair  1959    [facility]  . Umbilical hernia repair  02/15/2011    Procedure: HERNIA REPAIR UMBILICAL ADULT;  Surgeon: Odis Hollingshead, MD;  Location: [facility];  Service: General;  Laterality: N/A;  umbilical hernia repair with mesh   Current Outpatient Prescriptions on File Prior to Visit  Medication Sig Dispense Refill  . atorvastatin (LIPITOR) 20 MG tablet Take 1 tablet (20 mg total) by mouth daily. 90 tablet 4  . latanoprost (XALATAN) 0.005 % ophthalmic solution 1 DROP IN BOTH EYES AT BEDTIME  0  . losartan (COZAAR) 50 MG tablet TAKE 1 TABLET BY MOUTH DAILY 90 tablet 1  . Multiple Vitamin (MULTIVITAMIN) tablet Take 1 tablet by mouth daily.    Marland Kitchen NEXIUM 40 MG capsule TAKE 1 CAPSULE (40 MG TOTAL) BY MOUTH DAILY. 90 capsule 1  .  valACYclovir (VALTREX) 1000 MG tablet Take 2 tablets (2,000 mg total) by mouth 2 (two) times daily. X 1 day 8 tablet 3  . VESICARE 5 MG tablet TAKE 1 TABLET BY MOUTH DAILY 30 tablet 11   No current facility-administered medications on file prior to visit.   Allergies  Allergen Reactions  . Shellfish Allergy     N&V AFTER EATING MUSSELS   Social History   Social History  . Marital Status: Single    Spouse Name: N/A  . Number of Children: N/A  . Years of Education: N/A   Occupational History  . Not on file.   Social History Main Topics  . Smoking status: Former Smoker -- 10 years    Quit date: 02/22/1992  . Smokeless tobacco: Never Used     Comment: QUIT SMOKING ABOUT 1994 - 1 PP WEEK  . Alcohol Use: 0.0 oz/week     Comment: 2 GLASSES WINE DAILY  . Drug Use: No  . Sexual Activity: Not on file   Other Topics Concern  . Not on file   Social History Narrative      Review of Systems  All other systems reviewed and are negative.      Objective:   Physical Exam  Cardiovascular: Normal rate and regular  rhythm.   Pulmonary/Chest: Effort normal and breath sounds normal.  Vitals reviewed.  please see the description in the history of present illness        Assessment & Plan:  Neuropathy ([place]) - Plan: TSH, Hemoglobin A1c, Vitamin B12, Uric acid  I suspect the patient has peripheral neuropathy. I will check laboratory studies to rule out potential causes of peripheral neuropathy. I expect that this is more of a compressive neuropathy due to chronic standing and walking and running. We could try gabapentin 300 mg every 8 hours as needed. However I will obtain lab work first and also check a uric acid to rule out gout. Consider an x-ray of pain worsens however this is not in the joint and therefore I do not feel his arthritic. I doubt a fracture. Bony malignancy would be unlikely in the right great toe

## 2015-06-23 ENCOUNTER — Telehealth: Payer: Self-pay

## 2015-06-23 MED ORDER — GABAPENTIN 100 MG PO CAPS: 100.0000 mg | ORAL_CAPSULE | Freq: Three times a day (TID) | ORAL | Status: DC

## 2015-06-23 NOTE — Telephone Encounter (Signed)
-----   Message from Susy Frizzle, MD sent at 06/19/2015  6:52 AM EDT ----- Labs nml.  B12 slightly low.  Should take vitamin with b12 but I do not feel this is the source of his neuropathy.  Would he like to try gabapentin 100 mg potid prn nerve pain?This is a low dose, we can increase if needed.

## 2015-06-23 NOTE — Telephone Encounter (Signed)
Patient informed to start b12 vitamin.  He says his pain is better after soaking in vinegar.  Gabapentin sent to pharmacy.

## 2015-08-11 ENCOUNTER — Telehealth: Payer: Self-pay | Admitting: Family Medicine

## 2015-08-11 DIAGNOSIS — L84 Corns and callosities: Secondary | ICD-10-CM

## 2015-08-11 NOTE — Telephone Encounter (Signed)
Referral placed to Podiatry per pt request for pre-ulcerative calluses on feet.

## 2015-08-11 NOTE — Telephone Encounter (Signed)
PATIENT IS CALLING TO GET REFERRAL TO PODIATRIST IF POSSIBLE, HE SAYS THAT ANYWHERE IN [place] IS FINE  [phone] HE HAS BEEN SEEN HERE FOR THE ISSUE

## 2015-08-11 NOTE — Telephone Encounter (Signed)
ok 

## 2015-08-27 ENCOUNTER — Ambulatory Visit (INDEPENDENT_AMBULATORY_CARE_PROVIDER_SITE_OTHER): Payer: Medicare Other | Admitting: Podiatry

## 2015-08-27 ENCOUNTER — Encounter: Payer: Self-pay | Admitting: Podiatry

## 2015-08-27 ENCOUNTER — Ambulatory Visit (INDEPENDENT_AMBULATORY_CARE_PROVIDER_SITE_OTHER): Payer: Medicare Other

## 2015-08-27 VITALS — BP 131/70 | HR 57 | Resp 16 | Ht 67.0 in | Wt 170.0 lb

## 2015-08-27 DIAGNOSIS — M79674 Pain in right toe(s): Secondary | ICD-10-CM

## 2015-08-27 DIAGNOSIS — M205X1 Other deformities of toe(s) (acquired), right foot: Secondary | ICD-10-CM | POA: Diagnosis not present

## 2015-08-27 DIAGNOSIS — L84 Corns and callosities: Secondary | ICD-10-CM | POA: Diagnosis not present

## 2015-08-27 DIAGNOSIS — M779 Enthesopathy, unspecified: Secondary | ICD-10-CM

## 2015-08-27 MED ORDER — TRIAMCINOLONE ACETONIDE 10 MG/ML IJ SUSP
10.0000 mg | Freq: Once | INTRAMUSCULAR | Status: AC
  Administered 2015-08-27: 10 mg

## 2015-08-27 NOTE — Progress Notes (Signed)
   Subjective:    Patient ID: Alex Martin, male    DOB: [birth year], 75 y.o.   MRN: [id number]  HPI Chief Complaint  Patient presents with  . Callouses    Right foot; great toe-lateral side; pt stated, "Has used OTC corn remover with some relief"  . Toe Pain    Right foot; great toe; pt stated, "Has pain underneath toe when walking"      Review of Systems  Genitourinary: Positive for frequency.  Hematological: Bruises/bleeds easily.  All other systems reviewed and are negative.      Objective:   Physical Exam        Assessment & Plan:

## 2015-08-28 NOTE — Progress Notes (Signed)
Subjective:     Patient ID: Alex Martin, male   DOB: December 20, 1940, 75 y.o.   MRN: [id number]  HPI patient presents stating he has a lot of pain between the big toe in his right toe of his right foot with patient also noted to have significant range of motion loss of the first and second metatarsophalangeal joint right over left   Review of Systems  All other systems reviewed and are negative.      Objective:   Physical Exam  Constitutional: He is oriented to person, place, and time.  Cardiovascular: Intact distal pulses.   Musculoskeletal: Normal range of motion.  Neurological: He is oriented to person, place, and time.  Skin: Skin is warm and dry.  Nursing note and vitals reviewed.  neurovascular status found to be intact muscle strength is adequate with significant reduction of motion of the first MPJ right foot with second MPJ also having inflammation. He is found to have a inflamed keratotic lesion interphalangeal joint lateral side right big toe that's very tender when pressed with keratotic tissue formation and fluid buildup. The second toe has mild lesion but nowhere near as significant as the big and it's very tender when pressed     Assessment:     Inflammatory capsulitis interphalangeal joint right hallux with abnormal position of the hallux pressing against the second toe and significant range of motion loss with keratotic tissue formation    Plan:     H&P x-rays reviewed and condition discussed with patient. At this point I did a proximal nerve block and under sterile conditions didn't interphalangeal injection of 2 mg dexamethasone Kenalog 5 mg Xylocaine and then did deep debridement of lesion and padded. I want to see him back again when symptomatic and may require eventual implant arthroplasty or other procedure that could salt this problem. He will reappoint to recheck  X-ray report indicates that there is severe arthritis of the first and second MPJ right with  deviation of the hallux against the second toe right foot

## 2015-09-24 DIAGNOSIS — L84 Corns and callosities: Secondary | ICD-10-CM

## 2015-10-08 ENCOUNTER — Other Ambulatory Visit: Payer: Self-pay | Admitting: Family Medicine

## 2015-10-09 NOTE — Telephone Encounter (Signed)
RX filled per protocol 

## 2015-10-10 ENCOUNTER — Other Ambulatory Visit: Payer: Self-pay | Admitting: Family Medicine

## 2015-11-10 DIAGNOSIS — H903 Sensorineural hearing loss, bilateral: Secondary | ICD-10-CM | POA: Insufficient documentation

## 2015-11-24 ENCOUNTER — Ambulatory Visit (INDEPENDENT_AMBULATORY_CARE_PROVIDER_SITE_OTHER): Payer: Medicare Other | Admitting: Family Medicine

## 2015-11-24 ENCOUNTER — Encounter: Payer: Self-pay | Admitting: Family Medicine

## 2015-11-24 VITALS — BP 122/74 | HR 80 | Temp 98.3°F | Resp 14 | Ht 67.0 in | Wt 177.0 lb

## 2015-11-24 DIAGNOSIS — I10 Essential (primary) hypertension: Secondary | ICD-10-CM

## 2015-11-24 LAB — LIPID PANEL
CHOL/HDL RATIO: 2.8 ratio (ref <5.0)
Cholesterol: 164 mg/dL (ref <200)
HDL: 59 mg/dL (ref >40)
LDL Cholesterol: 78 mg/dL
Triglycerides: 133 mg/dL (ref <150)
VLDL: 27 mg/dL (ref <30)

## 2015-11-24 LAB — COMPLETE METABOLIC PANEL WITH GFR
ALT: 22 U/L (ref 9–46)
AST: 24 U/L (ref 10–35)
Albumin: 4 g/dL (ref 3.6–5.1)
Alkaline Phosphatase: 59 U/L (ref 40–115)
BUN: 17 mg/dL (ref 7–25)
CHLORIDE: 106 mmol/L (ref 98–110)
CO2: 27 mmol/L (ref 20–31)
CREATININE: 0.87 mg/dL (ref 0.70–1.18)
Calcium: 8.8 mg/dL (ref 8.6–10.3)
GFR, Est African American: >89 mL/min (ref >=60)
GFR, Est Non African American: 85 mL/min (ref >=60)
GLUCOSE: 91 mg/dL (ref 70–99)
POTASSIUM: 4.8 mmol/L (ref 3.5–5.3)
SODIUM: 141 mmol/L (ref 135–146)
Total Bilirubin: 0.6 mg/dL (ref 0.2–1.2)
Total Protein: 6.5 g/dL (ref 6.1–8.1)

## 2015-11-24 NOTE — Progress Notes (Signed)
Subjective:    Patient ID: Alex Martin, male    DOB: Jul 17, 1940, 75 y.o.   MRN: [id number]  HPI   Patient has a history of hypertension and hyperlipidemia. He denies any chest pain shortness of breath or dyspnea on exertion. His blood pressure today is well controlled. He is due to check a fasting lipid panel. Otherwise he is doing well. He does have several small actinic keratoses on his face and on the crown of his head. He has a dermatologist and I recommended he follow-up with his dermatologist for treatment of these areas.  Past Medical History:  Diagnosis Date  . Abdominal distension    UMBILICAL HERNIA-NO PAIN  . Bleeding from the nose    SEVERAL EPISODES THAT REQUIRED TX IN ER--LAST EPISODE WAS APPROX 1 YR AGO  . Blood in urine    MICROSCOPIC BLOODEVALUATED BY DR. NESI FRIDAY 02/11/11--URINE SAMPLE AND EXAM-- AND BLOOD SAMPLE AND TO BE SEEN AGAIN IN 6 MONTHS AND GIVEN VESICARE  FOR FREQUENCY AND URGENCY  . GERD (gastroesophageal reflux disease)   . Hypertension    Past Surgical History:  Procedure Laterality Date  . HERNIA REPAIR  1959   [facility]  . UMBILICAL HERNIA REPAIR  02/15/2011   Procedure: HERNIA REPAIR UMBILICAL ADULT;  Surgeon: Odis Hollingshead, MD;  Location: [facility];  Service: General;  Laterality: N/A;  umbilical hernia repair with mesh  . VASECTOMY  1976   Current Outpatient Prescriptions on File Prior to Visit  Medication Sig Dispense Refill  . aspirin EC 81 MG tablet Take 81 mg by mouth daily.    Marland Kitchen atorvastatin (LIPITOR) 20 MG tablet Take 1 tablet (20 mg total) by mouth daily. 90 tablet 4  . Cyanocobalamin (VITAMIN B-12 PO) Take 1 tablet by mouth daily.    Marland Kitchen gabapentin (NEURONTIN) 100 MG capsule Take 1 capsule (100 mg total) by mouth 3 (three) times daily. 90 capsule 0  . latanoprost (XALATAN) 0.005 % ophthalmic solution 1 DROP IN BOTH EYES AT BEDTIME  0  . losartan (COZAAR) 50 MG tablet TAKE 1 TABLET BY MOUTH DAILY 90 tablet 2  . Multiple Vitamin  (MULTIVITAMIN) tablet Take 1 tablet by mouth daily.    Marland Kitchen NEXIUM 40 MG capsule TAKE 1 CAPSULE (40 MG TOTAL) BY MOUTH DAILY. 90 capsule 1  . valACYclovir (VALTREX) 1000 MG tablet Take 2 tablets (2,000 mg total) by mouth 2 (two) times daily. X 1 day 8 tablet 3  . VESICARE 5 MG tablet TAKE 1 TABLET BY MOUTH DAILY 30 tablet 10   No current facility-administered medications on file prior to visit.    Allergies  Allergen Reactions  . Shellfish Allergy     N&V AFTER EATING MUSSELS   Social History   Social History  . Marital status: Single    Spouse name: N/A  . Number of children: N/A  . Years of education: N/A   Occupational History  . Not on file.   Social History Main Topics  . Smoking status: Former Smoker    Years: 10.00    Quit date: 02/22/1992  . Smokeless tobacco: Never Used     Comment: QUIT SMOKING ABOUT 1994 - 1 PP WEEK  . Alcohol use 0.0 oz/week     Comment: 2 GLASSES WINE DAILY  . Drug use: No  . Sexual activity: Not on file   Other Topics Concern  . Not on file   Social History Narrative  . No narrative on file  Review of Systems  All other systems reviewed and are negative.      Objective:   Physical Exam  Constitutional: He appears well-developed and well-nourished.  Cardiovascular: Normal rate, regular rhythm and normal heart sounds.   No murmur heard. Pulmonary/Chest: Effort normal and breath sounds normal. No respiratory distress. He has no wheezes. He has no rales.  Abdominal: Soft. Bowel sounds are normal. He exhibits no distension. There is no tenderness. There is no rebound.  Vitals reviewed.         Assessment & Plan:  Benign essential HTN - Plan: COMPLETE METABOLIC PANEL WITH GFR, Lipid panel  Blood pressures well controlled. I will make no changes in his blood pressure medication at the present time. I will check a fasting lipid panel. Goal LDL cholesterol is less than 130 PSA is up-to-date and injected a urologist office.  Colonoscopy is up-to-date. Immunizations are up-to-date.

## 2015-12-25 ENCOUNTER — Ambulatory Visit (INDEPENDENT_AMBULATORY_CARE_PROVIDER_SITE_OTHER): Payer: Medicare Other | Admitting: Podiatry

## 2015-12-25 DIAGNOSIS — L84 Corns and callosities: Secondary | ICD-10-CM

## 2015-12-25 DIAGNOSIS — M79674 Pain in right toe(s): Secondary | ICD-10-CM

## 2015-12-25 DIAGNOSIS — M216X9 Other acquired deformities of unspecified foot: Secondary | ICD-10-CM

## 2015-12-25 DIAGNOSIS — M779 Enthesopathy, unspecified: Secondary | ICD-10-CM | POA: Diagnosis not present

## 2015-12-28 NOTE — Progress Notes (Signed)
Subjective:     Patient ID: Alex Martin, male   DOB: Oct 18, 1940, 75 y.o.   MRN: [id number]  HPI patient presents stating he's getting a lot of pain in his right foot and he's not sure if he may have damaged something   Review of Systems     Objective:   Physical Exam Neurovascular status intact muscle strength was adequate range of motion within normal limits with patient found to have inflammatory changes dorsum right foot with fluid buildup and inability to walk comfortably. Patient also has lesion formation that's become very painful    Assessment:     Chronic keratotic lesion with inflammation fluid around the right forefoot    Plan:     H&P conditions reviewed debridement accomplished and at this time applied air fracture walker in order to immobilize the foot and lower leg and take stress off of it. Patient will be seen back for [place] to recheck again in the next 6 weeks or earlier if needed

## 2016-01-16 ENCOUNTER — Other Ambulatory Visit: Payer: Self-pay | Admitting: Family Medicine

## 2016-04-15 ENCOUNTER — Other Ambulatory Visit: Payer: Self-pay | Admitting: Family Medicine

## 2016-05-04 ENCOUNTER — Other Ambulatory Visit: Payer: Self-pay | Admitting: Family Medicine

## 2016-06-30 ENCOUNTER — Ambulatory Visit (INDEPENDENT_AMBULATORY_CARE_PROVIDER_SITE_OTHER): Payer: Medicare Other | Admitting: Family Medicine

## 2016-06-30 ENCOUNTER — Ambulatory Visit: Payer: Medicare Other | Admitting: Family Medicine

## 2016-06-30 ENCOUNTER — Encounter: Payer: Self-pay | Admitting: Family Medicine

## 2016-06-30 VITALS — BP 158/82

## 2016-06-30 VITALS — BP 156/80 | HR 80 | Temp 97.8°F | Resp 14 | Ht 67.0 in | Wt 174.0 lb

## 2016-06-30 DIAGNOSIS — I10 Essential (primary) hypertension: Secondary | ICD-10-CM | POA: Diagnosis not present

## 2016-06-30 DIAGNOSIS — Z0000 Encounter for general adult medical examination without abnormal findings: Secondary | ICD-10-CM | POA: Diagnosis not present

## 2016-06-30 DIAGNOSIS — Z23 Encounter for immunization: Secondary | ICD-10-CM

## 2016-06-30 DIAGNOSIS — E7800 Pure hypercholesterolemia, unspecified: Secondary | ICD-10-CM

## 2016-06-30 LAB — CBC WITH DIFFERENTIAL/PLATELET
Basophils Absolute: 61 cells/uL (ref 0–200)
Basophils Relative: 1 %
EOS ABS: 244 {cells}/uL (ref 15–500)
Eosinophils Relative: 4 %
HEMATOCRIT: 45.7 % (ref 38.5–50.0)
Hemoglobin: 15.4 g/dL (ref 13.0–17.0)
LYMPHS ABS: 1464 {cells}/uL (ref 850–3900)
Lymphocytes Relative: 24 %
MCH: 32.7 pg (ref 27.0–33.0)
MCHC: 33.7 g/dL (ref 32.0–36.0)
MCV: 97 fL (ref 80.0–100.0)
MPV: 9.9 fL (ref 7.5–12.5)
Monocytes Absolute: 488 cells/uL (ref 200–950)
Monocytes Relative: 8 %
NEUTROS ABS: 3843 {cells}/uL (ref 1500–7800)
Neutrophils Relative %: 63 %
Platelets: 202 10*3/uL (ref 140–400)
RBC: 4.71 MIL/uL (ref 4.20–5.80)
RDW: 14.7 % (ref 11.0–15.0)
WBC: 6.1 10*3/uL (ref 3.8–10.8)

## 2016-06-30 LAB — LIPID PANEL
Cholesterol: 167 mg/dL (ref <200)
HDL: 61 mg/dL (ref >40)
LDL CALC: 80 mg/dL (ref <100)
TRIGLYCERIDES: 130 mg/dL (ref <150)
Total CHOL/HDL Ratio: 2.7 Ratio (ref <5.0)
VLDL: 26 mg/dL (ref <30)

## 2016-06-30 LAB — COMPLETE METABOLIC PANEL WITH GFR
ALT: 20 U/L (ref 9–46)
AST: 19 U/L (ref 10–35)
Albumin: 4 g/dL (ref 3.6–5.1)
Alkaline Phosphatase: 82 U/L (ref 40–115)
BILIRUBIN TOTAL: 0.8 mg/dL (ref 0.2–1.2)
BUN: 21 mg/dL (ref 7–25)
CALCIUM: 8.9 mg/dL (ref 8.6–10.3)
CHLORIDE: 104 mmol/L (ref 98–110)
CO2: 24 mmol/L (ref 20–31)
CREATININE: 0.92 mg/dL (ref 0.70–1.18)
GFR, Est Non African American: 81 mL/min (ref >=60)
Glucose, Bld: 81 mg/dL (ref 70–99)
Potassium: 4.4 mmol/L (ref 3.5–5.3)
Sodium: 141 mmol/L (ref 135–146)
TOTAL PROTEIN: 6.8 g/dL (ref 6.1–8.1)

## 2016-06-30 NOTE — Progress Notes (Signed)
Subjective:    Patient ID: Alex Martin, male    DOB: [birth year], 76 y.o.   MRN: [id number]  HPI Patient is here today for complete physical exam. His only concern are senile purpura on the dorsums of both forearms.  Regarding his immunizations. He is due for Pneumovax 23. He has had Prevnar 13 Zostavax as well as his tetanus shot. He does not require hepatitis C screening as he falls outside the range. We also discussed the risk factors for this and he declines any possible exposure. His last colonoscopy was in 2011 and was normal. He is due again in 2021. He sees urologist who is following his PSAs and doing digital rectal exams. His last PSA was 0.31 recently with his urologist. His blood pressure today is elevated significantly. Past Medical History:  Diagnosis Date  . Abdominal distension    UMBILICAL HERNIA-NO PAIN  . Bleeding from the nose    SEVERAL EPISODES THAT REQUIRED TX IN ER--LAST EPISODE WAS APPROX 1 YR AGO  . Blood in urine    MICROSCOPIC BLOODEVALUATED BY DR. NESI FRIDAY 02/11/11--URINE SAMPLE AND EXAM-- AND BLOOD SAMPLE AND TO BE SEEN AGAIN IN 6 MONTHS AND GIVEN VESICARE  FOR FREQUENCY AND URGENCY  . GERD (gastroesophageal reflux disease)   . Hypertension    Past Surgical History:  Procedure Laterality Date  . HERNIA REPAIR  1959   [facility]  . UMBILICAL HERNIA REPAIR  02/15/2011   Procedure: HERNIA REPAIR UMBILICAL ADULT;  Surgeon: Odis Hollingshead, MD;  Location: [facility];  Service: General;  Laterality: N/A;  umbilical hernia repair with mesh  . VASECTOMY  1976   Current Outpatient Prescriptions on File Prior to Visit  Medication Sig Dispense Refill  . aspirin EC 81 MG tablet Take 81 mg by mouth daily.    Marland Kitchen atorvastatin (LIPITOR) 20 MG tablet TAKE 1 TABLET (20 MG TOTAL) BY MOUTH DAILY. 90 tablet 3  . Cyanocobalamin (VITAMIN B-12 PO) Take 1 tablet by mouth daily.    Marland Kitchen latanoprost (XALATAN) 0.005 % ophthalmic solution 1 DROP IN BOTH EYES AT BEDTIME  0  . losartan  (COZAAR) 50 MG tablet TAKE 1 TABLET BY MOUTH DAILY 90 tablet 2  . Multiple Vitamin (MULTIVITAMIN) tablet Take 1 tablet by mouth daily.    Marland Kitchen NEXIUM 40 MG capsule TAKE 1 CAPSULE (40 MG TOTAL) BY MOUTH DAILY. 90 capsule 3  . valACYclovir (VALTREX) 1000 MG tablet Take 2 tablets (2,000 mg total) by mouth 2 (two) times daily. X 1 day 8 tablet 3  . VESICARE 5 MG tablet TAKE 1 TABLET BY MOUTH DAILY 30 tablet 10   No current facility-administered medications on file prior to visit.    Allergies  Allergen Reactions  . Shellfish Allergy     N&V AFTER EATING MUSSELS   Social History   Social History  . Marital status: Single    Spouse name: N/A  . Number of children: N/A  . Years of education: N/A   Occupational History  . Not on file.   Social History Main Topics  . Smoking status: Former Smoker    Years: 10.00    Quit date: 02/22/1992  . Smokeless tobacco: Never Used     Comment: QUIT SMOKING ABOUT 1994 - 1 PP WEEK  . Alcohol use 0.0 oz/week     Comment: 2 GLASSES WINE DAILY  . Drug use: No  . Sexual activity: Not on file   Other Topics Concern  . Not on file  Social History Narrative  . No narrative on file      Review of Systems  All other systems reviewed and are negative.      Objective:   Physical Exam  Constitutional: He is oriented to person, place, and time. He appears well-developed and well-nourished. No distress.  HENT:  Head: Normocephalic and atraumatic.  Right Ear: External ear normal.  Left Ear: External ear normal.  Nose: Nose normal.  Mouth/Throat: Oropharynx is clear and moist. No oropharyngeal exudate.  Eyes: Conjunctivae and EOM are normal. Pupils are equal, round, and reactive to light. Right eye exhibits no discharge. Left eye exhibits no discharge. No scleral icterus.  Neck: Normal range of motion. Neck supple. No JVD present. No tracheal deviation present. No thyromegaly present.  Cardiovascular: Normal rate, regular rhythm, normal heart sounds  and intact distal pulses.  Exam reveals no gallop and no friction rub.   No murmur heard. Pulmonary/Chest: Effort normal and breath sounds normal. No stridor. No respiratory distress. He has no wheezes. He has no rales. He exhibits no tenderness.  Abdominal: Soft. Bowel sounds are normal. He exhibits no distension and no mass. There is no tenderness. There is no rebound and no guarding.  Musculoskeletal: Normal range of motion. He exhibits no edema, tenderness or deformity.  Lymphadenopathy:    He has no cervical adenopathy.  Neurological: He is alert and oriented to person, place, and time. He has normal reflexes. He displays normal reflexes. No cranial nerve deficit. He exhibits normal muscle tone. Coordination normal.  Skin: Skin is warm. No rash noted. He is not diaphoretic. No erythema. No pallor.  Psychiatric: He has a normal mood and affect. His behavior is normal. Judgment and thought content normal.  Vitals reviewed.         Assessment & Plan:  Benign essential HTN  Pure hypercholesterolemia  General medical exam  Physical exam today is completely normal except for his elevated blood pressure. Patient elects to buy a blood pressure cuff and check his blood pressure readings over the next 2 weeks and then call me and notify me of the values. If his blood pressures consistently greater than 140/90, we will need to adjust his losartan to achieve a goal blood pressure less than 140/90. Patient received Pneumovax 23 today. His immunizations are otherwise up-to-date. His cancer screening is up-to-date. I would like the patient to return fasting for CBC, CMP, fasting lipid panel at his convenience

## 2016-06-30 NOTE — Addendum Note (Signed)
Addended by: Shary Decamp B on: 06/30/2016 11:30 AM   Modules accepted: Orders

## 2016-07-01 ENCOUNTER — Encounter: Payer: Self-pay | Admitting: Family Medicine

## 2016-07-03 ENCOUNTER — Other Ambulatory Visit: Payer: Self-pay | Admitting: Family Medicine

## 2016-07-12 ENCOUNTER — Ambulatory Visit: Payer: Medicare Other

## 2016-07-12 VITALS — BP 158/78

## 2016-07-12 DIAGNOSIS — I10 Essential (primary) hypertension: Secondary | ICD-10-CM

## 2016-07-28 ENCOUNTER — Telehealth: Payer: Self-pay | Admitting: *Deleted

## 2016-07-28 ENCOUNTER — Ambulatory Visit: Payer: Medicare Other | Admitting: *Deleted

## 2016-07-28 VITALS — BP 158/79

## 2016-07-28 DIAGNOSIS — I10 Essential (primary) hypertension: Secondary | ICD-10-CM

## 2016-07-28 NOTE — Telephone Encounter (Signed)
Patient in office to have BP checked.  BP checked by writer noted at 178/82, left arm, sitting, adult cuff. Patient brought in BP monitor. Concurrent reading noted at 158/79, right wrist, sitting, wrist cuff. Average reading of 30 readings noted at 146/77.  MD please advise.

## 2016-07-28 NOTE — Progress Notes (Signed)
Patient in office to have BP checked.  BP checked by writer noted at 178/82, left arm, sitting, adult cuff. Patient brought in BP monitor. Concurrent reading noted at 158/79, right wrist, sitting, wrist cuff. Average reading of 30 readings noted at 146/77.  MD to be made aware in telephone note.

## 2016-07-29 MED ORDER — LOSARTAN POTASSIUM-HCTZ 100-12.5 MG PO TABS: 1.0000 | ORAL_TABLET | Freq: Every day | ORAL | 1 refills | Status: DC

## 2016-07-29 NOTE — Telephone Encounter (Signed)
Patient returned call and made aware.   Appointment scheduled.   

## 2016-07-29 NOTE — Telephone Encounter (Signed)
BP is too high.  DC losartan and replace with hyzaar 100/12.5 mg poqday and recheck in 1 month.

## 2016-07-29 NOTE — Telephone Encounter (Signed)
Call placed to patient. Vayas.   Prescription sent to pharmacy.

## 2016-08-22 ENCOUNTER — Ambulatory Visit: Payer: Medicare Other | Admitting: Family Medicine

## 2016-08-22 VITALS — BP 148/72

## 2016-08-22 DIAGNOSIS — I10 Essential (primary) hypertension: Secondary | ICD-10-CM

## 2016-08-24 ENCOUNTER — Encounter: Payer: Self-pay | Admitting: Podiatry

## 2016-08-24 ENCOUNTER — Ambulatory Visit (INDEPENDENT_AMBULATORY_CARE_PROVIDER_SITE_OTHER): Payer: Medicare Other | Admitting: Podiatry

## 2016-08-24 DIAGNOSIS — M7751 Other enthesopathy of right foot: Secondary | ICD-10-CM | POA: Diagnosis not present

## 2016-08-24 DIAGNOSIS — M2041 Other hammer toe(s) (acquired), right foot: Secondary | ICD-10-CM | POA: Diagnosis not present

## 2016-08-24 DIAGNOSIS — L84 Corns and callosities: Secondary | ICD-10-CM | POA: Diagnosis not present

## 2016-08-24 MED ORDER — TRIAMCINOLONE ACETONIDE 10 MG/ML IJ SUSP
10.0000 mg | Freq: Once | INTRAMUSCULAR | Status: AC
  Administered 2016-08-24: 10 mg

## 2016-08-24 NOTE — Progress Notes (Signed)
Subjective:    Patient ID: Alex Martin, male   DOB: 76 y.o.   MRN: [id number]   HPI patient is found to have a lot of pain second digit right with keratotic lesion formation fluid buildup and history of this problem and states it did very well for a extended period of time but is becoming discomforting over the last 2 months    ROS      Objective:  Physical Exam neurovascular status intact with inflammatory changes of the interphalangeal joint digit 2 right medial side with keratotic lesion and deviation the hallux against the second toe     Assessment:    Inflammatory capsulitis of the second interphalangeal joint digit 2 right with keratotic lesion and hammertoe deformity secondary to structural change     Plan:    H&P and x-ray concerning hammertoe was given to patient with consideration for digital procedure. At this point were to treat conservatively and proximal nerve block was administered and under sterile condition interphalangeal injection consisting of quarter cc of dextran some Kenalog administered. Deep debridement of lesion accomplished sterile dressing applied padding applied and patient be seen back when symptomatic

## 2016-08-25 ENCOUNTER — Ambulatory Visit (INDEPENDENT_AMBULATORY_CARE_PROVIDER_SITE_OTHER): Payer: Medicare Other | Admitting: Family Medicine

## 2016-08-25 ENCOUNTER — Encounter: Payer: Self-pay | Admitting: Family Medicine

## 2016-08-25 VITALS — HR 68 | Temp 98.0°F | Resp 14 | Ht 67.0 in | Wt 174.0 lb

## 2016-08-25 DIAGNOSIS — I10 Essential (primary) hypertension: Secondary | ICD-10-CM | POA: Diagnosis not present

## 2016-08-25 NOTE — Progress Notes (Signed)
Subjective:    Patient ID: Alex Martin, male    DOB: 31-May-1940, 76 y.o.   MRN: [id number]  HPI  06/2016 Patient is here today for complete physical exam. His only concern are senile purpura on the dorsums of both forearms.  Regarding his immunizations. He is due for Pneumovax 23. He has had Prevnar 13 Zostavax as well as his tetanus shot. He does not require hepatitis C screening as he falls outside the range. We also discussed the risk factors for this and he declines any possible exposure. His last colonoscopy was in 2011 and was normal. He is due again in 2021. He sees urologist who is following his PSAs and doing digital rectal exams. His last PSA was 0.31 recently with his urologist. His blood pressure today is elevated significantly.  AT that time, my plan was: Physical exam today is completely normal except for his elevated blood pressure. Patient elects to buy a blood pressure cuff and check his blood pressure readings over the next 2 weeks and then call me and notify me of the values. If his blood pressures consistently greater than 140/90, we will need to adjust his losartan to achieve a goal blood pressure less than 140/90. Patient received Pneumovax 23 today. His immunizations are otherwise up-to-date. His cancer screening is up-to-date. I would like the patient to return fasting for CBC, CMP, fasting lipid panel at his convenience  08/25/16 Ultimately, the patient called back with elevated readings that supported the idea of increasing his medication. The patient was started on Hyzaar 100/12.5 one by mouth daily and his previous losartan was discontinued. He is here today for recheck. Since starting the medication, his average blood pressure has been 143/86 according to his meter. We have a similar reading here today. While improved, it still not at goal. Past Medical History:  Diagnosis Date  . Abdominal distension    UMBILICAL HERNIA-NO PAIN  . Bleeding from the nose    SEVERAL  EPISODES THAT REQUIRED TX IN ER--LAST EPISODE WAS APPROX 1 YR AGO  . Blood in urine    MICROSCOPIC BLOODEVALUATED BY DR. NESI FRIDAY 02/11/11--URINE SAMPLE AND EXAM-- AND BLOOD SAMPLE AND TO BE SEEN AGAIN IN 6 MONTHS AND GIVEN VESICARE  FOR FREQUENCY AND URGENCY  . GERD (gastroesophageal reflux disease)   . Hypertension    Past Surgical History:  Procedure Laterality Date  . HERNIA REPAIR  1959   [facility]  . UMBILICAL HERNIA REPAIR  02/15/2011   Procedure: HERNIA REPAIR UMBILICAL ADULT;  Surgeon: Odis Hollingshead, MD;  Location: [facility];  Service: General;  Laterality: N/A;  umbilical hernia repair with mesh  . VASECTOMY  1976   Current Outpatient Prescriptions on File Prior to Visit  Medication Sig Dispense Refill  . aspirin EC 81 MG tablet Take 81 mg by mouth daily.    Marland Kitchen atorvastatin (LIPITOR) 20 MG tablet TAKE 1 TABLET (20 MG TOTAL) BY MOUTH DAILY. 90 tablet 3  . Cyanocobalamin (VITAMIN B-12 PO) Take 1 tablet by mouth daily.    Marland Kitchen latanoprost (XALATAN) 0.005 % ophthalmic solution 1 DROP IN BOTH EYES AT BEDTIME  0  . losartan-hydrochlorothiazide (HYZAAR) 100-12.5 MG tablet Take 1 tablet by mouth daily. 90 tablet 1  . Multiple Vitamin (MULTIVITAMIN) tablet Take 1 tablet by mouth daily.    Marland Kitchen NEXIUM 40 MG capsule TAKE 1 CAPSULE (40 MG TOTAL) BY MOUTH DAILY. 90 capsule 3  . valACYclovir (VALTREX) 1000 MG tablet Take 2 tablets (2,000 mg total)  by mouth 2 (two) times daily. X 1 day 8 tablet 3  . VESICARE 5 MG tablet TAKE 1 TABLET BY MOUTH DAILY 30 tablet 10   No current facility-administered medications on file prior to visit.    Allergies  Allergen Reactions  . Shellfish Allergy     N&V AFTER EATING MUSSELS   Social History   Social History  . Marital status: Single    Spouse name: N/A  . Number of children: N/A  . Years of education: N/A   Occupational History  . Not on file.   Social History Main Topics  . Smoking status: Former Smoker    Years: 10.00    Quit date: 02/22/1992  .  Smokeless tobacco: Never Used     Comment: QUIT SMOKING ABOUT 1994 - 1 PP WEEK  . Alcohol use 0.0 oz/week     Comment: 2 GLASSES WINE DAILY  . Drug use: No  . Sexual activity: Not on file   Other Topics Concern  . Not on file   Social History Narrative  . No narrative on file      Review of Systems  All other systems reviewed and are negative.      Objective:   Physical Exam  Constitutional: He is oriented to person, place, and time. He appears well-developed and well-nourished. No distress.  HENT:  Head: Normocephalic and atraumatic.  Right Ear: External ear normal.  Left Ear: External ear normal.  Nose: Nose normal.  Mouth/Throat: Oropharynx is clear and moist. No oropharyngeal exudate.  Eyes: Pupils are equal, round, and reactive to light. Conjunctivae and EOM are normal. Right eye exhibits no discharge. Left eye exhibits no discharge. No scleral icterus.  Neck: Normal range of motion. Neck supple. No JVD present. No tracheal deviation present. No thyromegaly present.  Cardiovascular: Normal rate, regular rhythm, normal heart sounds and intact distal pulses.  Exam reveals no gallop and no friction rub.   No murmur heard. Pulmonary/Chest: Effort normal and breath sounds normal. No stridor. No respiratory distress. He has no wheezes. He has no rales. He exhibits no tenderness.  Abdominal: Soft. Bowel sounds are normal. He exhibits no distension and no mass. There is no tenderness. There is no rebound and no guarding.  Musculoskeletal: Normal range of motion. He exhibits no edema, tenderness or deformity.  Lymphadenopathy:    He has no cervical adenopathy.  Neurological: He is alert and oriented to person, place, and time. He has normal reflexes. No cranial nerve deficit. He exhibits normal muscle tone. Coordination normal.  Skin: Skin is warm. No rash noted. He is not diaphoretic. No erythema. No pallor.  Psychiatric: He has a normal mood and affect. His behavior is normal.  Judgment and thought content normal.  Vitals reviewed.         Assessment & Plan:  Benign essential HTN I outlined his options including discontinuing Hyzaar in switching to losartan 100 mg a day with amlodipine 10 mg a day. He ultimately decided to stick with Hyzaar and work on therapeutic lifestyle changes to address his blood pressure including 10 pounds weight loss, and a low-sodium diet, and increasing his aerobic exercise to 30 minutes a day 5 days a week. Recheck in 3 months

## 2016-08-26 ENCOUNTER — Telehealth: Payer: Self-pay | Admitting: *Deleted

## 2016-08-26 NOTE — Telephone Encounter (Signed)
"  Patient was scheduled for surgery and had to cancel.  He would like to reschedule his surgery.  Please call as soon possible." Bennetta Laos.  08/24/2016 I left patient a message to call and schedule an appointment with Dr. Paulla Dolly for a consultation.  His consent form has expired.

## 2016-08-29 ENCOUNTER — Ambulatory Visit: Payer: Self-pay | Admitting: Family Medicine

## 2016-09-07 ENCOUNTER — Ambulatory Visit (INDEPENDENT_AMBULATORY_CARE_PROVIDER_SITE_OTHER): Payer: Medicare Other | Admitting: Podiatry

## 2016-09-07 DIAGNOSIS — M2041 Other hammer toe(s) (acquired), right foot: Secondary | ICD-10-CM

## 2016-09-07 NOTE — Patient Instructions (Signed)
Pre-Operative Instructions  Congratulations, you have decided to take an important step towards improving your quality of life.  You can be assured that the doctors and staff at [facility] will be with you every step of the way.  Here are some important things you should know:  1. Plan to be at the surgery center/hospital at least 1 (one) hour prior to your scheduled time, unless otherwise directed by the surgical center/hospital staff.  You must have a responsible adult accompany you, remain during the surgery and drive you home.  Make sure you have directions to the surgical center/hospital to ensure you arrive on time. 2. If you are having surgery at [facility] or [facility] hospitals, you will need a copy of your medical history and physical form from your family physician within one month prior to the date of surgery. We will give you a form for your primary physician to complete.  3. We make every effort to accommodate the date you request for surgery.  However, there are times where surgery dates or times have to be moved.  We will contact you as soon as possible if a change in schedule is required.   4. No aspirin/ibuprofen for one week before surgery.  If you are on aspirin, any non-steroidal anti-inflammatory medications (Mobic, Aleve, Ibuprofen) should not be taken seven (7) days prior to your surgery.  You make take Tylenol for pain prior to surgery.  5. Medications - If you are taking daily heart and blood pressure medications, seizure, reflux, allergy, asthma, anxiety, pain or diabetes medications, make sure you notify the surgery center/hospital before the day of surgery so they can tell you which medications you should take or avoid the day of surgery. 6. No food or drink after midnight the night before surgery unless directed otherwise by surgical center/hospital staff. 7. No alcoholic beverages 24-hours prior to surgery.  No smoking 24-hours prior or 24-hours after  surgery. 8. Wear loose pants or shorts. They should be loose enough to fit over bandages, boots, and casts. 9. Don't wear slip-on shoes. Sneakers are preferred. 10. Bring your boot with you to the surgery center/hospital.  Also bring crutches or a walker if your physician has prescribed it for you.  If you do not have this equipment, it will be provided for you after surgery. 11. If you have not been contacted by the surgery center/hospital by the day before your surgery, call to confirm the date and time of your surgery. 12. Leave-time from work may vary depending on the type of surgery you have.  Appropriate arrangements should be made prior to surgery with your employer. 13. Prescriptions will be provided immediately following surgery by your doctor.  Fill these as soon as possible after surgery and take the medication as directed. Pain medications will not be refilled on weekends and must be approved by the doctor. 14. Remove nail polish on the operative foot and avoid getting pedicures prior to surgery. 15. Wash the night before surgery.  The night before surgery wash the foot and leg well with water and the antibacterial soap provided. Be sure to pay special attention to beneath the toenails and in between the toes.  Wash for at least three (3) minutes. Rinse thoroughly with water and dry well with a towel.  Perform this wash unless told not to do so by your physician.  Enclosed: 1 Ice pack (please put in freezer the night before surgery)   1 Hibiclens skin cleaner     Pre-op instructions  If you have any questions regarding the instructions, please do not hesitate to call our office.  [place]: [address], [place], [place] [address] -- [phone]  [place]: [address] [place], [place] [address] -- [phone]  [place]: [address]  [place], [place] [address] -- [phone]  [place]: [address], [address], [place], [place] [address] -- [phone]  Website:  [email or web address] 

## 2016-09-08 NOTE — Progress Notes (Signed)
Subjective:    Patient ID: Alex Martin, male   DOB: 76 y.o.   MRN: [id number]   HPI patient has a lot of discomfort in the second digit still noted and stated he had minimal response to medication and trimming    ROS      Objective:  Physical Exam neurovascular status intact with enlargement of the interphalangeal joint of the right second toe that's painful when pressed with keratotic lesion on the inner side second toe that's very painful     Assessment:  Chronic hammertoe deformity second digit right that's failed to respond to conservative care       Plan:   Condition reviewed and at this point I've recommended correction. I do think that we did not receive response to the conservative trimming and injection that we attempted and that this is can require surgical procedure. I went ahead and discussed arthroplasty and allowed him to read consent form reviewing correction and alternative treatments complications. Patient is noted guarantee as far success surgery wants surgery signed consent form and is scheduled for outpatient surgery understanding recovery of a total nature to take 6 months and it's possible open of pain in this toe. Patient is encouraged to call with any questions prior to procedure

## 2016-09-14 ENCOUNTER — Telehealth: Payer: Self-pay | Admitting: *Deleted

## 2016-09-14 NOTE — Telephone Encounter (Signed)
"  I want to reschedule a surgery if possible.  Thank you."

## 2016-09-15 NOTE — Telephone Encounter (Signed)
I called and left patient a message that I rescheduled his appointment.  I had received a call from [place] at [facility].  Surgery was rescheduled from 09/20/2016 to 09/27/2016.

## 2016-09-23 ENCOUNTER — Telehealth: Payer: Self-pay | Admitting: *Deleted
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
## 2016-09-28 ENCOUNTER — Encounter: Payer: Medicare Other | Admitting: Podiatry

## 2016-10-03 ENCOUNTER — Encounter: Payer: Medicare Other | Admitting: Podiatry

## 2016-10-04 ENCOUNTER — Encounter: Payer: Self-pay | Admitting: Podiatry

## 2016-10-04 DIAGNOSIS — M2041 Other hammer toe(s) (acquired), right foot: Secondary | ICD-10-CM | POA: Diagnosis not present

## 2016-10-05 ENCOUNTER — Telehealth: Payer: Self-pay | Admitting: *Deleted

## 2016-10-05 NOTE — Progress Notes (Signed)
DOS 10/04/2016 Hammertoe Repair 2nd RT

## 2016-10-05 NOTE — Telephone Encounter (Signed)
Left message for patient at [phone] (Home #) to call me back regarding his surgery that was performed on Tuesday, October 04, 2016 with Dr. Paulla Dolly. Pt had an Hammertoe Repair 2nd RT.  Waiting for patient to call me back.

## 2016-10-05 NOTE — Telephone Encounter (Signed)
Patient called me back at 1:42 pm regarding his surgery on 10/04/16 for Hammertoe Repair 2nd toe.  Pt stated, "Everything is doing good; has had some drainage from bandage, but the blood was dark red. Pain is 3-4/10, but has taken some pain pills today to help with the pain. I have had no fever, chills or nausea or vomiting". I told patient that the drainage they had was normal in color.  Pt also stated, "I had an excellent experience at the [facility]. Everyone is really great there".  Pt is elevating their foot and wearing their shoe.  Pt has their first Postop appointment with Dr. Paulla Dolly on Monday, October 10, 2016 at 9:45 am.

## 2016-10-10 ENCOUNTER — Ambulatory Visit (INDEPENDENT_AMBULATORY_CARE_PROVIDER_SITE_OTHER): Payer: Medicare Other

## 2016-10-10 ENCOUNTER — Ambulatory Visit (INDEPENDENT_AMBULATORY_CARE_PROVIDER_SITE_OTHER): Payer: Self-pay | Admitting: Podiatry

## 2016-10-10 ENCOUNTER — Encounter: Payer: Medicare Other | Admitting: Podiatry

## 2016-10-10 ENCOUNTER — Encounter: Payer: Self-pay | Admitting: Podiatry

## 2016-10-10 VITALS — BP 139/73 | HR 66 | Temp 98.0°F

## 2016-10-10 DIAGNOSIS — M2041 Other hammer toe(s) (acquired), right foot: Secondary | ICD-10-CM | POA: Diagnosis not present

## 2016-10-11 NOTE — Progress Notes (Signed)
Subjective:    Patient ID: Alex Martin, male   DOB: 76 y.o.   MRN: [id number]   HPI patient states doing well with minimal discomfort    ROS      Objective:  Physical Exam neurovascular status intact negative Homans sign noted with patient's wound edges right second digit well coapted and good alignment     Assessment:    Doing well post arthroplasty digit 2 right     Plan:    Reapplied sterile dressing reviewed x-ray debrided lesion and reappoint to recheck  X-ray indicates satisfactory resection of the head of the proximal phalanx digit 2 right

## 2016-10-12 ENCOUNTER — Encounter: Payer: Medicare Other | Admitting: Podiatry

## 2016-10-14 ENCOUNTER — Other Ambulatory Visit: Payer: Self-pay | Admitting: Family Medicine

## 2016-10-21 ENCOUNTER — Ambulatory Visit (INDEPENDENT_AMBULATORY_CARE_PROVIDER_SITE_OTHER): Payer: Self-pay | Admitting: Podiatry

## 2016-10-21 DIAGNOSIS — M2041 Other hammer toe(s) (acquired), right foot: Secondary | ICD-10-CM

## 2016-10-21 NOTE — Progress Notes (Signed)
Subjective:    Patient ID: Alex Martin, male   DOB: 76 y.o.   MRN: [id number]   HPI patient states doing great with this toe    ROS      Objective:  Physical Exam neurovascular status intact negative Homans sign noted with second digit right healing well wound edges well coapted digits and good alignment     Assessment:     Doing well post arthroplasty digit 2 right     Plan:    Because of elevation mild swelling toe I did x-ray and today stitches removed wound edges coapted well and I advised on plantar flexion of the toe and it should heal gradually over the next 12 weeks. Reappoint to recheck  X-rays indicate satisfactory section of bone with mild elevation of the digit

## 2016-11-09 ENCOUNTER — Telehealth: Payer: Self-pay | Admitting: Podiatry

## 2016-11-09 NOTE — Telephone Encounter (Signed)
I had called earlier this morning and left a message. I had hammertoe surgery a month ago and my toe is still red and a little puffy. I placed the pt on hold and buzzed Estill Bamberg to get pt scheduled ASAP. After speaking with pt, I transferred him to Estill Bamberg to get him scheduled to come in and be seen.

## 2016-11-09 NOTE — Telephone Encounter (Signed)
I had surgery for a hammertoe about a month ago. I don't think it looks right and I was wanting to know if someone could look at it. If you could please call me back at [phone]. Thank you.

## 2016-11-09 NOTE — Telephone Encounter (Signed)
Pt has an appt with Dr. Paulla Dolly 11/10/2016.

## 2016-11-10 ENCOUNTER — Ambulatory Visit (INDEPENDENT_AMBULATORY_CARE_PROVIDER_SITE_OTHER): Payer: Self-pay | Admitting: Podiatry

## 2016-11-10 ENCOUNTER — Ambulatory Visit (INDEPENDENT_AMBULATORY_CARE_PROVIDER_SITE_OTHER): Payer: Medicare Other

## 2016-11-10 ENCOUNTER — Encounter: Payer: Self-pay | Admitting: Podiatry

## 2016-11-10 DIAGNOSIS — M2041 Other hammer toe(s) (acquired), right foot: Secondary | ICD-10-CM | POA: Diagnosis not present

## 2016-11-10 NOTE — Progress Notes (Signed)
Subjective:    Patient ID: Alex Martin, male   DOB: 76 y.o.   MRN: [id number]   HPI patient states having minimal discomfort but still has some swelling of his toe    ROS      Objective:  Physical Exam neurovascular status intact with patient second digit showing mild edema right foot but no keratotic lesion formation     Assessment:    Edema associated with surgery but overall doing well     Plan:    Explaining normally have edema reviewed x-rays and at this point patient's discharge will wear wider shoes and will be seen back as needed  X-ray indicated satisfactory section of bone pressure had a proximal phalanx with mild elevation of the second toe

## 2016-11-22 ENCOUNTER — Encounter: Payer: Self-pay | Admitting: Family Medicine

## 2016-11-22 ENCOUNTER — Ambulatory Visit (INDEPENDENT_AMBULATORY_CARE_PROVIDER_SITE_OTHER): Payer: Medicare Other | Admitting: Family Medicine

## 2016-11-22 VITALS — BP 150/72 | HR 64 | Temp 97.9°F | Resp 14 | Ht 67.0 in | Wt 172.0 lb

## 2016-11-22 DIAGNOSIS — M5412 Radiculopathy, cervical region: Secondary | ICD-10-CM | POA: Diagnosis not present

## 2016-11-22 DIAGNOSIS — I10 Essential (primary) hypertension: Secondary | ICD-10-CM | POA: Diagnosis not present

## 2016-11-22 MED ORDER — PREDNISONE 20 MG PO TABS: ORAL_TABLET | ORAL | 0 refills | Status: DC

## 2016-11-22 MED ORDER — AMLODIPINE BESYLATE 10 MG PO TABS: 10.0000 mg | ORAL_TABLET | Freq: Every day | ORAL | 3 refills | Status: DC

## 2016-11-22 NOTE — Progress Notes (Signed)
Subjective:    Patient ID: Alex Martin, male    DOB: 09-Feb-1940, 76 y.o.   MRN: [id number]  Hypertension     06/2016 Patient is here today for complete physical exam. His only concern are senile purpura on the dorsums of both forearms.  Regarding his immunizations. He is due for Pneumovax 23. He has had Prevnar 13 Zostavax as well as his tetanus shot. He does not require hepatitis C screening as he falls outside the range. We also discussed the risk factors for this and he declines any possible exposure. His last colonoscopy was in 2011 and was normal. He is due again in 2021. He sees urologist who is following his PSAs and doing digital rectal exams. His last PSA was 0.31 recently with his urologist. His blood pressure today is elevated significantly.  AT that time, my plan was: Physical exam today is completely normal except for his elevated blood pressure. Patient elects to buy a blood pressure cuff and check his blood pressure readings over the next 2 weeks and then call me and notify me of the values. If his blood pressures consistently greater than 140/90, we will need to adjust his losartan to achieve a goal blood pressure less than 140/90. Patient received Pneumovax 23 today. His immunizations are otherwise up-to-date. His cancer screening is up-to-date. I would like the patient to return fasting for CBC, CMP, fasting lipid panel at his convenience  08/25/16 Ultimately, the patient called back with elevated readings that supported the idea of increasing his medication. The patient was started on Hyzaar 100/12.5 one by mouth daily and his previous losartan was discontinued. He is here today for recheck. Since starting the medication, his average blood pressure has been 143/86 according to his meter. We have a similar reading here today. While improved, it still not at goal.  AT that time, my plan was: I outlined his options including discontinuing Hyzaar in switching to losartan 100 mg a day  with amlodipine 10 mg a day. He ultimately decided to stick with Hyzaar and work on therapeutic lifestyle changes to address his blood pressure including 10 pounds weight loss, and a low-sodium diet, and increasing his aerobic exercise to 30 minutes a day 5 days a week. Recheck in 3 months  11/22/16 Patient's blood pressure is no better.  He is not checking it at home but today is 150/72.  He denies any chest pain or shortness of breath or dyspnea on exertion.  He has been compliant with Hyzaar.  He also complains of pain emanating from the left side of his neck, radiating into his left shoulder, radiating down his left arm to his elbow.  He also reports some numbness and tingling in his fingertips on his left hand.  He believes it is all 5 fingers.  MRI of his neck in 2010 revealed multiple levels of degenerative disc disease but there was no nerve impingement at that time. Past Medical History:  Diagnosis Date  . Abdominal distension    UMBILICAL HERNIA-NO PAIN  . Bleeding from the nose    SEVERAL EPISODES THAT REQUIRED TX IN ER--LAST EPISODE WAS APPROX 1 YR AGO  . Blood in urine    MICROSCOPIC BLOODEVALUATED BY DR. NESI FRIDAY 02/11/11--URINE SAMPLE AND EXAM-- AND BLOOD SAMPLE AND TO BE SEEN AGAIN IN 6 MONTHS AND GIVEN VESICARE  FOR FREQUENCY AND URGENCY  . GERD (gastroesophageal reflux disease)   . Hypertension    Past Surgical History:  Procedure Laterality Date  .  [place]   Current Outpatient Medications on File Prior to Visit  Medication Sig Dispense Refill  . aspirin EC 81 MG tablet Take 81 mg by mouth daily.    Marland Kitchen atorvastatin (LIPITOR) 20 MG tablet TAKE 1 TABLET (20 MG TOTAL) BY MOUTH DAILY. 90 tablet 3  . Cyanocobalamin (VITAMIN B-12 PO) Take 1 tablet by mouth daily.    Marland Kitchen latanoprost (XALATAN) 0.005 % ophthalmic solution 1 DROP IN BOTH EYES AT BEDTIME  0  . losartan-hydrochlorothiazide (HYZAAR) 100-12.5 MG tablet Take 1 tablet by mouth daily. 90  tablet 1  . Multiple Vitamin (MULTIVITAMIN) tablet Take 1 tablet by mouth daily.    Marland Kitchen NEXIUM 40 MG capsule TAKE 1 CAPSULE (40 MG TOTAL) BY MOUTH DAILY. 90 capsule 3  . valACYclovir (VALTREX) 1000 MG tablet Take 2 tablets (2,000 mg total) by mouth 2 (two) times daily. X 1 day 8 tablet 3  . VESICARE 5 MG tablet TAKE 1 TABLET BY MOUTH DAILY 90 tablet 3   No current facility-administered medications on file prior to visit.    Allergies  Allergen Reactions  . Shellfish Allergy     N&V AFTER EATING MUSSELS   Social History   Socioeconomic History  . Marital status: Single    Spouse name: Not on file  . Number of children: Not on file  . Years of education: Not on file  . Highest education level: Not on file  Social Needs  . Financial resource strain: Not on file  . Food insecurity - worry: Not on file  . Food insecurity - inability: Not on file  . Transportation needs - medical: Not on file  . Transportation needs - non-medical: Not on file  Occupational History  . Not on file  Tobacco Use  . Smoking status: Former Smoker    Years: 10.00    Last attempt to quit: 02/22/1992    Years since quitting: 24.7  . Smokeless tobacco: Never Used  . Tobacco comment: QUIT SMOKING ABOUT 1994 - 1 PP WEEK  Substance and Sexual Activity  . Alcohol use: Yes    Alcohol/week: 0.0 oz    Comment: 2 GLASSES WINE DAILY  . Drug use: No  . Sexual activity: Not on file  Other Topics Concern  . Not on file  Social History Narrative  . Not on file      Review of Systems  All other systems reviewed and are negative.      Objective:   Physical Exam  Constitutional: He is oriented to person, place, and time. He appears well-developed and well-nourished. No distress.  Neck: Normal range of motion. Neck supple. No tracheal deviation present. No thyromegaly present.  Cardiovascular: Normal rate, regular rhythm, normal heart sounds and intact distal pulses. Exam reveals no gallop and no friction rub.    No murmur heard. Pulmonary/Chest: Effort normal and breath sounds normal. No respiratory distress. He has no wheezes. He has no rales. He exhibits no tenderness.  Abdominal: Soft. Bowel sounds are normal.  Musculoskeletal: Normal range of motion. He exhibits no edema, tenderness or deformity.       Left shoulder: He exhibits normal range of motion, no tenderness, no bony tenderness, no effusion, no crepitus, no pain, no spasm and normal strength.  Neurological: He is alert and oriented to person, place, and time. He has normal reflexes. No cranial nerve deficit. He exhibits normal muscle tone. Coordination normal.  Skin: He is  not diaphoretic.  Vitals reviewed.         Assessment & Plan:   Benign essential HTN - Plan: amLODipine (NORVASC) 10 MG tablet  Cervical radiculopathy - Plan: predniSONE (DELTASONE) 20 MG tablet  Patient's blood pressure today is elevated.  Add amlodipine 10 mg a day to his Hyzaar and recheck blood pressure in 1 month.  I believe the patient's left shoulder pain secondary to cervical radiculopathy.  We would like to try prednisone taper pack to see if symptoms would improve.  If not, I would proceed with imaging of the neck.  Left shoulder exam is completely normal.  I believe it is referred pain

## 2016-11-24 ENCOUNTER — Other Ambulatory Visit: Payer: Self-pay | Admitting: Family Medicine

## 2016-11-24 DIAGNOSIS — M5412 Radiculopathy, cervical region: Secondary | ICD-10-CM

## 2016-11-24 MED ORDER — PREDNISONE 20 MG PO TABS: ORAL_TABLET | ORAL | 0 refills | Status: DC

## 2016-12-30 ENCOUNTER — Ambulatory Visit: Payer: Medicare Other

## 2016-12-30 VITALS — BP 136/82 | HR 66

## 2016-12-30 DIAGNOSIS — I10 Essential (primary) hypertension: Secondary | ICD-10-CM

## 2017-01-21 ENCOUNTER — Other Ambulatory Visit: Payer: Self-pay | Admitting: Family Medicine

## 2017-04-11 ENCOUNTER — Ambulatory Visit: Payer: Medicare Other | Admitting: Family Medicine

## 2017-04-11 ENCOUNTER — Other Ambulatory Visit: Payer: Self-pay

## 2017-04-11 ENCOUNTER — Encounter: Payer: Self-pay | Admitting: Family Medicine

## 2017-04-11 VITALS — BP 132/70 | HR 61 | Temp 97.8°F | Resp 14 | Ht 67.0 in | Wt 175.2 lb

## 2017-04-11 DIAGNOSIS — R21 Rash and other nonspecific skin eruption: Secondary | ICD-10-CM | POA: Diagnosis not present

## 2017-04-11 MED ORDER — TRIAMCINOLONE ACETONIDE 0.1 % EX CREA: 1.0000 "application " | TOPICAL_CREAM | Freq: Two times a day (BID) | CUTANEOUS | 0 refills | Status: DC | PRN

## 2017-04-11 NOTE — Progress Notes (Signed)
   Subjective:    Patient ID: Alex Martin, male    DOB: 08/13/1940, 77 y.o.   MRN: [id number]  Chief Complaint  Patient presents with  . rash spots on lower legs    and few on arms      HPI  Pt presents with itchy rash to bilateral LE x 2 weeks, which began after working out in his yard.  He suspects poison oak.  No treatments attempted.  The rash does not seem to be improving, but is not worsening either.  No swelling, drainage.  No one else at home with rash.  No other associated sx.    Past Medical History:  Diagnosis Date  . Abdominal distension    UMBILICAL HERNIA-NO PAIN  . Bleeding from the nose    SEVERAL EPISODES THAT REQUIRED TX IN ER--LAST EPISODE WAS APPROX 1 YR AGO  . Blood in urine    MICROSCOPIC BLOODEVALUATED BY DR. NESI FRIDAY 02/11/11--URINE SAMPLE AND EXAM-- AND BLOOD SAMPLE AND TO BE SEEN AGAIN IN 6 MONTHS AND GIVEN VESICARE  FOR FREQUENCY AND URGENCY  . GERD (gastroesophageal reflux disease)   . Hypertension    Scheduled Meds: Continuous Infusions: PRN Meds:.    Review of Systems  Constitutional: Negative.   HENT: Negative.   Eyes: Negative.   Respiratory: Negative for shortness of breath and wheezing.   Cardiovascular: Negative.  Negative for leg swelling.  Endocrine: Negative.   Genitourinary: Negative.   Musculoskeletal: Negative.   Skin: Positive for rash. Negative for color change and pallor.  Neurological: Negative.   Psychiatric/Behavioral: Negative.        Objective:   Physical Exam  Constitutional: He appears well-developed.  HENT:  Head: Normocephalic and atraumatic.  Nose: Nose normal.  Eyes: Conjunctivae are normal. Right eye exhibits no discharge. Left eye exhibits no discharge.  Neck: No tracheal deviation present.  Cardiovascular: Normal rate and regular rhythm.  Pulmonary/Chest: Effort normal. No stridor. No respiratory distress.  Musculoskeletal: Normal range of motion.  Neurological: He is alert. He exhibits normal  muscle tone. Coordination normal.  Skin: Skin is warm and dry. Rash noted. Rash is maculopapular. He is not diaphoretic.  Scattered 5-10 mm erythematous maculopapular rash to bilateral lower extremities.  No surrounding edema, erythema, induration.  Minor excoriations Skin generally dry and flaking.  Psychiatric: He has a normal mood and affect. His behavior is normal.  Nursing note and vitals reviewed.  Vitals:   04/11/17 1202  BP: 132/70  Pulse: 61  Resp: 14  Temp: 97.8 F (36.6 C)  SpO2: 98%         Assessment & Plan:   Encounter Diagnosis  Name Primary?  . Rash and nonspecific skin eruption Yes   Possible etiology poison oak - per pt.  Rash is mild, sx not worsening, and exam currently not consistent with or concerning for contact dermatitis. Will tx with topical steroids and hydration.  Pt advised to take zyrtec for itching, caution with benadryl.  Advised to call or come in for recheck if it worsens.

## 2017-04-11 NOTE — Patient Instructions (Signed)
Apply steroid cream sparingly as needed, starting 2x a day and then decreasing to once a day after it improves.  Try to use for less then 1-2 weeks.   Hydrate your skin with heavy ointments like eucerin, aquaphor.  Call us or return for recheck if you worsen.  At that point you may need oral steroids or other treatment.   Rash A rash is a change in the color of the skin. A rash can also change the way your skin feels. There are many different conditions and factors that can cause a rash. Follow these instructions at home: Pay attention to any changes in your symptoms. Follow these instructions to help with your condition: Medicine Take or apply over-the-counter and prescription medicines only as told by your doctor. These may include:  Corticosteroid cream.  Anti-itch lotions.  Oral antihistamines.  Skin Care  Put cool compresses on the affected areas.  Try taking a bath with: ? Epsom salts. Follow the instructions on the packaging. You can get these at your local pharmacy or grocery store. ? Baking soda. Pour a small amount into the bath as told by your doctor. ? Colloidal oatmeal. Follow the instructions on the packaging. You can get this at your local pharmacy or grocery store.  Try putting baking soda paste onto your skin. Stir water into baking soda until it gets like a paste.  Do not scratch or rub your skin.  Avoid covering the rash. Make sure the rash is exposed to air as much as possible. General instructions  Avoid hot showers or baths, which can make itching worse. A cold shower may help.  Avoid scented soaps, detergents, and perfumes. Use gentle soaps, detergents, perfumes, and other cosmetic products.  Avoid anything that causes your rash. Keep a journal to help track what causes your rash. Write down: ? What you eat. ? What cosmetic products you use. ? What you drink. ? What you wear. This includes jewelry.  Keep all follow-up visits as told by your doctor.  This is important. Contact a doctor if:  You sweat at night.  You lose weight.  You pee (urinate) more than normal.  You feel weak.  You throw up (vomit).  Your skin or the whites of your eyes look yellow (jaundice).  Your skin: ? Tingles. ? Is numb.  Your rash: ? Does not go away after a few days. ? Gets worse.  You are: ? More thirsty than normal. ? More tired than normal.  You have: ? New symptoms. ? Pain in your belly (abdomen). ? A fever. ? Watery poop (diarrhea). Get help right away if:  Your rash covers all or most of your body. The rash may or may not be painful.  You have blisters that: ? Are on top of the rash. ? Grow larger. ? Grow together. ? Are painful. ? Are inside your nose or mouth.  You have a rash that: ? Looks like purple pinprick-sized spots all over your body. ? Has a "bull's eye" or looks like a target. ? Is red and painful, causes your skin to peel, and is not from being in the sun too long. This information is not intended to replace advice given to you by your health care provider. Make sure you discuss any questions you have with your health care provider. Document Released: 06/22/2007 Document Revised: 06/11/2015 Document Reviewed: 05/21/2014 Elsevier Interactive Patient Education  2018 [employer] Dermatitis Poison oak dermatitis is redness and  soreness (inflammation) of the skin. It is caused by a chemical that is on the leaves of the poison oak plant. You may also have itching, a rash, and blisters. Symptoms often clear up in 1-2 weeks. You may get this condition by touching a poison oak plant. You can also get it by touching something that has the chemical on it. This may include animals or objects that have come in contact with the plant. Follow these instructions at home: General instructions  Take or apply over-the-counter and prescription medicines only as told by your doctor.  If you touch poison oak,  wash your skin with soap and cold water right away.  Use hydrocortisone creams or calamine lotion as needed to help with itching.  Take oatmeal baths as needed. Use colloidal oatmeal. You can get this at a pharmacy or grocery store. Follow the instructions on the package.  Do not scratch or rub your skin.  While you have the rash, wash your clothes right after you wear them. Prevention   Know what poison oak looks like so you can avoid it. This plant has three leaves with flowering branches on a single stem. The leaves are fuzzy. They have a toothlike edge.  If you have touched poison oak, wash with soap and water right away. Be sure to wash under your fingernails.  When hiking or camping, wear long pants, a long-sleeved shirt, tall socks, and hiking boots. You can also use a lotion on your skin that helps to prevent contact with the chemical on the plant.  If you think that your clothes or outdoor gear came in contact with poison oak, rinse them off with a garden hose before you bring them inside your house. Contact a doctor if:  You have open sores in the rash area.  You have more redness, swelling, or pain in the affected area.  You have redness that spreads beyond the rash area.  You have fluid, blood, or pus coming from the affected area.  You have a fever.  You have a rash over a large area of your body.  You have a rash on your eyes, mouth, or genitals.  Your rash does not improve after a few days. Get help right away if:  Your face swells or your eyes swell shut.  You have trouble breathing.  You have trouble swallowing. This information is not intended to replace advice given to you by your health care provider. Make sure you discuss any questions you have with your health care provider. Document Released: 02/05/2010 Document Revised: 06/11/2015 Document Reviewed: 06/11/2014 Elsevier Interactive Patient Education  [employer].

## 2017-04-23 ENCOUNTER — Other Ambulatory Visit: Payer: Self-pay | Admitting: Family Medicine

## 2017-06-08 ENCOUNTER — Encounter: Payer: Self-pay | Admitting: Family Medicine

## 2017-06-08 ENCOUNTER — Other Ambulatory Visit: Payer: Self-pay

## 2017-06-08 ENCOUNTER — Ambulatory Visit: Payer: Medicare Other | Admitting: Family Medicine

## 2017-06-08 VITALS — BP 130/68 | HR 60 | Temp 97.9°F | Resp 12 | Ht 67.0 in | Wt 175.0 lb

## 2017-06-08 DIAGNOSIS — I10 Essential (primary) hypertension: Secondary | ICD-10-CM

## 2017-06-08 DIAGNOSIS — M7989 Other specified soft tissue disorders: Secondary | ICD-10-CM | POA: Diagnosis not present

## 2017-06-08 LAB — BASIC METABOLIC PANEL WITH GFR
BUN: 20 mg/dL (ref 7–25)
CO2: 27 mmol/L (ref 20–32)
Calcium: 8.6 mg/dL (ref 8.6–10.3)
Chloride: 104 mmol/L (ref 98–110)
Creat: 0.94 mg/dL (ref 0.70–1.18)
GFR, Est African American: 91 mL/min/{1.73_m2} (ref >=60)
GFR, Est Non African American: 78 mL/min/{1.73_m2} (ref >=60)
GLUCOSE: 97 mg/dL (ref 65–99)
POTASSIUM: 4.3 mmol/L (ref 3.5–5.3)
SODIUM: 140 mmol/L (ref 135–146)

## 2017-06-08 LAB — EXTRA LAV TOP TUBE

## 2017-06-08 NOTE — Progress Notes (Signed)
Subjective:    Patient ID: Alex Martin, male    DOB: May 11, 1940, 77 y.o.   MRN: [id number]  Hypertension     06/2016 Patient is here today for complete physical exam. His only concern are senile purpura on the dorsums of both forearms.  Regarding his immunizations. He is due for Pneumovax 23. He has had Prevnar 13 Zostavax as well as his tetanus shot. He does not require hepatitis C screening as he falls outside the range. We also discussed the risk factors for this and he declines any possible exposure. His last colonoscopy was in 2011 and was normal. He is due again in 2021. He sees urologist who is following his PSAs and doing digital rectal exams. His last PSA was 0.31 recently with his urologist. His blood pressure today is elevated significantly.  AT that time, my plan was: Physical exam today is completely normal except for his elevated blood pressure. Patient elects to buy a blood pressure cuff and check his blood pressure readings over the next 2 weeks and then call me and notify me of the values. If his blood pressures consistently greater than 140/90, we will need to adjust his losartan to achieve a goal blood pressure less than 140/90. Patient received Pneumovax 23 today. His immunizations are otherwise up-to-date. His cancer screening is up-to-date. I would like the patient to return fasting for CBC, CMP, fasting lipid panel at his convenience  08/25/16 Ultimately, the patient called back with elevated readings that supported the idea of increasing his medication. The patient was started on Hyzaar 100/12.5 one by mouth daily and his previous losartan was discontinued. He is here today for recheck. Since starting the medication, his average blood pressure has been 143/86 according to his meter. We have a similar reading here today. While improved, it still not at goal.  AT that time, my plan was: I outlined his options including discontinuing Hyzaar in switching to losartan 100 mg a day  with amlodipine 10 mg a day. He ultimately decided to stick with Hyzaar and work on therapeutic lifestyle changes to address his blood pressure including 10 pounds weight loss, and a low-sodium diet, and increasing his aerobic exercise to 30 minutes a day 5 days a week. Recheck in 3 months  11/22/16 Patient's blood pressure is no better.  He is not checking it at home but today is 150/72.  He denies any chest pain or shortness of breath or dyspnea on exertion.  He has been compliant with Hyzaar.  He also complains of pain emanating from the left side of his neck, radiating into his left shoulder, radiating down his left arm to his elbow.  He also reports some numbness and tingling in his fingertips on his left hand.  He believes it is all 5 fingers.  MRI of his neck in 2010 revealed multiple levels of degenerative disc disease but there was no nerve impingement at that time.  At that time, my plan was: Patient's blood pressure today is elevated.  Add amlodipine 10 mg a day to his Hyzaar and recheck blood pressure in 1 month.  I believe the patient's left shoulder pain secondary to cervical radiculopathy.  We would like to try prednisone taper pack to see if symptoms would improve.  If not, I would proceed with imaging of the neck.  Left shoulder exam is completely normal.  I believe it is referred pain  06/08/17 Recently, the patient has noticed swelling in both legs however the left  leg is slightly worse than the right leg.  The swelling is mild.  It tends to be an indentation above the level of his sock toward the end of the day.  He denies any pain in his legs.  There is no erythema or warmth in his legs.  He has a negative Homans sign bilaterally.  Edema today on exam is trace at best to the level of the mid shin.  He denies any orthopnea.  He denies any paroxysmal nocturnal dyspnea.  He denies any chest pain.  He does have some mild fatigue. Past Medical History:  Diagnosis Date  . Abdominal distension     UMBILICAL HERNIA-NO PAIN  . Bleeding from the nose    SEVERAL EPISODES THAT REQUIRED TX IN ER--LAST EPISODE WAS APPROX 1 YR AGO  . Blood in urine    MICROSCOPIC BLOODEVALUATED BY DR. NESI FRIDAY 02/11/11--URINE SAMPLE AND EXAM-- AND BLOOD SAMPLE AND TO BE SEEN AGAIN IN 6 MONTHS AND GIVEN VESICARE  FOR FREQUENCY AND URGENCY  . GERD (gastroesophageal reflux disease)   . Hypertension    Past Surgical History:  Procedure Laterality Date  . HERNIA REPAIR  1959   [facility]  . UMBILICAL HERNIA REPAIR  02/15/2011   Procedure: HERNIA REPAIR UMBILICAL ADULT;  Surgeon: Odis Hollingshead, MD;  Location: [facility];  Service: General;  Laterality: N/A;  umbilical hernia repair with mesh  . VASECTOMY  1976   Current Outpatient Medications on File Prior to Visit  Medication Sig Dispense Refill  . amLODipine (NORVASC) 10 MG tablet Take 1 tablet (10 mg total) daily by mouth. 90 tablet 3  . aspirin EC 81 MG tablet Take 81 mg by mouth daily.    Marland Kitchen atorvastatin (LIPITOR) 20 MG tablet TAKE 1 TABLET (20 MG TOTAL) BY MOUTH DAILY. 90 tablet 3  . Cyanocobalamin (VITAMIN B-12 PO) Take 1 tablet by mouth daily.    Marland Kitchen latanoprost (XALATAN) 0.005 % ophthalmic solution 1 DROP IN BOTH EYES AT BEDTIME  0  . losartan-hydrochlorothiazide (HYZAAR) 100-12.5 MG tablet TAKE 1 TABLET BY MOUTH EVERY DAY 90 tablet 3  . Multiple Vitamin (MULTIVITAMIN) tablet Take 1 tablet by mouth daily.    Marland Kitchen NEXIUM 40 MG capsule TAKE 1 CAPSULE (40 MG TOTAL) BY MOUTH DAILY. 90 capsule 3  . triamcinolone cream (KENALOG) 0.1 % Apply 1 application topically 2 (two) times daily as needed. 30 g 0  . valACYclovir (VALTREX) 1000 MG tablet Take 2 tablets (2,000 mg total) by mouth 2 (two) times daily. X 1 day 8 tablet 3  . VESICARE 5 MG tablet TAKE 1 TABLET BY MOUTH DAILY 90 tablet 3   No current facility-administered medications on file prior to visit.    Allergies  Allergen Reactions  . Shellfish Allergy     N&V AFTER EATING MUSSELS   Social History    Socioeconomic History  . Marital status: Single    Spouse name: Not on file  . Number of children: Not on file  . Years of education: Not on file  . Highest education level: Not on file  Occupational History  . Not on file  Social Needs  . Financial resource strain: Not on file  . Food insecurity:    Worry: Not on file    Inability: Not on file  . Transportation needs:    Medical: Not on file    Non-medical: Not on file  Tobacco Use  . Smoking status: Former Smoker    Years: 10.00  Last attempt to quit: 02/22/1992    Years since quitting: 25.3  . Smokeless tobacco: Never Used  . Tobacco comment: QUIT SMOKING ABOUT 1994 - 1 PP WEEK  Substance and Sexual Activity  . Alcohol use: Yes    Alcohol/week: 0.0 oz    Comment: 2 GLASSES WINE DAILY  . Drug use: No  . Sexual activity: Not on file  Lifestyle  . Physical activity:    Days per week: Not on file    Minutes per session: Not on file  . Stress: Not on file  Relationships  . Social connections:    Talks on phone: Not on file    Gets together: Not on file    Attends religious service: Not on file    Active member of club or organization: Not on file    Attends meetings of clubs or organizations: Not on file    Relationship status: Not on file  . Intimate partner violence:    Fear of current or ex partner: Not on file    Emotionally abused: Not on file    Physically abused: Not on file    Forced sexual activity: Not on file  Other Topics Concern  . Not on file  Social History Narrative  . Not on file      Review of Systems  All other systems reviewed and are negative.      Objective:   Physical Exam  Constitutional: He is oriented to person, place, and time. He appears well-developed and well-nourished. No distress.  Neck: Normal range of motion. Neck supple. No tracheal deviation present. No thyromegaly present.  Cardiovascular: Normal rate, regular rhythm, normal heart sounds and intact distal pulses.  Exam reveals no gallop and no friction rub.  No murmur heard. Pulmonary/Chest: Effort normal and breath sounds normal. No respiratory distress. He has no wheezes. He has no rales. He exhibits no tenderness.  Abdominal: Soft. Bowel sounds are normal.  Musculoskeletal: Normal range of motion. He exhibits no edema, tenderness or deformity.       Left shoulder: He exhibits normal range of motion, no tenderness, no bony tenderness, no effusion, no crepitus, no pain, no spasm and normal strength.  Neurological: He is alert and oriented to person, place, and time. He has normal reflexes. No cranial nerve deficit. He exhibits normal muscle tone. Coordination normal.  Skin: He is not diaphoretic.  Vitals reviewed.         Assessment & Plan:   Benign essential HTN  Leg swelling - Plan: BASIC METABOLIC PANEL WITH GFR  Patient's exam is completely normal.  There is no evidence of fluid overload/congestive heart failure.  I do not believe the patient has a DVT.  There is no physical stigmata of kidney failure liver failure.  I believe this is most likely due to the amlodipine.  We discussed this at length, and the patient is willing to accept this level of swelling as the medication is controlling his blood pressure well.  Will make no changes at this time.

## 2017-06-28 ENCOUNTER — Other Ambulatory Visit: Payer: Self-pay | Admitting: Family Medicine

## 2017-07-28 ENCOUNTER — Ambulatory Visit: Payer: Medicare Other

## 2017-07-28 ENCOUNTER — Telehealth: Payer: Self-pay | Admitting: Family Medicine

## 2017-07-28 VITALS — BP 132/70

## 2017-07-28 DIAGNOSIS — Z0130 Encounter for examination of blood pressure without abnormal findings: Secondary | ICD-10-CM

## 2017-07-28 NOTE — Telephone Encounter (Signed)
Left message return call

## 2017-07-28 NOTE — Telephone Encounter (Signed)
Spoke with patient and informed him that Dr.Pickard was aware of BP. States it was excellent. He does not recommend any changes. Start checking it more often to ensure it is consistently controlled. Patient verbalized understanding.

## 2017-07-28 NOTE — Telephone Encounter (Signed)
Blood pressure here was excellent. I would not make nay changes.  He could start checking it more often to ensure it is consistently well controlled.

## 2017-07-28 NOTE — Progress Notes (Signed)
Patient came in today for a blood pressure check. States that a nurse from his insurance company came out and checked his blood pressure and it was in the 099'Y systolic pressure and he unsure what the diastolic pressure was. Checked BP while patient was sitting in chair, in left arm with normal size cuff. Patient's BP was 132/70. Advised patient that I would notify the his PCP of readings to see if any medications changes need to be made. Patient verbalized understanding.

## 2017-07-28 NOTE — Telephone Encounter (Signed)
Patient walked in for a blood pressure check. States he came in because Nurse from his insurance company came by today and checked his BP and his systolic BP was in the 169'C. Patient reading in office today was 132/70. Would you like to make any changes. Please advise?

## 2017-08-16 ENCOUNTER — Other Ambulatory Visit: Payer: Self-pay | Admitting: Family Medicine

## 2017-10-04 ENCOUNTER — Other Ambulatory Visit: Payer: Self-pay | Admitting: Family Medicine

## 2017-10-04 MED ORDER — LOSARTAN POTASSIUM 100 MG PO TABS: 100.0000 mg | ORAL_TABLET | Freq: Every day | ORAL | 3 refills | Status: DC

## 2017-10-04 MED ORDER — HYDROCHLOROTHIAZIDE 12.5 MG PO CAPS: 12.5000 mg | ORAL_CAPSULE | Freq: Every day | ORAL | 3 refills | Status: DC

## 2017-10-11 ENCOUNTER — Ambulatory Visit: Payer: Medicare Other | Admitting: Family Medicine

## 2017-10-11 VITALS — BP 146/72

## 2017-10-11 DIAGNOSIS — Z0130 Encounter for examination of blood pressure without abnormal findings: Secondary | ICD-10-CM

## 2017-10-11 NOTE — Progress Notes (Signed)
Pt in for a BP check and states that he stopped taking the Amlodipine as it caused too much swelling in his ankles so he just quit taking.

## 2017-10-27 ENCOUNTER — Ambulatory Visit: Payer: Medicare Other | Admitting: Family Medicine

## 2017-10-31 ENCOUNTER — Ambulatory Visit: Payer: Medicare Other | Admitting: Family Medicine

## 2017-10-31 ENCOUNTER — Encounter: Payer: Self-pay | Admitting: Family Medicine

## 2017-10-31 VITALS — BP 136/70 | HR 58 | Temp 97.9°F | Resp 14 | Ht 67.0 in | Wt 176.0 lb

## 2017-10-31 DIAGNOSIS — I10 Essential (primary) hypertension: Secondary | ICD-10-CM

## 2017-10-31 DIAGNOSIS — H9113 Presbycusis, bilateral: Secondary | ICD-10-CM | POA: Diagnosis not present

## 2017-10-31 NOTE — Progress Notes (Signed)
Subjective:    Patient ID: Alex Martin, male    DOB: March 18, 1940, 77 y.o.   MRN: [id number]  Hypertension     Patient has a history of presbycusis in both ears.  He has been using hearing aids for the last 5 years.  However over the last few months, his hearing has progressively gotten worse and is requesting a referral back to his audiologist to have his hearing aids evaluated.  He denies any ear pain.  He denies any tinnitus.  He denies any headaches.  His blood pressure today is well controlled however he is taking both Hyzaar 100/12.5 p.o. daily and hydrochlorothiazide 12.5 p.o. daily.  This is an [employer].  His pharmacy did not have the combination pill at one point this year and therefore he was prescribed individual components.  However when he resumed the combination pill, he also continue to take 1 of the individual components along with it.  He denies any lightheadedness.  He denies any dry mouth.  He does report increased urinary frequency and is taking Vesicare for overactive bladder Past Medical History:  Diagnosis Date  . Abdominal distension    UMBILICAL HERNIA-NO PAIN  . Bleeding from the nose    SEVERAL EPISODES THAT REQUIRED TX IN ER--LAST EPISODE WAS APPROX 1 YR AGO  . Blood in urine    MICROSCOPIC BLOODEVALUATED BY DR. NESI FRIDAY 02/11/11--URINE SAMPLE AND EXAM-- AND BLOOD SAMPLE AND TO BE SEEN AGAIN IN 6 MONTHS AND GIVEN VESICARE  FOR FREQUENCY AND URGENCY  . GERD (gastroesophageal reflux disease)   . Hypertension    Past Surgical History:  Procedure Laterality Date  . HERNIA REPAIR  1959   [facility]  . UMBILICAL HERNIA REPAIR  02/15/2011   Procedure: HERNIA REPAIR UMBILICAL ADULT;  Surgeon: Odis Hollingshead, MD;  Location: [facility];  Service: General;  Laterality: N/A;  umbilical hernia repair with mesh  . VASECTOMY  1976   Current Outpatient Medications on File Prior to Visit  Medication Sig Dispense Refill  . aspirin EC 81 MG tablet Take 81 mg by mouth daily.      Marland Kitchen atorvastatin (LIPITOR) 20 MG tablet TAKE 1 TABLET (20 MG TOTAL) BY MOUTH DAILY. 90 tablet 3  . Cyanocobalamin (VITAMIN B-12 PO) Take 1 tablet by mouth daily.    Marland Kitchen latanoprost (XALATAN) 0.005 % ophthalmic solution 1 DROP IN BOTH EYES AT BEDTIME  0  . losartan-hydrochlorothiazide (HYZAAR) 100-12.5 MG tablet TAKE 1 TABLET BY MOUTH EVERY DAY 90 tablet 3  . Multiple Vitamin (MULTIVITAMIN) tablet Take 1 tablet by mouth daily.    Marland Kitchen NEXIUM 40 MG capsule TAKE 1 CAPSULE (40 MG TOTAL) BY MOUTH DAILY. 90 capsule 3  . triamcinolone cream (KENALOG) 0.1 % Apply 1 application topically 2 (two) times daily as needed. 30 g 0  . valACYclovir (VALTREX) 1000 MG tablet Take 2 tablets (2,000 mg total) by mouth 2 (two) times daily. X 1 day 8 tablet 3  . VESICARE 5 MG tablet TAKE 1 TABLET BY MOUTH EVERY DAY 90 tablet 3   No current facility-administered medications on file prior to visit.    Allergies  Allergen Reactions  . Amlodipine Swelling    swelling in ankles  . Shellfish Allergy     N&V AFTER EATING MUSSELS   Social History   Socioeconomic History  . Marital status: Single    Spouse name: Not on file  . Number of children: Not on file  . Years of education: Not on  file  . Highest education level: Not on file  Occupational History  . Not on file  Social Needs  . Financial resource strain: Not on file  . Food insecurity:    Worry: Not on file    Inability: Not on file  . Transportation needs:    Medical: Not on file    Non-medical: Not on file  Tobacco Use  . Smoking status: Former Smoker    Years: 10.00    Last attempt to quit: 02/22/1992    Years since quitting: 25.7  . Smokeless tobacco: Never Used  . Tobacco comment: QUIT SMOKING ABOUT 1994 - 1 PP WEEK  Substance and Sexual Activity  . Alcohol use: Yes    Comment: 2 GLASSES WINE DAILY  . Drug use: No  . Sexual activity: Not on file  Lifestyle  . Physical activity:    Days per week: Not on file    Minutes per session: Not on file   . Stress: Not on file  Relationships  . Social connections:    Talks on phone: Not on file    Gets together: Not on file    Attends religious service: Not on file    Active member of club or organization: Not on file    Attends meetings of clubs or organizations: Not on file    Relationship status: Not on file  . Intimate partner violence:    Fear of current or ex partner: Not on file    Emotionally abused: Not on file    Physically abused: Not on file    Forced sexual activity: Not on file  Other Topics Concern  . Not on file  Social History Narrative  . Not on file      Review of Systems  All other systems reviewed and are negative.      Objective:   Physical Exam  Constitutional: He is oriented to person, place, and time. He appears well-developed and well-nourished. No distress.  Neck: Normal range of motion. Neck supple. No tracheal deviation present. No thyromegaly present.  Cardiovascular: Normal rate, regular rhythm, normal heart sounds and intact distal pulses. Exam reveals no gallop and no friction rub.  No murmur heard. Pulmonary/Chest: Effort normal and breath sounds normal. No respiratory distress. He has no wheezes. He has no rales. He exhibits no tenderness.  Abdominal: Soft. Bowel sounds are normal.  Musculoskeletal: Normal range of motion. He exhibits no edema, tenderness or deformity.       Left shoulder: He exhibits normal range of motion, no tenderness, no bony tenderness, no effusion, no crepitus, no pain, no spasm and normal strength.  Neurological: He is alert and oriented to person, place, and time. He has normal reflexes. No cranial nerve deficit. He exhibits normal muscle tone. Coordination normal.  Skin: He is not diaphoretic.  Vitals reviewed.         Assessment & Plan:   Presbycusis of both ears  Benign essential HTN  I will happily refer the patient back to his audiologist to have his hearing aids evaluated.  There is no evidence of  a cerumen impaction or obstruction in the ear.  His blood pressure is well controlled but I want him to discontinue hydrochlorothiazide.  He should only be on Hyzaar 100/12.5 p.o. daily.  Recheck blood pressure in 3 weeks off the hydrochlorothiazide to ensure that the Hyzaar by itself is sufficient to manage his blood pressure.

## 2017-11-07 ENCOUNTER — Other Ambulatory Visit: Payer: Medicare Other

## 2017-11-07 DIAGNOSIS — K219 Gastro-esophageal reflux disease without esophagitis: Secondary | ICD-10-CM

## 2017-11-07 DIAGNOSIS — I10 Essential (primary) hypertension: Secondary | ICD-10-CM

## 2017-11-07 DIAGNOSIS — Z0000 Encounter for general adult medical examination without abnormal findings: Secondary | ICD-10-CM

## 2017-11-07 DIAGNOSIS — E7800 Pure hypercholesterolemia, unspecified: Secondary | ICD-10-CM

## 2017-11-07 DIAGNOSIS — Z125 Encounter for screening for malignant neoplasm of prostate: Secondary | ICD-10-CM

## 2017-11-07 LAB — COMPREHENSIVE METABOLIC PANEL
AG Ratio: 1.5 (calc) (ref 1.0–2.5)
ALT: 15 U/L (ref 9–46)
AST: 17 U/L (ref 10–35)
Albumin: 4 g/dL (ref 3.6–5.1)
Alkaline phosphatase (APISO): 63 U/L (ref 40–115)
BILIRUBIN TOTAL: 0.8 mg/dL (ref 0.2–1.2)
BUN: 22 mg/dL (ref 7–25)
CALCIUM: 9 mg/dL (ref 8.6–10.3)
CO2: 28 mmol/L (ref 20–32)
Chloride: 104 mmol/L (ref 98–110)
Creat: 1.08 mg/dL (ref 0.70–1.18)
GLUCOSE: 93 mg/dL (ref 65–99)
Globulin: 2.6 g/dL (calc) (ref 1.9–3.7)
Potassium: 4.8 mmol/L (ref 3.5–5.3)
SODIUM: 140 mmol/L (ref 135–146)
TOTAL PROTEIN: 6.6 g/dL (ref 6.1–8.1)

## 2017-11-07 LAB — CBC WITH DIFFERENTIAL/PLATELET
Basophils Absolute: 100 cells/uL (ref 0–200)
Basophils Relative: 2 %
EOS ABS: 240 {cells}/uL (ref 15–500)
Eosinophils Relative: 4.8 %
HCT: 42.6 % (ref 38.5–50.0)
Hemoglobin: 14.6 g/dL (ref 13.2–17.1)
Lymphs Abs: 1450 cells/uL (ref 850–3900)
MCH: 32.9 pg (ref 27.0–33.0)
MCHC: 34.3 g/dL (ref 32.0–36.0)
MCV: 95.9 fL (ref 80.0–100.0)
MPV: 10.4 fL (ref 7.5–12.5)
Monocytes Relative: 11.9 %
NEUTROS PCT: 52.3 %
Neutro Abs: 2615 cells/uL (ref 1500–7800)
PLATELETS: 231 10*3/uL (ref 140–400)
RBC: 4.44 10*6/uL (ref 4.20–5.80)
RDW: 11.9 % (ref 11.0–15.0)
TOTAL LYMPHOCYTE: 29 %
WBC: 5 10*3/uL (ref 3.8–10.8)
WBCMIX: 595 {cells}/uL (ref 200–950)

## 2017-11-07 LAB — LIPID PANEL
Cholesterol: 174 mg/dL (ref <200)
HDL: 56 mg/dL (ref >40)
LDL CHOLESTEROL (CALC): 97 mg/dL
Non-HDL Cholesterol (Calc): 118 mg/dL (calc) (ref <130)
TRIGLYCERIDES: 116 mg/dL (ref <150)
Total CHOL/HDL Ratio: 3.1 (calc) (ref <5.0)

## 2017-11-07 LAB — PSA: PSA: 0.4 ng/mL (ref <=4.0)

## 2017-11-14 ENCOUNTER — Ambulatory Visit (INDEPENDENT_AMBULATORY_CARE_PROVIDER_SITE_OTHER): Payer: Medicare Other | Admitting: Family Medicine

## 2017-11-14 ENCOUNTER — Encounter: Payer: Self-pay | Admitting: Family Medicine

## 2017-11-14 VITALS — BP 130/62 | HR 76 | Temp 98.0°F | Resp 16 | Ht 66.5 in | Wt 175.0 lb

## 2017-11-14 DIAGNOSIS — Z0000 Encounter for general adult medical examination without abnormal findings: Secondary | ICD-10-CM

## 2017-11-14 DIAGNOSIS — I10 Essential (primary) hypertension: Secondary | ICD-10-CM

## 2017-11-14 DIAGNOSIS — E7800 Pure hypercholesterolemia, unspecified: Secondary | ICD-10-CM

## 2017-11-14 NOTE — Progress Notes (Signed)
Subjective:    Patient ID: Alex Martin, male    DOB: [birth year], 77 y.o.   MRN: [id number]  HPI Patient is here today for complete physical exam.  He had flu shot at [facility].  He is due for shingrix.  Last colonoscopy was 09/11/2009.  Due for prostate cancer screening.  Patient has an appointment to see his urologist later this year for his digital rectal exam.  His PSA was recently checked and found to be 0.4.  This is essentially unchanged over the last 5 years.  He denies any falls.  He denies any depression.  He denies any difficulty with his memory.  His only concern is some pain and stiffness in his MCP joint and DIP joints.  This is mild.  He believes is arthritic. Immunization History  Administered Date(s) Administered  . Influenza, High Dose Seasonal PF 11/01/2012, 10/14/2015, 09/17/2016  . Influenza,inj,Quad PF,6+ Mos 10/29/2014  . Influenza-Unspecified 10/04/2013, 10/14/2015  . Pneumococcal Conjugate-13 10/29/2014  . Pneumococcal Polysaccharide-23 06/30/2016  . Tdap 06/17/2012  . Zoster 01/18/2007    Past Medical History:  Diagnosis Date  . Abdominal distension    UMBILICAL HERNIA-NO PAIN  . Bleeding from the nose    SEVERAL EPISODES THAT REQUIRED TX IN ER--LAST EPISODE WAS APPROX 1 YR AGO  . Blood in urine    MICROSCOPIC BLOODEVALUATED BY DR. NESI FRIDAY 02/11/11--URINE SAMPLE AND EXAM-- AND BLOOD SAMPLE AND TO BE SEEN AGAIN IN 6 MONTHS AND GIVEN VESICARE  FOR FREQUENCY AND URGENCY  . GERD (gastroesophageal reflux disease)   . Hypertension    Past Surgical History:  Procedure Laterality Date  . HERNIA REPAIR  1959   [facility]  . UMBILICAL HERNIA REPAIR  02/15/2011   Procedure: HERNIA REPAIR UMBILICAL ADULT;  Surgeon: Odis Hollingshead, MD;  Location: [facility];  Service: General;  Laterality: N/A;  umbilical hernia repair with mesh  . VASECTOMY  1976   Current Outpatient Medications on File Prior to Visit  Medication Sig Dispense Refill  . aspirin EC 81 MG tablet Take 81 mg  by mouth daily.    Marland Kitchen atorvastatin (LIPITOR) 20 MG tablet TAKE 1 TABLET (20 MG TOTAL) BY MOUTH DAILY. 90 tablet 3  . Cyanocobalamin (VITAMIN B-12 PO) Take 1 tablet by mouth daily.    Marland Kitchen latanoprost (XALATAN) 0.005 % ophthalmic solution 1 DROP IN BOTH EYES AT BEDTIME  0  . losartan-hydrochlorothiazide (HYZAAR) 100-12.5 MG tablet TAKE 1 TABLET BY MOUTH EVERY DAY 90 tablet 3  . Multiple Vitamin (MULTIVITAMIN) tablet Take 1 tablet by mouth daily.    Marland Kitchen NEXIUM 40 MG capsule TAKE 1 CAPSULE (40 MG TOTAL) BY MOUTH DAILY. 90 capsule 3  . VESICARE 5 MG tablet TAKE 1 TABLET BY MOUTH EVERY DAY 90 tablet 3  . triamcinolone cream (KENALOG) 0.1 % Apply 1 application topically 2 (two) times daily as needed. (Patient not taking: Reported on 11/14/2017) 30 g 0  . valACYclovir (VALTREX) 1000 MG tablet Take 2 tablets (2,000 mg total) by mouth 2 (two) times daily. X 1 day (Patient not taking: Reported on 11/14/2017) 8 tablet 3   No current facility-administered medications on file prior to visit.    Allergies  Allergen Reactions  . Amlodipine Swelling    swelling in ankles  . Shellfish Allergy     N&V AFTER EATING MUSSELS   Social History   Socioeconomic History  . Marital status: Single    Spouse name: Not on file  . Number of children: Not  on file  . Years of education: Not on file  . Highest education level: Not on file  Occupational History  . Not on file  Social Needs  . Financial resource strain: Not on file  . Food insecurity:    Worry: Not on file    Inability: Not on file  . Transportation needs:    Medical: Not on file    Non-medical: Not on file  Tobacco Use  . Smoking status: Former Smoker    Years: 10.00    Last attempt to quit: 02/22/1992    Years since quitting: 25.7  . Smokeless tobacco: Never Used  . Tobacco comment: QUIT SMOKING ABOUT 1994 - 1 PP WEEK  Substance and Sexual Activity  . Alcohol use: Yes    Comment: 2 GLASSES WINE DAILY  . Drug use: No  . Sexual activity: Not on  file  Lifestyle  . Physical activity:    Days per week: Not on file    Minutes per session: Not on file  . Stress: Not on file  Relationships  . Social connections:    Talks on phone: Not on file    Gets together: Not on file    Attends religious service: Not on file    Active member of club or organization: Not on file    Attends meetings of clubs or organizations: Not on file    Relationship status: Not on file  . Intimate partner violence:    Fear of current or ex partner: Not on file    Emotionally abused: Not on file    Physically abused: Not on file    Forced sexual activity: Not on file  Other Topics Concern  . Not on file  Social History Narrative  . Not on file      Review of Systems  All other systems reviewed and are negative.      Objective:   Physical Exam  Constitutional: He is oriented to person, place, and time. He appears well-developed and well-nourished. No distress.  HENT:  Head: Normocephalic and atraumatic.  Right Ear: External ear normal.  Left Ear: External ear normal.  Nose: Nose normal.  Mouth/Throat: Oropharynx is clear and moist. No oropharyngeal exudate.  Eyes: Pupils are equal, round, and reactive to light. Conjunctivae and EOM are normal. Right eye exhibits no discharge. Left eye exhibits no discharge. No scleral icterus.  Neck: Normal range of motion. Neck supple. No JVD present. No tracheal deviation present. No thyromegaly present.  Cardiovascular: Normal rate, regular rhythm, normal heart sounds and intact distal pulses. Exam reveals no gallop and no friction rub.  No murmur heard. Pulmonary/Chest: Effort normal and breath sounds normal. No stridor. No respiratory distress. He has no wheezes. He has no rales. He exhibits no tenderness.  Abdominal: Soft. Bowel sounds are normal. He exhibits no distension and no mass. There is no tenderness. There is no rebound and no guarding.  Musculoskeletal: Normal range of motion. He exhibits no  edema, tenderness or deformity.  Lymphadenopathy:    He has no cervical adenopathy.  Neurological: He is alert and oriented to person, place, and time. He has normal reflexes. No cranial nerve deficit. He exhibits normal muscle tone. Coordination normal.  Skin: Skin is warm. No rash noted. He is not diaphoretic. No erythema. No pallor.  Psychiatric: He has a normal mood and affect. His behavior is normal. Judgment and thought content normal.  Vitals reviewed.         Assessment & Plan:  General medical exam  Essential hypertension  Pure hypercholesterolemia  Physical exam today is outstanding.  Immunizations are up-to-date.  I did recommend Shingrix if reasonably priced.  Colonoscopy is up-to-date.  PSA is normal.  Lab work is outstanding.  Blood pressure is excellent.  There is no evidence of memory loss, depression, or balance issues.  Patient is active and exercising every day.  In fact this weekend he was racing sailboats on [address].  I just encouraged him to continue his active lifestyle follow-up in 1 year or as needed. Lab on 11/07/2017  Component Date Value Ref Range Status  . Glucose, Bld 11/07/2017 93  65 - 99 mg/dL Final   Comment: .            Fasting reference interval .   . BUN 11/07/2017 22  7 - 25 mg/dL Final  . Creat 11/07/2017 1.08  0.70 - 1.18 mg/dL Final   Comment: For patients >14 years of age, the reference limit for Creatinine is approximately 13% higher for people identified as African-American. .   Havery Moros Ratio 37/85/8850 NOT APPLICABLE  6 - 22 (calc) Final  . Sodium 11/07/2017 140  135 - 146 mmol/L Final  . Potassium 11/07/2017 4.8  3.5 - 5.3 mmol/L Final  . Chloride 11/07/2017 104  98 - 110 mmol/L Final  . CO2 11/07/2017 28  20 - 32 mmol/L Final  . Calcium 11/07/2017 9.0  8.6 - 10.3 mg/dL Final  . Total Protein 11/07/2017 6.6  6.1 - 8.1 g/dL Final  . Albumin 11/07/2017 4.0  3.6 - 5.1 g/dL Final  . Globulin 11/07/2017 2.6  1.9 - 3.7  g/dL (calc) Final  . AG Ratio 11/07/2017 1.5  1.0 - 2.5 (calc) Final  . Total Bilirubin 11/07/2017 0.8  0.2 - 1.2 mg/dL Final  . Alkaline phosphatase (APISO) 11/07/2017 63  40 - 115 U/L Final  . AST 11/07/2017 17  10 - 35 U/L Final  . ALT 11/07/2017 15  9 - 46 U/L Final  . Cholesterol 11/07/2017 174  <200 mg/dL Final  . HDL 11/07/2017 56  >40 mg/dL Final  . Triglycerides 11/07/2017 116  <150 mg/dL Final  . LDL Cholesterol (Calc) 11/07/2017 97  mg/dL (calc) Final   Comment: Reference range: <100 . Desirable range <100 mg/dL for primary prevention;   <70 mg/dL for patients with CHD or diabetic patients  with > or = 2 CHD risk factors. Marland Kitchen LDL-C is now calculated using the Martin-Hopkins  calculation, which is a validated novel method providing  better accuracy than the Friedewald equation in the  estimation of LDL-C.  Cresenciano Genre et al. Annamaria Helling. 2774;128(78): 2061-2068  (http://education.[employer].com/faq/FAQ164)   . Total CHOL/HDL Ratio 11/07/2017 3.1  <5.0 (calc) Final  . Non-HDL Cholesterol (Calc) 11/07/2017 118  <130 mg/dL (calc) Final   Comment: For patients with diabetes plus 1 major ASCVD risk  factor, treating to a non-HDL-C goal of <100 mg/dL  (LDL-C of <70 mg/dL) is considered a therapeutic  option.   . WBC 11/07/2017 5.0  3.8 - 10.8 Thousand/uL Final  . RBC 11/07/2017 4.44  4.20 - 5.80 Million/uL Final  . Hemoglobin 11/07/2017 14.6  13.2 - 17.1 g/dL Final  . HCT 11/07/2017 42.6  38.5 - 50.0 % Final  . MCV 11/07/2017 95.9  80.0 - 100.0 fL Final  . MCH 11/07/2017 32.9  27.0 - 33.0 pg Final  . MCHC 11/07/2017 34.3  32.0 - 36.0 g/dL Final  . RDW 11/07/2017 11.9  11.0 -  15.0 % Final  . Platelets 11/07/2017 231  140 - 400 Thousand/uL Final  . MPV 11/07/2017 10.4  7.5 - 12.5 fL Final  . Neutro Abs 11/07/2017 2,615  1,500 - 7,800 cells/uL Final  . Lymphs Abs 11/07/2017 1,450  850 - 3,900 cells/uL Final  . WBC mixed population 11/07/2017 595  200 - 950 cells/uL Final  .  Eosinophils Absolute 11/07/2017 240  15 - 500 cells/uL Final  . Basophils Absolute 11/07/2017 100  0 - 200 cells/uL Final  . Neutrophils Relative % 11/07/2017 52.3  % Final  . Total Lymphocyte 11/07/2017 29.0  % Final  . Monocytes Relative 11/07/2017 11.9  % Final  . Eosinophils Relative 11/07/2017 4.8  % Final  . Basophils Relative 11/07/2017 2.0  % Final  . PSA 11/07/2017 0.4  < OR = 4.0 ng/mL Final   Comment: The total PSA value from this assay system is  standardized against the WHO standard. The test  result will be approximately 20% lower when compared  to the equimolar-standardized total PSA (Beckman  Coulter). Comparison of serial PSA results should be  interpreted with this fact in mind. . This test was performed using the Siemens  chemiluminescent method. Values obtained from  different assay methods cannot be used interchangeably. PSA levels, regardless of value, should not be interpreted as absolute evidence of the presence or absence of disease.

## 2018-02-02 ENCOUNTER — Inpatient Hospital Stay (HOSPITAL_COMMUNITY)
Admission: EM | Admit: 2018-02-02 | Discharge: 2018-02-10 | DRG: 233 | Disposition: A | Discharge status: Home / Self Care | Payer: Medicare Other | Attending: Surgery | Admitting: Surgery

## 2018-02-02 ENCOUNTER — Other Ambulatory Visit: Payer: Self-pay | Admitting: *Deleted

## 2018-02-02 ENCOUNTER — Encounter (HOSPITAL_COMMUNITY): Payer: Self-pay | Admitting: Emergency Medicine

## 2018-02-02 ENCOUNTER — Inpatient Hospital Stay (HOSPITAL_COMMUNITY): Payer: Medicare Other

## 2018-02-02 ENCOUNTER — Ambulatory Visit (HOSPITAL_COMMUNITY): Payer: Medicare Other

## 2018-02-02 ENCOUNTER — Emergency Department (HOSPITAL_COMMUNITY): Payer: Medicare Other

## 2018-02-02 ENCOUNTER — Encounter (HOSPITAL_COMMUNITY)
Admission: EM | Disposition: A | Discharge status: Home / Self Care | Payer: Self-pay | Source: Home / Self Care | Attending: Surgery

## 2018-02-02 DIAGNOSIS — Z79899 Other long term (current) drug therapy: Secondary | ICD-10-CM | POA: Diagnosis not present

## 2018-02-02 DIAGNOSIS — I214 Non-ST elevation (NSTEMI) myocardial infarction: Secondary | ICD-10-CM | POA: Diagnosis present

## 2018-02-02 DIAGNOSIS — Z8249 Family history of ischemic heart disease and other diseases of the circulatory system: Secondary | ICD-10-CM | POA: Diagnosis not present

## 2018-02-02 DIAGNOSIS — I4891 Unspecified atrial fibrillation: Secondary | ICD-10-CM | POA: Diagnosis not present

## 2018-02-02 DIAGNOSIS — E785 Hyperlipidemia, unspecified: Secondary | ICD-10-CM

## 2018-02-02 DIAGNOSIS — Q211 Atrial septal defect: Secondary | ICD-10-CM | POA: Diagnosis not present

## 2018-02-02 DIAGNOSIS — I5021 Acute systolic (congestive) heart failure: Secondary | ICD-10-CM | POA: Diagnosis present

## 2018-02-02 DIAGNOSIS — Z888 Allergy status to other drugs, medicaments and biological substances status: Secondary | ICD-10-CM

## 2018-02-02 DIAGNOSIS — N289 Disorder of kidney and ureter, unspecified: Secondary | ICD-10-CM | POA: Diagnosis not present

## 2018-02-02 DIAGNOSIS — Z87891 Personal history of nicotine dependence: Secondary | ICD-10-CM | POA: Diagnosis not present

## 2018-02-02 DIAGNOSIS — I272 Pulmonary hypertension, unspecified: Secondary | ICD-10-CM | POA: Diagnosis present

## 2018-02-02 DIAGNOSIS — I10 Essential (primary) hypertension: Secondary | ICD-10-CM | POA: Diagnosis present

## 2018-02-02 DIAGNOSIS — I252 Old myocardial infarction: Secondary | ICD-10-CM | POA: Diagnosis present

## 2018-02-02 DIAGNOSIS — Z7982 Long term (current) use of aspirin: Secondary | ICD-10-CM | POA: Diagnosis not present

## 2018-02-02 DIAGNOSIS — Z01810 Encounter for preprocedural cardiovascular examination: Secondary | ICD-10-CM | POA: Diagnosis not present

## 2018-02-02 DIAGNOSIS — K219 Gastro-esophageal reflux disease without esophagitis: Secondary | ICD-10-CM | POA: Diagnosis present

## 2018-02-02 DIAGNOSIS — I2510 Atherosclerotic heart disease of native coronary artery without angina pectoris: Secondary | ICD-10-CM

## 2018-02-02 DIAGNOSIS — I110 Hypertensive heart disease with heart failure: Secondary | ICD-10-CM | POA: Diagnosis present

## 2018-02-02 DIAGNOSIS — Z951 Presence of aortocoronary bypass graft: Secondary | ICD-10-CM

## 2018-02-02 DIAGNOSIS — I25110 Atherosclerotic heart disease of native coronary artery with unstable angina pectoris: Secondary | ICD-10-CM

## 2018-02-02 HISTORY — PX: LEFT HEART CATH AND CORONARY ANGIOGRAPHY: CATH118249

## 2018-02-02 LAB — HEMOGLOBIN A1C
Hgb A1c MFr Bld: 5.5 % (ref 4.8–5.6)
Mean Plasma Glucose: 111.15 mg/dL

## 2018-02-02 LAB — ECHOCARDIOGRAM COMPLETE
Height: 67 in
Weight: 2880 oz

## 2018-02-02 LAB — CBC
HEMATOCRIT: 46.3 % (ref 39.0–52.0)
Hemoglobin: 15.2 g/dL (ref 13.0–17.0)
MCH: 32.3 pg (ref 26.0–34.0)
MCHC: 32.8 g/dL (ref 30.0–36.0)
MCV: 98.5 fL (ref 80.0–100.0)
Platelets: 231 10*3/uL (ref 150–400)
RBC: 4.7 MIL/uL (ref 4.22–5.81)
RDW: 14.6 % (ref 11.5–15.5)
WBC: 6.9 10*3/uL (ref 4.0–10.5)
nRBC: 0 % (ref 0.0–0.2)

## 2018-02-02 LAB — PULMONARY FUNCTION TEST
FEF 25-75 Post: 1.45 L/sec
FEF 25-75 Pre: 1.34 L/sec
FEF2575-%Change-Post: 8 %
FEF2575-%Pred-Post: 78 %
FEF2575-%Pred-Pre: 72 %
FEV1-%Change-Post: 1 %
FEV1-%PRED-PRE: 79 %
FEV1-%Pred-Post: 80 %
FEV1-Post: 2.11 L
FEV1-Pre: 2.09 L
FEV1FVC-%Change-Post: 12 %
FEV1FVC-%Pred-Pre: 101 %
FEV6-%Change-Post: -3 %
FEV6-%Pred-Post: 74 %
FEV6-%Pred-Pre: 77 %
FEV6-POST: 2.55 L
FEV6-Pre: 2.64 L
FEV6FVC-%Change-Post: 7 %
FEV6FVC-%Pred-Post: 106 %
FEV6FVC-%Pred-Pre: 99 %
FVC-%Change-Post: -9 %
FVC-%Pred-Post: 70 %
FVC-%Pred-Pre: 78 %
FVC-Post: 2.58 L
FVC-Pre: 2.86 L
PRE FEV1/FVC RATIO: 73 %
Post FEV1/FVC ratio: 82 %
Post FEV6/FVC ratio: 99 %
Pre FEV6/FVC Ratio: 92 %

## 2018-02-02 LAB — BASIC METABOLIC PANEL
Anion gap: 9 (ref 5–15)
BUN: 19 mg/dL (ref 8–23)
CHLORIDE: 105 mmol/L (ref 98–111)
CO2: 27 mmol/L (ref 22–32)
Calcium: 9 mg/dL (ref 8.9–10.3)
Creatinine, Ser: 1.13 mg/dL (ref 0.61–1.24)
GFR calc Af Amer: >60 mL/min (ref >60)
GFR calc non Af Amer: >60 mL/min (ref >60)
Glucose, Bld: 112 mg/dL — ABNORMAL HIGH (ref 70–99)
Potassium: 3.9 mmol/L (ref 3.5–5.1)
Sodium: 141 mmol/L (ref 135–145)

## 2018-02-02 LAB — I-STAT TROPONIN, ED
Troponin i, poc: 0.04 ng/mL (ref 0.00–0.08)
Troponin i, poc: 2.28 ng/mL (ref 0.00–0.08)

## 2018-02-02 LAB — TROPONIN I
Troponin I: 4.38 ng/mL (ref <0.03)
Troponin I: 3.15 ng/mL (ref <0.03)

## 2018-02-02 SURGERY — LEFT HEART CATH AND CORONARY ANGIOGRAPHY
Anesthesia: LOCAL

## 2018-02-02 MED ORDER — VERAPAMIL HCL 2.5 MG/ML IV SOLN
INTRAVENOUS | Status: AC
  Filled 2018-02-02: qty 2

## 2018-02-02 MED ORDER — ACETAMINOPHEN 325 MG PO TABS: 650.0000 mg | ORAL_TABLET | ORAL | Status: DC | PRN

## 2018-02-02 MED ORDER — METOPROLOL TARTRATE 12.5 MG HALF TABLET
12.5000 mg | ORAL_TABLET | Freq: Two times a day (BID) | ORAL | Status: DC
  Administered 2018-02-02 – 2018-02-04 (×5): 12.5 mg via ORAL
  Filled 2018-02-02 (×5): qty 1

## 2018-02-02 MED ORDER — SODIUM CHLORIDE 0.9% FLUSH: 3.0000 mL | Freq: Once | INTRAVENOUS | Status: DC

## 2018-02-02 MED ORDER — VERAPAMIL HCL 2.5 MG/ML IV SOLN
INTRAVENOUS | Status: DC | PRN
  Administered 2018-02-02: 10 mL via INTRA_ARTERIAL

## 2018-02-02 MED ORDER — LIDOCAINE HCL (PF) 1 % IJ SOLN
INTRAMUSCULAR | Status: AC
  Filled 2018-02-02: qty 30

## 2018-02-02 MED ORDER — HEPARIN (PORCINE) 25000 UT/250ML-% IV SOLN
1150.0000 [IU]/h | INTRAVENOUS | Status: DC
  Administered 2018-02-02: 1000 [IU]/h via INTRAVENOUS
  Administered 2018-02-03 – 2018-02-04 (×2): 1150 [IU]/h via INTRAVENOUS
  Filled 2018-02-02 (×2): qty 250

## 2018-02-02 MED ORDER — NITROGLYCERIN IN D5W 200-5 MCG/ML-% IV SOLN: 0.0000 ug/min | INTRAVENOUS | Status: DC

## 2018-02-02 MED ORDER — SODIUM CHLORIDE 0.9% FLUSH: 3.0000 mL | INTRAVENOUS | Status: DC | PRN

## 2018-02-02 MED ORDER — ATORVASTATIN CALCIUM 80 MG PO TABS
80.0000 mg | ORAL_TABLET | Freq: Every day | ORAL | Status: DC
  Administered 2018-02-02 – 2018-02-09 (×8): 80 mg via ORAL
  Filled 2018-02-02 (×7): qty 1

## 2018-02-02 MED ORDER — NITROGLYCERIN 0.4 MG SL SUBL: 0.4000 mg | SUBLINGUAL_TABLET | SUBLINGUAL | Status: DC | PRN

## 2018-02-02 MED ORDER — FENTANYL CITRATE (PF) 100 MCG/2ML IJ SOLN
INTRAMUSCULAR | Status: AC
  Filled 2018-02-02: qty 2

## 2018-02-02 MED ORDER — NITROGLYCERIN IN D5W 200-5 MCG/ML-% IV SOLN
INTRAVENOUS | Status: AC | PRN
  Administered 2018-02-02: 5 ug/min via INTRAVENOUS

## 2018-02-02 MED ORDER — ONDANSETRON HCL 4 MG/2ML IJ SOLN: 4.0000 mg | Freq: Four times a day (QID) | INTRAMUSCULAR | Status: DC | PRN

## 2018-02-02 MED ORDER — SODIUM CHLORIDE 0.9 % IV SOLN: INTRAVENOUS | Status: DC

## 2018-02-02 MED ORDER — HEPARIN BOLUS VIA INFUSION
4000.0000 [IU] | Freq: Once | INTRAVENOUS | Status: AC
  Administered 2018-02-02: 4000 [IU] via INTRAVENOUS
  Filled 2018-02-02: qty 4000

## 2018-02-02 MED ORDER — FUROSEMIDE 40 MG PO TABS
40.0000 mg | ORAL_TABLET | Freq: Two times a day (BID) | ORAL | Status: DC
  Administered 2018-02-02 – 2018-02-04 (×5): 40 mg via ORAL
  Filled 2018-02-02 (×5): qty 1

## 2018-02-02 MED ORDER — NITROGLYCERIN IN D5W 200-5 MCG/ML-% IV SOLN
INTRAVENOUS | Status: AC
  Filled 2018-02-02: qty 250

## 2018-02-02 MED ORDER — IOHEXOL 350 MG/ML SOLN
INTRAVENOUS | Status: DC | PRN
  Administered 2018-02-02: 130 mL via INTRACARDIAC

## 2018-02-02 MED ORDER — HEPARIN (PORCINE) IN NACL 1000-0.9 UT/500ML-% IV SOLN
INTRAVENOUS | Status: AC
  Filled 2018-02-02: qty 1000

## 2018-02-02 MED ORDER — MIDAZOLAM HCL 2 MG/2ML IJ SOLN
INTRAMUSCULAR | Status: AC
  Filled 2018-02-02: qty 2

## 2018-02-02 MED ORDER — SODIUM CHLORIDE 0.9 % IV SOLN: 250.0000 mL | INTRAVENOUS | Status: DC | PRN

## 2018-02-02 MED ORDER — MIDAZOLAM HCL 2 MG/2ML IJ SOLN
INTRAMUSCULAR | Status: DC | PRN
  Administered 2018-02-02: 1 mg via INTRAVENOUS

## 2018-02-02 MED ORDER — SODIUM CHLORIDE 0.9% FLUSH: 3.0000 mL | Freq: Two times a day (BID) | INTRAVENOUS | Status: DC

## 2018-02-02 MED ORDER — LIDOCAINE HCL (PF) 1 % IJ SOLN
INTRAMUSCULAR | Status: DC | PRN
  Administered 2018-02-02: 2 mL

## 2018-02-02 MED ORDER — HEPARIN (PORCINE) IN NACL 1000-0.9 UT/500ML-% IV SOLN
INTRAVENOUS | Status: DC | PRN
  Administered 2018-02-02 (×2): 500 mL

## 2018-02-02 MED ORDER — ASPIRIN 81 MG PO CHEW: 81.0000 mg | CHEWABLE_TABLET | ORAL | Status: DC

## 2018-02-02 MED ORDER — ASPIRIN 81 MG PO CHEW
162.0000 mg | CHEWABLE_TABLET | Freq: Once | ORAL | Status: AC
  Administered 2018-02-02: 162 mg via ORAL
  Filled 2018-02-02: qty 2

## 2018-02-02 MED ORDER — HEPARIN SODIUM (PORCINE) 1000 UNIT/ML IJ SOLN
INTRAMUSCULAR | Status: DC | PRN
  Administered 2018-02-02: 4500 [IU] via INTRAVENOUS

## 2018-02-02 MED ORDER — HEPARIN (PORCINE) 25000 UT/250ML-% IV SOLN
1000.0000 [IU]/h | INTRAVENOUS | Status: DC
  Administered 2018-02-02: 1000 [IU]/h via INTRAVENOUS
  Filled 2018-02-02: qty 250

## 2018-02-02 MED ORDER — FENTANYL CITRATE (PF) 100 MCG/2ML IJ SOLN
INTRAMUSCULAR | Status: DC | PRN
  Administered 2018-02-02: 25 ug via INTRAVENOUS

## 2018-02-02 MED ORDER — PERFLUTREN LIPID MICROSPHERE
INTRAVENOUS | Status: AC
  Filled 2018-02-02: qty 10

## 2018-02-02 MED ORDER — PANTOPRAZOLE SODIUM 40 MG PO TBEC
40.0000 mg | DELAYED_RELEASE_TABLET | Freq: Every day | ORAL | Status: DC
  Administered 2018-02-03 – 2018-02-04 (×2): 40 mg via ORAL
  Filled 2018-02-02 (×2): qty 1

## 2018-02-02 MED ORDER — ASPIRIN EC 81 MG PO TBEC
81.0000 mg | DELAYED_RELEASE_TABLET | Freq: Every day | ORAL | Status: DC
  Administered 2018-02-03 – 2018-02-04 (×2): 81 mg via ORAL
  Filled 2018-02-02 (×2): qty 1

## 2018-02-02 MED ORDER — ALBUTEROL SULFATE (2.5 MG/3ML) 0.083% IN NEBU
2.5000 mg | INHALATION_SOLUTION | Freq: Once | RESPIRATORY_TRACT | Status: AC
  Administered 2018-02-02: 2.5 mg via RESPIRATORY_TRACT

## 2018-02-02 SURGICAL SUPPLY — 12 items
CATH 5FR JL3.5 JR4 ANG PIG MP (CATHETERS) ×1 IMPLANT
CATH LAUNCHER 5F RADR (CATHETERS) IMPLANT
CATHETER LAUNCHER 5F RADR (CATHETERS) ×2
DEVICE RAD COMP TR BAND LRG (VASCULAR PRODUCTS) ×1 IMPLANT
GLIDESHEATH SLEND SS 6F .021 (SHEATH) ×1 IMPLANT
GUIDEWIRE INQWIRE 1.5J.035X260 (WIRE) IMPLANT
INQWIRE 1.5J .035X260CM (WIRE) ×2
KIT HEART LEFT (KITS) ×2 IMPLANT
PACK CARDIAC CATHETERIZATION (CUSTOM PROCEDURE TRAY) ×2 IMPLANT
SYR MEDRAD MARK 7 150ML (SYRINGE) ×2 IMPLANT
TRANSDUCER W/STOPCOCK (MISCELLANEOUS) ×2 IMPLANT
TUBING CIL FLEX 10 FLL-RA (TUBING) ×2 IMPLANT

## 2018-02-02 NOTE — ED Provider Notes (Signed)
[facility] EMERGENCY DEPARTMENT Provider Note   CSN: [id number] Arrival date & time: 02/02/18  0324     History   Chief Complaint Chief Complaint  Patient presents with  . Chest Pain    HPI Alex Martin is a 78 y.o. male.  78 yo M with a chief complaint of chest pain.  This woke him out of sleep this morning about 3 AM.  Patient describes it as a severe discomfort across the anterior portion of the chest that radiated to both arms and to his back.  Had some significant shortness of breath and nausea with it.  He took 2 baby aspirin soon she became concerned and came to the ED.  He states the pain resolved in about an hour.  Now is currently feeling much better.  Had an episode have been like this to him a couple weeks ago.  He denies exertional chest pain or shortness of breath.  Denies cough congestion or fever.  Denies history of MI.  Has a history of hypertension hyperlipidemia.  Denies diabetes or smoking.  Father had an MI in his 29s.  Otherwise no family history.  He denies history of PE or DVT.  Denies recent surgery or immobilization.  Denies hemoptysis denies lower extremity edema.  Denies history of cancer.  The history is provided by the patient.  Chest Pain  Pain location:  Substernal area Pain quality: aching   Pain radiates to:  L shoulder and R shoulder Pain severity:  Severe Onset quality:  Sudden Duration:  1 hour Timing:  Constant Progression:  Resolved Chronicity:  Recurrent Context: at rest   Relieved by:  Nothing Worsened by:  Nothing Ineffective treatments:  None tried Associated symptoms: nausea and shortness of breath   Associated symptoms: no abdominal pain, no fever, no headache, no palpitations and no vomiting     Past Medical History:  Diagnosis Date  . Abdominal distension    UMBILICAL HERNIA-NO PAIN  . Bleeding from the nose    SEVERAL EPISODES THAT REQUIRED TX IN ER--LAST EPISODE WAS APPROX 1 YR AGO  . Blood in urine     MICROSCOPIC BLOODEVALUATED BY DR. NESI FRIDAY 02/11/11--URINE SAMPLE AND EXAM-- AND BLOOD SAMPLE AND TO BE SEEN AGAIN IN 6 MONTHS AND GIVEN VESICARE  FOR FREQUENCY AND URGENCY  . GERD (gastroesophageal reflux disease)   . Hypertension     Patient Active Problem List   Diagnosis Date Noted  . Bilateral sensorineural hearing loss 11/10/2015  . Essential hypertension 05/09/2014  . GERD (gastroesophageal reflux disease) 05/09/2014    Past Surgical History:  Procedure Laterality Date  . HERNIA REPAIR  1959   [facility]  . UMBILICAL HERNIA REPAIR  02/15/2011   Procedure: HERNIA REPAIR UMBILICAL ADULT;  Surgeon: Odis Hollingshead, MD;  Location: [facility];  Service: General;  Laterality: N/A;  umbilical hernia repair with mesh  . VASECTOMY  1976        Home Medications    Prior to Admission medications   Medication Sig Start Date End Date Taking? Authorizing Provider  aspirin EC 81 MG tablet Take 81 mg by mouth daily.    [provider]  atorvastatin (LIPITOR) 20 MG tablet TAKE 1 TABLET (20 MG TOTAL) BY MOUTH DAILY. 06/28/17   Susy Frizzle, MD  Cyanocobalamin (VITAMIN B-12 PO) Take 1 tablet by mouth daily.    [provider]  latanoprost (XALATAN) 0.005 % ophthalmic solution 1 DROP IN BOTH EYES AT BEDTIME 10/17/14  [provider]  losartan-hydrochlorothiazide (HYZAAR) 100-12.5 MG tablet TAKE 1 TABLET BY MOUTH EVERY DAY 01/23/17   Susy Frizzle, MD  Multiple Vitamin (MULTIVITAMIN) tablet Take 1 tablet by mouth daily.    [provider]  NEXIUM 40 MG capsule TAKE 1 CAPSULE (40 MG TOTAL) BY MOUTH DAILY. 04/24/17   Susy Frizzle, MD  triamcinolone cream (KENALOG) 0.1 % Apply 1 application topically 2 (two) times daily as needed. Patient not taking: Reported on 11/14/2017 04/11/17   Delsa Grana, PA-C  valACYclovir (VALTREX) 1000 MG tablet Take 2 tablets (2,000 mg total) by mouth 2 (two) times daily. X 1 day Patient not taking: Reported on 11/14/2017  07/03/14   Susy Frizzle, MD  VESICARE 5 MG tablet TAKE 1 TABLET BY MOUTH EVERY DAY 08/16/17   Susy Frizzle, MD    Family History Family History  Problem Relation Age of Onset  . Heart disease Mother   . Heart disease Father   . Cancer Sister        pancreatic    Social History Social History   Tobacco Use  . Smoking status: Former Smoker    Years: 10.00    Last attempt to quit: 02/22/1992    Years since quitting: 25.9  . Smokeless tobacco: Never Used  . Tobacco comment: QUIT SMOKING ABOUT 1994 - 1 PP WEEK  Substance Use Topics  . Alcohol use: Yes    Comment: 2 GLASSES WINE DAILY  . Drug use: No     Allergies   Amlodipine and Shellfish allergy   Review of Systems Review of Systems  Constitutional: Negative for chills and fever.  HENT: Negative for congestion and facial swelling.   Eyes: Negative for discharge and visual disturbance.  Respiratory: Positive for shortness of breath.   Cardiovascular: Positive for chest pain. Negative for palpitations.  Gastrointestinal: Positive for nausea. Negative for abdominal pain, diarrhea and vomiting.  Musculoskeletal: Negative for arthralgias and myalgias.  Skin: Negative for color change and rash.  Neurological: Negative for tremors, syncope and headaches.  Psychiatric/Behavioral: Negative for confusion and dysphoric mood.     Physical Exam Updated Vital Signs BP (!) 141/88   Pulse 78   Temp 98.1 F (36.7 C) (Oral)   Resp 16   Ht 5\' 7"  (1.702 m)   Wt 81.6 kg   SpO2 97%   BMI 28.19 kg/m   Physical Exam Vitals signs and nursing note reviewed.  Constitutional:      Appearance: He is well-developed.  HENT:     Head: Normocephalic and atraumatic.  Eyes:     Pupils: Pupils are equal, round, and reactive to light.  Neck:     Musculoskeletal: Normal range of motion and neck supple.     Vascular: No JVD.  Cardiovascular:     Rate and Rhythm: Normal rate and regular rhythm.     Heart sounds: No murmur. No  friction rub. No gallop.   Pulmonary:     Effort: No respiratory distress.     Breath sounds: No wheezing.  Abdominal:     General: There is no distension.     Tenderness: There is no guarding or rebound.  Musculoskeletal: Normal range of motion.  Skin:    Coloration: Skin is not pale.     Findings: No rash.  Neurological:     Mental Status: He is alert and oriented to person, place, and time.  Psychiatric:        Behavior: Behavior normal.  ED Treatments / Results  Labs (all labs ordered are listed, but only abnormal results are displayed) Labs Reviewed  BASIC METABOLIC PANEL - Abnormal; Notable for the following components:      Result Value   Glucose, Bld 112 (*)    All other components within normal limits  I-STAT TROPONIN, ED - Abnormal; Notable for the following components:   Troponin i, poc 2.28 (*)    All other components within normal limits  CBC  I-STAT TROPONIN, ED    EKG EKG Interpretation  Date/Time:  Friday February 02 2018 03:31:21 EST Ventricular Rate:  97 PR Interval:  200 QRS Duration: 94 QT Interval:  348 QTC Calculation: 441 R Axis:   26 Text Interpretation:  Normal sinus rhythm Septal infarct , age undetermined Abnormal ECG Nonspecific ST abnormality When compared with ECG of 02/10/2011, Nonspecific ST abnormality is now present Confirmed by Delora Fuel ([id number]) on 02/02/2018 3:43:07 AM Also confirmed by Delora Fuel ([id number]), editor Philomena Doheny [phone])  on 02/02/2018 8:07:57 AM   Radiology Dg Chest 2 View  Result Date: 02/02/2018 CLINICAL DATA:  Awoke in with left-sided chest pain EXAM: CHEST - 2 VIEW COMPARISON:  10/10/2013 chest CT FINDINGS: Normal heart size and mediastinal contours. Artifact from EKG leads. No acute infiltrate or edema. No effusion or pneumothorax. No acute osseous findings. IMPRESSION: No active cardiopulmonary disease. Electronically Signed   By: Monte Fantasia M.D.   On: 02/02/2018 04:25    Procedures Procedures  (including critical care time)  Medications Ordered in ED Medications  sodium chloride flush (NS) 0.9 % injection 3 mL (has no administration in time range)  aspirin chewable tablet 162 mg (162 mg Oral Given 02/02/18 0816)     Initial Impression / Assessment and Plan / ED Course  I have reviewed the triage vital signs and the nursing notes.  Pertinent labs & imaging results that were available during my care of the patient were reviewed by me and considered in my medical decision making (see chart for details).     78 yo M with a chief complaint of chest pain.  This woke him up from sleep.  Sounded fairly severe with radiation to bilateral shoulders and shortness of breath.  Currently is pain-free.  Initial troponin was negative.  His EKG does show diffuse ST depressions that were not seen on prior.  He is not had an EKG probably than 5 years in the system.  Will give aspirin, discuss with the cardiologist.  I discussed the case with Dr. Marlou Porch, cardiology based on his EKG he felt the patient likely need to come to the hospital.  With the initial negative troponin he recommended hospitalist admission.  Second troponin returned and was significantly positive at 2.28.  We will start the patient on heparin.  Cardiology midlevel Ellen Henri was down to evaluate the patient and she would notify Dr. Marlou Porch.  Likely cardiology admission.  Patient continues to be pain-free.  CRITICAL CARE Performed by: Cecilio Asper   Total critical care time: 35 minutes  Critical care time was exclusive of separately billable procedures and treating other patients.  Critical care was necessary to treat or prevent imminent or life-threatening deterioration.  Critical care was time spent personally by me on the following activities: development of treatment plan with patient and/or surrogate as well as nursing, discussions with consultants, evaluation of patient's response to treatment, examination of  patient, obtaining history from patient or surrogate, ordering and performing treatments and interventions, ordering and  review of laboratory studies, ordering and review of radiographic studies, pulse oximetry and re-evaluation of patient's condition.  The patients results and plan were reviewed and discussed.   Any x-rays performed were independently reviewed by myself.   Differential diagnosis were considered with the presenting HPI.  Medications  sodium chloride flush (NS) 0.9 % injection 3 mL (has no administration in time range)  aspirin chewable tablet 162 mg (162 mg Oral Given 02/02/18 0816)    Vitals:   02/02/18 0337 02/02/18 0338 02/02/18 0806  BP: (!) 172/91  (!) 141/88  Pulse: 98  78  Resp: 16  16  Temp: 98.1 F (36.7 C)    TempSrc: Oral    SpO2: 95%  97%  Weight:  81.6 kg   Height:  5\' 7"  (1.702 m)     Final diagnoses:  NSTEMI (non-ST elevated myocardial infarction) [facility])    Admission/ observation were discussed with the admitting physician, patient and/or family and they are comfortable with the plan.   Final Clinical Impressions(s) / ED Diagnoses   Final diagnoses:  NSTEMI (non-ST elevated myocardial infarction) [facility])    ED Discharge Orders    None       Deno Etienne, DO 02/02/18 [phone]

## 2018-02-02 NOTE — Progress Notes (Signed)
  CRITICAL VALUE ALERT  Critical Value:Troponin 4.38.  Date & Time Notied: 02/02/18; 2583  Provider Notified: NA  Orders Received/Actions taken: pt is chest pain free Pt on Nitro gtt, Hep gtt to be restarted, pt is being prepped for CABG. Continue to monitor pt closely

## 2018-02-02 NOTE — Interval H&P Note (Signed)
History and Physical Interval Note:  02/02/2018 9:32 AM  Georgie Chard  has presented today for surgery, with the diagnosis of nstemi  The various methods of treatment have been discussed with the patient and family. After consideration of risks, benefits and other options for treatment, the patient has consented to  Procedure(s): LEFT HEART CATH AND CORONARY ANGIOGRAPHY (N/A) as a surgical intervention .  The patient's history has been reviewed, patient examined, no change in status, stable for surgery.  I have reviewed the patient's chart and labs.  Questions were answered to the patient's satisfaction.    Cath Lab Visit (complete for each Cath Lab visit)  Clinical Evaluation Leading to the Procedure:   ACS: Yes.    Non-ACS:    Anginal Classification: CCS IV  Anti-ischemic medical therapy: No Therapy  Non-Invasive Test Results: No non-invasive testing performed  Prior CABG: No previous CABG       Christphor Groft [place] MD,FACC 02/02/2018 9:32 AM

## 2018-02-02 NOTE — H&P (Addendum)
.  Cardiology Admission History and Physical:   Patient ID: Alex Martin MRN: [id number]; DOB: July 16, 1940   Admission date: 02/02/2018  Primary Care Provider: No primary care provider on file. Primary Cardiologist: Candee Furbish, MD  Primary Electrophysiologist:  None   Chief Complaint: New onset substernal chest pain with radiation to the back and bilateral arms  Patient Profile:   Alex Martin is a 78 y.o. male with hypertension and hyperlipidemia but no prior cardiac history presenting to the ED with new onset chest pain and has ruled in for non-STEMI.  History of Present Illness:   Alex Martin has no prior cardiac history.  Cardiac risk factors include hypertension and hyperlipidemia, on medications.  He reports a family history of coronary artery disease.  His father had a myocardial infarction however later in life, in his late 70s to 53s.  Patient also reports a remote history of tobacco use.  No personal history of diabetes.  He stays pretty active.  He is enrolled in [employer] but has not experienced any recent exertional chest pain or dyspnea.   He was in his usual state of health until early this morning around 2:30 AM when he woke up from his sleep with substernal chest pressure radiating to his back and bilateral arms.  The pain was intense and he has never felt symptoms like this before.  There was no associated dyspnea, diaphoresis, nausea or vomiting.  He took some aspirin and woke up his wife to drive him to the ED.  Patient reports that his chest pain lasted for approximately 1 hour before resolving and notes that the aspirin helped relieve his symptoms.  On arrival to the ED, he was chest pain-free.  EKG showed new ST depressions in V5 and V6, not previously seen on EKG in 2015.  Initial troponin was negative however repeat second point-of-care troponin is abnormal at 2.28.  He is currently chest pain-free.  Vital signs are stable.  Blood pressure is 141/88  currently.  Heart rates in the 70s.  CBC and basic metabolic panel are unremarkable.  Creatinine is 1.13.  BUN 19.  Hemoglobin is normal at 15.2.   Past Medical History:  Diagnosis Date  . Abdominal distension    UMBILICAL HERNIA-NO PAIN  . Bleeding from the nose    SEVERAL EPISODES THAT REQUIRED TX IN ER--LAST EPISODE WAS APPROX 1 YR AGO  . Blood in urine    MICROSCOPIC BLOODEVALUATED BY DR. NESI FRIDAY 02/11/11--URINE SAMPLE AND EXAM-- AND BLOOD SAMPLE AND TO BE SEEN AGAIN IN 6 MONTHS AND GIVEN VESICARE  FOR FREQUENCY AND URGENCY  . GERD (gastroesophageal reflux disease)   . Hypertension     Past Surgical History:  Procedure Laterality Date  . HERNIA REPAIR  1959   [facility]  . UMBILICAL HERNIA REPAIR  02/15/2011   Procedure: HERNIA REPAIR UMBILICAL ADULT;  Surgeon: Odis Hollingshead, MD;  Location: [facility];  Service: General;  Laterality: N/A;  umbilical hernia repair with mesh  . VASECTOMY  1976     Medications Prior to Admission: Prior to Admission medications   Medication Sig Start Date End Date Taking? Authorizing Provider  aspirin EC 81 MG tablet Take 81 mg by mouth daily.    [provider]  atorvastatin (LIPITOR) 20 MG tablet TAKE 1 TABLET (20 MG TOTAL) BY MOUTH DAILY. 06/28/17   Susy Frizzle, MD  Cyanocobalamin (VITAMIN B-12 PO) Take 1 tablet by mouth daily.    [provider]  latanoprost (XALATAN) 0.005 % ophthalmic solution 1 DROP IN BOTH EYES AT BEDTIME 10/17/14   [provider]  losartan-hydrochlorothiazide (HYZAAR) 100-12.5 MG tablet TAKE 1 TABLET BY MOUTH EVERY DAY 01/23/17   Susy Frizzle, MD  Multiple Vitamin (MULTIVITAMIN) tablet Take 1 tablet by mouth daily.    [provider]  NEXIUM 40 MG capsule TAKE 1 CAPSULE (40 MG TOTAL) BY MOUTH DAILY. 04/24/17   Susy Frizzle, MD  triamcinolone cream (KENALOG) 0.1 % Apply 1 application topically 2 (two) times daily as needed. Patient not taking: Reported on 11/14/2017 04/11/17   Delsa Grana, PA-C  valACYclovir (VALTREX) 1000 MG tablet Take 2 tablets (2,000 mg total) by mouth 2 (two) times daily. X 1 day Patient not taking: Reported on 11/14/2017 07/03/14   Susy Frizzle, MD  VESICARE 5 MG tablet TAKE 1 TABLET BY MOUTH EVERY DAY 08/16/17   Susy Frizzle, MD     Allergies:    Allergies  Allergen Reactions  . Amlodipine Swelling    swelling in ankles  . Shellfish Allergy     N&V AFTER EATING MUSSELS    Social History:   Social History   Socioeconomic History  . Marital status: Single    Spouse name: Not on file  . Number of children: Not on file  . Years of education: Not on file  . Highest education level: Not on file  Occupational History  . Not on file  Social Needs  . Financial resource strain: Not on file  . Food insecurity:    Worry: Not on file    Inability: Not on file  . Transportation needs:    Medical: Not on file    Non-medical: Not on file  Tobacco Use  . Smoking status: Former Smoker    Years: 10.00    Last attempt to quit: 02/22/1992    Years since quitting: 25.9  . Smokeless tobacco: Never Used  . Tobacco comment: QUIT SMOKING ABOUT 1994 - 1 PP WEEK  Substance and Sexual Activity  . Alcohol use: Yes    Comment: 2 GLASSES WINE DAILY  . Drug use: No  . Sexual activity: Not on file  Lifestyle  . Physical activity:    Days per week: Not on file    Minutes per session: Not on file  . Stress: Not on file  Relationships  . Social connections:    Talks on phone: Not on file    Gets together: Not on file    Attends religious service: Not on file    Active member of club or organization: Not on file    Attends meetings of clubs or organizations: Not on file    Relationship status: Not on file  . Intimate partner violence:    Fear of current or ex partner: Not on file    Emotionally abused: Not on file    Physically abused: Not on file    Forced sexual activity: Not on file  Other Topics Concern  . Not on file  Social History  Narrative  . Not on file    Family History:   The patient's family history includes Cancer in his sister; Heart disease in his father and mother.    ROS:  Please see the history of present illness.  All other ROS reviewed and negative.     Physical Exam/Data:   Vitals:   02/02/18 0337 02/02/18 0338 02/02/18 0806  BP: (!) 172/91  (!) 141/88  Pulse: 98  78  Resp: 16  16  Temp: 98.1 F (36.7 C)    TempSrc: Oral    SpO2: 95%  97%  Weight:  81.6 kg   Height:  5\' 7"  (1.702 m)    No intake or output data in the 24 hours ending 02/02/18 0840 Last 3 Weights 02/02/2018 11/14/2017 10/31/2017  Weight (lbs) 180 lb 175 lb 176 lb  Weight (kg) 81.647 kg 79.379 kg 79.833 kg     Body mass index is 28.19 kg/m.  General: Elderly white male, well nourished, well developed, in no acute distress HEENT: normal Lymph: no adenopathy Neck: no JVD Endocrine:  No thryomegaly Vascular: No carotid bruits; FA pulses 2+ bilaterally without bruits  Cardiac:  normal S1, S2; RRR; no murmur  Lungs:  clear to auscultation bilaterally, no wheezing, rhonchi or rales  Abd: soft, nontender, no hepatomegaly  Ext: no edema Musculoskeletal:  No deformities, BUE and BLE strength normal and equal Skin: warm and dry  Neuro:  CNs 2-12 intact, no focal abnormalities noted Psych:  Normal affect    EKG:  The ECG that was done 12/04/2018 was personally reviewed and demonstrates Sinus rhythm, Abnormal T, consider ischemia, lateral leads, Minimal ST elevation, anterior leads  Relevant CV Studies: none  Laboratory Data:  Chemistry Recent Labs  Lab 02/02/18 0342  NA 141  K 3.9  CL 105  CO2 27  GLUCOSE 112*  BUN 19  CREATININE 1.13  CALCIUM 9.0  GFRNONAA >60  GFRAA >60  ANIONGAP 9    No results for input(s): PROT, ALBUMIN, AST, ALT, ALKPHOS, BILITOT in the last 168 hours. Hematology Recent Labs  Lab 02/02/18 0342  WBC 6.9  RBC 4.70  HGB 15.2  HCT 46.3  MCV 98.5  MCH 32.3  MCHC 32.8  RDW 14.6    PLT 231   Cardiac EnzymesNo results for input(s): TROPONINI in the last 168 hours.  Recent Labs  Lab 02/02/18 0341 02/02/18 0810  TROPIPOC 0.04 2.28*    BNPNo results for input(s): BNP, PROBNP in the last 168 hours.  DDimer No results for input(s): DDIMER in the last 168 hours.  Radiology/Studies:  Dg Chest 2 View  Result Date: 02/02/2018 CLINICAL DATA:  Awoke in with left-sided chest pain EXAM: CHEST - 2 VIEW COMPARISON:  10/10/2013 chest CT FINDINGS: Normal heart size and mediastinal contours. Artifact from EKG leads. No acute infiltrate or edema. No effusion or pneumothorax. No acute osseous findings. IMPRESSION: No active cardiopulmonary disease. Electronically Signed   By: Monte Fantasia M.D.   On: 02/02/2018 04:25    Assessment and Plan:   Alex Martin is a 78 y.o. male with hypertension and hyperlipidemia but no prior cardiac history presenting to the ED with new onset chest pain and has ruled in for non-STEMI.  1.  Non-STEMI: Symptomatology, EKG and elevated troponin is consistent with acute coronary syndrome.  Point-of-care troponin is elevated at 2.28.  He is currently chest pain-free.  We will initiate IV heparin and plan for cardiac catheterization today with possible intervention.  Keep n.p.o.  Continue to monitor on telemetry.  His blood pressure is mildly elevated.  We will hold his home losartan hydrochlorothiazide for now given plans for cardiac catheterization and will treat with low-dose beta-blocker, metoprolol 12.5 mg twice daily.  We will also treat with high intensity statin, atorvastatin 80 mg nightly.  Add aspirin 81 mg daily.  Will obtain 2D echocardiogram.  I have reviewed the risks, indications, and alternatives to cardiac catheterization and  possible angioplasty/stenting with the patient. Risks include but are not limited to bleeding, infection, vascular injury, stroke, myocardial infection, arrhythmia, kidney injury, radiation-related injury in the case of  prolonged fluoroscopy use, emergency cardiac surgery, and death. The patient understands the risks of serious complication is low (<0%).    2.  Hypertension: Mildly elevated in the 352 systolic.  We will hold his home losartan- hydrochlorothiazide given plans for cardiac catheterization later today.  We will treat with low-dose beta-blocker, metoprolol 12.5 mg twice daily.  Continue to monitor.   3.  Hyperlipidemia: Patient was previously on atorvastatin 20 mg at home nightly.  Given acute coronary syndrome, we will further increase atorvastatin to 80 mg nightly.  We will check fasting lipid panel.  Goal LDL is less than 70.   For questions or updates, please contact [place] Please consult [email or web address] for contact info under        Signed, Lyda Jester, PA-C  02/02/2018 8:40 AM  Personally seen and examined. Agree with above.   78 year old with no prior coronary artery disease history his father had stents placed in his 38s here with non-ST elevation myocardial infarction.  New onset substernal chest pain occurred during the night with radiation to his back and bilateral arms.  Currently chest pain-free.  First troponin was normal second troponin was elevated, 2.  Non-smoker.  Engineer retired.  He does not have a shellfish allergy, eats shrimp frequently without any problems.  EKG demonstrated ST segment depression, scooping noted in the lateral precordial leads.  Kidney function is normal.  GEN: Well nourished, well developed, in no acute distress  HEENT: normal  Neck: no JVD, carotid bruits, or masses Cardiac: RRR; no murmurs, rubs, or gallops,no edema  Respiratory:  clear to auscultation bilaterally, normal work of breathing GI: soft, nontender, nondistended, + BS MS: no deformity or atrophy  Skin: warm and dry, no rash Neuro:  Alert and Oriented x 3, Strength and sensation are intact Psych: euthymic mood, full affect, nervous  Assessment and plan:  Non-ST  elevation myocardial infarction -Troponin is 2, EKG with ST depression, chest discomfort has resolved.  We will proceed to cardiac catheterization lab.  Risks and benefits have been explained including stroke heart attack death renal impairment bleeding.  He is willing to proceed. - Aspirin, high intensity statin, beta-blocker, assessment of ejection fraction, cardiac rehab when able.  Complex medical decision making.  Candee Furbish, MD

## 2018-02-02 NOTE — Progress Notes (Signed)
[facility] called for CABG evaluation.  °

## 2018-02-02 NOTE — Progress Notes (Signed)
  Echocardiogram 2D Echocardiogram has been performed.  Alex Martin 02/02/2018, 3:55 PM

## 2018-02-02 NOTE — Progress Notes (Signed)
[facility] BAND REMOVAL  LOCATION:    Right radial  DEFLATED PER PROTOCOL:    Yes.    TIME BAND OFF / DRESSING APPLIED:    1340p Gauze and tegaderm with coban   SITE UPON ARRIVAL:    Level 0  SITE AFTER BAND REMOVAL:    Level 0  CIRCULATION SENSATION AND MOVEMENT:    Within Normal Limits   Yes.    COMMENTS:   Pt denies any discomfort at site.

## 2018-02-02 NOTE — ED Triage Notes (Signed)
Pt was woken w/ left sided chest pain that moved to both arms.  Pt also reports nausea, SOB and weak at that time.  No hx of cardiac issues.

## 2018-02-02 NOTE — Progress Notes (Addendum)
Pre CABG evaluation completed. Please see preliminary notes on CV PROC under chart review. Rayna Brenner H Susen Haskew(RDMS RVT)-- Carotid Kelby Aline RDMS RVT RDCS--(ABI) 02/02/18 5:54 PM

## 2018-02-02 NOTE — Consult Note (Signed)
[address]       [place] [address]             [phone]        Seymore L Pagliuca [place] Medical Record #[id number] Date of Birth: Feb 06, 1940  Referring: No ref. provider found Primary Care: No primary care provider on file. Primary Cardiologist:Mark Marlou Porch, MD  Chief Complaint:    Chief Complaint  Patient presents with  . Chest Pain    History of Present Illness:    The patient is a 78 year old male with no prior cardiac history who presented to the ED with substernal chest pain and radiation to the back and both arms.  He has subsequently ruled in for non-STEMI.  He does have cardiac risk factors including hypertension and hyperlipidemia.  Also has a family history of coronary artery disease.  His father had a myocardial infarction in his late 27s to 17s.  He has a remote history of tobacco use.  He quit smoking in 1994.  He has no history of diabetes mellitus.  He stays pretty active and is enrolled in a [employer] program for which she has not had any chest pain or dyspnea. He does report one less intense episode that was around one month ago for which he took aspirin and it resolved. He dis not seek medical evaluation at that time.       He developed chest pain approximately 2:30 AM this morning.  It was substernal and there was radiation to both arms.  He denied any associated dyspnea, diaphoresis, nausea or vomiting.  He woke his wife and they drove to the emergency department.  EKG revealed new ST depressions in leads V4 and V6 which were new compared to a previous EKG done in 2015.  His initial troponin I was negative however the second value was abnormal at 2.28.  He was admitted for further evaluation and treatment.  He was initiated on intravenous heparin and taken to the cardiac catheterization lab where he was found to have severe three-vessel coronary artery disease with some distal left main disease as well.  He is also noted to have  significant LV dysfunction with ejection fraction at 25 to 35% by visual estimate.  Additionally there was moderately elevated LVEDP.  The full report is listed below.  We are asked to see the patient in cardiothoracic surgical consultation for consideration of coronary artery surgical revascularization.  There is no current echocardiogram study available.    Current Activity/ Functional Status: Patient is independent with mobility/ambulation, transfers, ADL's, IADL's.   Zubrod Score: At the time of surgery this patient's most appropriate activity status/level should be described as: [x]     0    Normal activity, no symptoms []     1    Restricted in physical strenuous activity but ambulatory, able to do out light work []     2    Ambulatory and capable of self care, unable to do work activities, up and about                 more than 50%  Of the time                            []     3    Only limited self care, in bed greater than 50% of waking hours []     4    Completely disabled, no self  care, confined to bed or chair []     5    Moribund  Past Medical History:  Diagnosis Date  . Abdominal distension    UMBILICAL HERNIA-NO PAIN  . Bleeding from the nose    SEVERAL EPISODES THAT REQUIRED TX IN ER--LAST EPISODE WAS APPROX 1 YR AGO  . Blood in urine    MICROSCOPIC BLOODEVALUATED BY DR. NESI FRIDAY 02/11/11--URINE SAMPLE AND EXAM-- AND BLOOD SAMPLE AND TO BE SEEN AGAIN IN 6 MONTHS AND GIVEN VESICARE  FOR FREQUENCY AND URGENCY  . GERD (gastroesophageal reflux disease)   . Hypertension     Past Surgical History:  Procedure Laterality Date  . HERNIA REPAIR  1959   [facility]  . UMBILICAL HERNIA REPAIR  02/15/2011   Procedure: HERNIA REPAIR UMBILICAL ADULT;  Surgeon: Odis Hollingshead, MD;  Location: [facility];  Service: General;  Laterality: N/A;  umbilical hernia repair with mesh  . VASECTOMY  1976    Social History   Tobacco Use  Smoking Status Former Smoker  . Years: 10.00  . Last attempt to  quit: 02/22/1992  . Years since quitting: 25.9  Smokeless Tobacco Never Used  Tobacco Comment   QUIT SMOKING ABOUT 1994 - 1 PP WEEK    Social History   Substance and Sexual Activity  Alcohol Use Yes   Comment: 2 GLASSES WINE DAILY     Allergies  Allergen Reactions  . Amlodipine Swelling    swelling in ankles  . Shellfish Allergy     N&V AFTER EATING MUSSELS    Current Facility-Administered Medications  Medication Dose Route Frequency Provider Last Rate Last Dose  . 0.9 %  sodium chloride infusion   Intravenous Continuous Lyda Jester M, PA-C      . [MAR Hold] acetaminophen (TYLENOL) tablet 650 mg  650 mg Oral Q4H PRN Lyda Jester M, PA-C      . [MAR Hold] aspirin EC tablet 81 mg  81 mg Oral Daily Simmons, Brittainy M, PA-C      . [MAR Hold] atorvastatin (LIPITOR) tablet 80 mg  80 mg Oral q1800 Lyda Jester M, PA-C      . heparin ADULT infusion 100 units/mL (25000 units/259mL sodium chloride 0.45%)  1,000 Units/hr Intravenous Continuous Romona Curls, RPH 10 mL/hr at 02/02/18 0840 1,000 Units/hr at 02/02/18 0840  . [MAR Hold] metoprolol tartrate (LOPRESSOR) tablet 12.5 mg  12.5 mg Oral BID Lyda Jester M, PA-C      . [MAR Hold] nitroGLYCERIN (NITROSTAT) SL tablet 0.4 mg  0.4 mg Sublingual Q5 Min x 3 PRN Lyda Jester M, PA-C      . [MAR Hold] pantoprazole (PROTONIX) EC tablet 40 mg  40 mg Oral Daily Simmons, Brittainy M, PA-C      . [MAR Hold] sodium chloride flush (NS) 0.9 % injection 3 mL  3 mL Intravenous Once Deno Etienne, DO        Medications Prior to Admission  Medication Sig Dispense Refill Last Dose  . aspirin EC 81 MG tablet Take 81 mg by mouth daily.   Taking  . atorvastatin (LIPITOR) 20 MG tablet TAKE 1 TABLET (20 MG TOTAL) BY MOUTH DAILY. 90 tablet 3 Taking  . Cyanocobalamin (VITAMIN B-12 PO) Take 1 tablet by mouth daily.   Taking  . latanoprost (XALATAN) 0.005 % ophthalmic solution 1 DROP IN BOTH EYES AT BEDTIME  0 Taking  .  losartan-hydrochlorothiazide (HYZAAR) 100-12.5 MG tablet TAKE 1 TABLET BY MOUTH EVERY DAY 90 tablet 3 Taking  .  Multiple Vitamin (MULTIVITAMIN) tablet Take 1 tablet by mouth daily.   Taking  . NEXIUM 40 MG capsule TAKE 1 CAPSULE (40 MG TOTAL) BY MOUTH DAILY. 90 capsule 3 Taking  . triamcinolone cream (KENALOG) 0.1 % Apply 1 application topically 2 (two) times daily as needed. (Patient not taking: Reported on 11/14/2017) 30 g 0 Not Taking  . valACYclovir (VALTREX) 1000 MG tablet Take 2 tablets (2,000 mg total) by mouth 2 (two) times daily. X 1 day (Patient not taking: Reported on 11/14/2017) 8 tablet 3 Not Taking  . VESICARE 5 MG tablet TAKE 1 TABLET BY MOUTH EVERY DAY 90 tablet 3 Taking    Family History  Problem Relation Age of Onset  . Heart disease Mother   . Heart disease Father   . Cancer Sister        pancreatic     Review of Systems:   Review of Systems  Constitutional: Positive for malaise/fatigue. Negative for chills, diaphoresis, fever and weight loss.  HENT: Positive for congestion, hearing loss, sinus pain and tinnitus. Negative for ear discharge, ear pain, nosebleeds and sore throat.   Eyes: Negative for blurred vision, double vision, photophobia, pain, discharge and redness.  Respiratory: Positive for cough, sputum production, shortness of breath and wheezing. Negative for hemoptysis and stridor.   Cardiovascular: Positive for chest pain. Negative for palpitations, orthopnea, claudication, leg swelling and PND.  Gastrointestinal: Positive for constipation, heartburn, melena and vomiting. Negative for abdominal pain, blood in stool, diarrhea and nausea.  Genitourinary: Positive for flank pain, frequency and urgency. Negative for dysuria and hematuria.       Left flank pain , remote h/o kidney stones  Musculoskeletal: Positive for falls, joint pain and myalgias. Negative for back pain and neck pain.       Right hand arthritis  Skin: Positive for itching and rash.       Dry  skin and itching on legs  Neurological: Positive for dizziness. Negative for tingling, tremors, sensory change, speech change, focal weakness, seizures, loss of consciousness, weakness and headaches.  Endo/Heme/Allergies: Positive for environmental allergies. Negative for polydipsia. Bruises/bleeds easily.  Psychiatric/Behavioral: Positive for memory loss and substance abuse. Negative for depression, hallucinations and suicidal ideas. The patient is not nervous/anxious and does not have insomnia.        1 bottle of wine daily       Physical Exam: BP 128/78   Pulse 73   Temp 98.1 F (36.7 C) (Oral)   Resp 18   Ht 5\' 7"  (1.702 m)   Wt 81.6 kg   SpO2 91%   BMI 28.19 kg/m   Physical Exam  Constitutional: He is oriented to person, place, and time. He appears well-developed and well-nourished. No distress.  HENT:  Head: Normocephalic and atraumatic.  Mouth/Throat: Oropharynx is clear and moist. No oropharyngeal exudate.  Eyes: Pupils are equal, round, and reactive to light. Conjunctivae and EOM are normal. Right eye exhibits no discharge. Left eye exhibits no discharge. No scleral icterus.  Neck: Normal range of motion. Neck supple. No JVD present. No tracheal deviation present. No thyromegaly present.  No carotid bruits  Cardiovascular: Normal rate, regular rhythm and intact distal pulses. Exam reveals no gallop and no friction rub.  No murmur heard. Pulmonary/Chest: Effort normal and breath sounds normal. No stridor. No respiratory distress. He has no wheezes. He has no rales. He exhibits no tenderness.  Abdominal: Soft. Bowel sounds are normal. He exhibits no distension and no mass. There is no abdominal  tenderness. There is no rebound and no guarding.  Genitourinary:    Genitourinary Comments: Not examined   Musculoskeletal: Normal range of motion.        General: No tenderness, deformity or edema.  Lymphadenopathy:    He has no cervical adenopathy.  Neurological: He is alert and  oriented to person, place, and time. He exhibits normal muscle tone. Coordination normal.  Skin: Skin is warm and dry. No rash noted. He is not diaphoretic. No erythema. No pallor.  Psychiatric: He has a normal mood and affect. His behavior is normal. Judgment and thought content normal.   Diagnostic Studies & Laboratory data:     Recent Radiology Findings:   Dg Chest 2 View  Result Date: 02/02/2018 CLINICAL DATA:  Awoke in with left-sided chest pain EXAM: CHEST - 2 VIEW COMPARISON:  10/10/2013 chest CT FINDINGS: Normal heart size and mediastinal contours. Artifact from EKG leads. No acute infiltrate or edema. No effusion or pneumothorax. No acute osseous findings. IMPRESSION: No active cardiopulmonary disease. Electronically Signed   By: Monte Fantasia M.D.   On: 02/02/2018 04:25     I have independently reviewed the above radiologic studies and discussed with the patient   Recent Lab Findings: Lab Results  Component Value Date   WBC 6.9 02/02/2018   HGB 15.2 02/02/2018   HCT 46.3 02/02/2018   PLT 231 02/02/2018   GLUCOSE 112 (H) 02/02/2018   CHOL 174 11/07/2017   TRIG 116 11/07/2017   HDL 56 11/07/2017   LDLCALC 97 11/07/2017   ALT 15 11/07/2017   AST 17 11/07/2017   NA 141 02/02/2018   K 3.9 02/02/2018   CL 105 02/02/2018   CREATININE 1.13 02/02/2018   BUN 19 02/02/2018   CO2 27 02/02/2018   TSH 3.65 06/18/2015   INR 1.05 02/10/2011   HGBA1C 5.3 06/18/2015     There is severe left ventricular systolic dysfunction.  LV end diastolic pressure is moderately elevated.  The left ventricular ejection fraction is 25-35% by visual estimate.  There is trivial (1+) mitral regurgitation.  Dist LM lesion is 50% stenosed.  Prox LAD lesion is 95% stenosed.  Ost Cx to Prox Cx lesion is 90% stenosed.  Mid RCA lesion is 75% stenosed.  Ost RPDA to RPDA lesion is 90% stenosed.   1. Severe 3 vessel CAD with some distal left main disease as well 2. Severe LV dysfunction 3.  Moderately elevated LVEDP  Plan: recommend CT surgery consult for CABG. Resume IV heparin. Start IV Ntg. Diuresis. Currently he is pain free.    Recommendations   Antiplatelet/Anticoag Recommend Aspirin 81mg  daily for moderate CAD.  Indications   Non-ST elevation (NSTEMI) myocardial infarction ([place]) [I21.4 (ICD-10-CM)]  Procedural Details   Technical Details Indication: 78 yo WM with history of HTN and HLD presents with NSTEMI  Procedural Details: The right wrist was prepped, draped, and anesthetized with 1% lidocaine. Using the modified Seldinger technique, a 6 French slender sheath was introduced into the right radial artery. 3 mg of verapamil was administered through the sheath, weight-based unfractionated heparin was administered intravenously. Standard Judkins catheters were used for selective coronary angiography and left ventriculography. There was significant tortuosity of the innominate artery. The RCA was engaged with a ERAD right catheter. Catheter exchanges were performed over an exchange length guidewire. There were no immediate procedural complications. A TR band was used for radial hemostasis at the completion of the procedure. The patient was transferred to the post catheterization recovery area for  further monitoring.  Contrast: 130 cc  Estimated blood loss <50 mL.   During this procedure medications were administered to achieve and maintain moderate conscious sedation while the patient's heart rate, blood pressure, and oxygen saturation were continuously monitored and I was present face-to-face 100% of this time.  Medications  (Filter: Administrations occurring from 02/02/18 0930 to 02/02/18 1029)  Medication Rate/Dose/Volume Action  Date Time   fentaNYL (SUBLIMAZE) injection (mcg) 25 mcg Given 02/02/18 0945   Total dose as of 02/02/18 1149        25 mcg        midazolam (VERSED) injection (mg) 1 mg Given 02/02/18 0945   Total dose as of 02/02/18 1149        1 mg         lidocaine (PF) (XYLOCAINE) 1 % injection (mL) 2 mL Given 02/02/18 0948   Total dose as of 02/02/18 1149        2 mL        Radial Cocktail/Verapamil only (mL) 10 mL Given 02/02/18 0948   Total dose as of 02/02/18 1149        10 mL        heparin injection (Units) 4,500 Units Given 02/02/18 0951   Total dose as of 02/02/18 1149        4,500 Units        Heparin (Porcine) in NaCl 1000-0.9 UT/500ML-% SOLN (mL) 500 mL Given 02/02/18 0956   Total dose as of 02/02/18 1149 500 mL Given 0956   1,000 mL        iohexol (OMNIPAQUE) 350 MG/ML injection (mL) 130 mL Given 02/02/18 1015   Total dose as of 02/02/18 1149        130 mL        nitroGLYCERIN 50 mg in dextrose 5 % 250 mL (0.2 mg/mL) infusion (mcg/min) 5 mcg/min - 1.5 mL/hr New Bag/Given 02/02/18 1022   Dosing weight:  81.6 kg        Total dose as of 02/02/18 1149        Cannot be calculated        acetaminophen (TYLENOL) tablet 650 mg (mg) *Not included in total [facility] Hold 02/02/18 0935   Dosing weight:  81.6 kg        Total dose as of 02/02/18 1149        Cannot be calculated        aspirin EC tablet 81 mg (mg) *Not included in total [facility] Hold 02/02/18 0935   Dosing weight:  81.6 kg        Total dose as of 02/02/18 1149        Cannot be calculated        atorvastatin (LIPITOR) tablet 80 mg (mg) *Not included in total [facility] Hold 02/02/18 0935   Dosing weight:  81.6 kg        Total dose as of 02/02/18 1149        Cannot be calculated        metoprolol tartrate (LOPRESSOR) tablet 12.5 mg (mg) *Not included in total [facility] Hold 02/02/18 0935   Dosing weight:  81.6 kg *Not included in total [employer] Held 1000   Total dose as of 02/02/18 1149        Cannot be calculated        nitroGLYCERIN (NITROSTAT) SL tablet 0.4 mg (mg) *Not included in total [facility] Hold 02/02/18 0935   Dosing weight:  81.6 kg  Total dose as of 02/02/18 1149        Cannot be calculated        pantoprazole (PROTONIX) EC tablet 40 mg (mg) *Not included in total [facility]  Hold 02/02/18 0935   Dosing weight:  81.6 kg *Not included in total Automatically Held 1000   Total dose as of 02/02/18 1149        Cannot be calculated        sodium chloride flush (NS) 0.9 % injection 3 mL (mL) *Not included in total [facility] Hold 02/02/18 0935   Dosing weight:  81.6 kg        Total dose as of 02/02/18 1149        Cannot be calculated        Sedation Time   Sedation Time Physician-1: 26 minutes 51 seconds  Complications   Complications documented before study signed (02/02/2018 10:31 AM EST)    No complications were associated with this study.  Documented by [place], Peter M, MD - 02/02/2018 10:29 AM EST    Coronary Findings   Diagnostic  Dominance: Right  Left Main  Dist LM lesion 50% stenosed  Dist LM lesion is 50% stenosed. The lesion is discrete.  Left Anterior Descending  There is a dual LAD with a large primarily septal branch and a large diagonal branch.  Prox LAD lesion 95% stenosed  Prox LAD lesion is 95% stenosed.  Left Circumflex  Ost Cx to Prox Cx lesion 90% stenosed  Ost Cx to Prox Cx lesion is 90% stenosed.  Right Coronary Artery  Mid RCA lesion 75% stenosed  Mid RCA lesion is 75% stenosed.  Right Posterior Descending Artery  Ost RPDA to RPDA lesion 90% stenosed  Ost RPDA to RPDA lesion is 90% stenosed.  Intervention   No interventions have been documented.  Wall Motion   Resting               Left Heart   Left Ventricle The left ventricle is mildly dilated. There is severe left ventricular systolic dysfunction. LV end diastolic pressure is moderately elevated. The left ventricular ejection fraction is 25-35% by visual estimate. There are LV function abnormalities due to segmental dysfunction. There is trivial (1+) mitral regurgitation.  Coronary Diagrams   Diagnostic  Dominance: Right    Intervention     Assessment / Plan: Severe three-vessel coronary artery disease with distal left main disease presenting in the setting of  non-STEMI. Essential hypertension Remote history of tobacco abuse History of GERD History of multiple episodes of epistaxis  Plan: CABG    I  spent 60 minutes counseling the patient face to face.   John Giovanni, PA-C 02/02/2018 11:42 AM Page [phone]

## 2018-02-02 NOTE — Progress Notes (Signed)
[place] for heparin Indication: chest pain/ACS  Heparin Dosing Weight: 81.6 kg  Labs: Recent Labs    02/02/18 0342  HGB 15.2  HCT 46.3  PLT 231  CREATININE 1.13    Assessment: 89 yom presenting with CP, elevated troponin. Pharmacy consulted to dose heparin for ACS. Not on anticoagulation PTA. CBC wnl. No active bleed issues documented but does have a history of nosebleeds documented that required ER visit.  Goal of Therapy:  Heparin level 0.3-0.7 units/ml Monitor platelets by anticoagulation protocol: Yes   Plan:  Heparin 4000 unit bolus Start heparin at 1000 units/h 8h heparin level Daily heparin level/CBC Monitor s/sx bleeding F/u Cardiology plans  Elicia Lamp, PharmD, BCPS Clinical Pharmacist 02/02/2018 8:25 AM

## 2018-02-03 DIAGNOSIS — I10 Essential (primary) hypertension: Secondary | ICD-10-CM

## 2018-02-03 DIAGNOSIS — E785 Hyperlipidemia, unspecified: Secondary | ICD-10-CM

## 2018-02-03 DIAGNOSIS — I5021 Acute systolic (congestive) heart failure: Secondary | ICD-10-CM

## 2018-02-03 LAB — BASIC METABOLIC PANEL
Anion gap: 11 (ref 5–15)
BUN: 16 mg/dL (ref 8–23)
CO2: 25 mmol/L (ref 22–32)
Calcium: 8.7 mg/dL — ABNORMAL LOW (ref 8.9–10.3)
Chloride: 102 mmol/L (ref 98–111)
Creatinine, Ser: 1.09 mg/dL (ref 0.61–1.24)
GFR calc Af Amer: >60 mL/min (ref >60)
Glucose, Bld: 111 mg/dL — ABNORMAL HIGH (ref 70–99)
POTASSIUM: 3.6 mmol/L (ref 3.5–5.1)
Sodium: 138 mmol/L (ref 135–145)

## 2018-02-03 LAB — CBC
HCT: 42.7 % (ref 39.0–52.0)
Hemoglobin: 14.5 g/dL (ref 13.0–17.0)
MCH: 32.1 pg (ref 26.0–34.0)
MCHC: 34 g/dL (ref 30.0–36.0)
MCV: 94.5 fL (ref 80.0–100.0)
Platelets: 188 10*3/uL (ref 150–400)
RBC: 4.52 MIL/uL (ref 4.22–5.81)
RDW: 15 % (ref 11.5–15.5)
WBC: 8.6 10*3/uL (ref 4.0–10.5)
nRBC: 0 % (ref 0.0–0.2)

## 2018-02-03 LAB — LIPID PANEL
CHOL/HDL RATIO: 3.3 ratio
Cholesterol: 194 mg/dL (ref 0–200)
HDL: 59 mg/dL (ref >40)
LDL Cholesterol: 110 mg/dL — ABNORMAL HIGH (ref 0–99)
Triglycerides: 127 mg/dL (ref <150)
VLDL: 25 mg/dL (ref 0–40)

## 2018-02-03 LAB — HEPARIN LEVEL (UNFRACTIONATED)
Heparin Unfractionated: 0.19 IU/mL — ABNORMAL LOW (ref 0.30–0.70)
Heparin Unfractionated: 0.44 IU/mL (ref 0.30–0.70)

## 2018-02-03 MED ORDER — SODIUM CHLORIDE 0.9 % IV SOLN
750.0000 mg | INTRAVENOUS | Status: AC
  Administered 2018-02-05: 750 mg via INTRAVENOUS
  Filled 2018-02-03: qty 750

## 2018-02-03 MED ORDER — SODIUM CHLORIDE 0.9 % IV SOLN
1.5000 g | INTRAVENOUS | Status: AC
  Administered 2018-02-05: 1.5 g via INTRAVENOUS
  Filled 2018-02-03: qty 1.5

## 2018-02-03 MED ORDER — DEXMEDETOMIDINE HCL IN NACL 400 MCG/100ML IV SOLN
0.1000 ug/kg/h | INTRAVENOUS | Status: AC
  Administered 2018-02-05: 0.7 ug/kg/h via INTRAVENOUS
  Filled 2018-02-03: qty 100

## 2018-02-03 MED ORDER — POTASSIUM CHLORIDE 2 MEQ/ML IV SOLN
80.0000 meq | INTRAVENOUS | Status: DC
  Filled 2018-02-03: qty 40

## 2018-02-03 MED ORDER — NOREPINEPHRINE-SODIUM CHLORIDE 4-0.9 MG/250ML-% IV SOLN
0.0000 ug/min | INTRAVENOUS | Status: DC
  Filled 2018-02-03: qty 250

## 2018-02-03 MED ORDER — ALPRAZOLAM 0.25 MG PO TABS
0.2500 mg | ORAL_TABLET | Freq: Two times a day (BID) | ORAL | Status: DC | PRN
  Administered 2018-02-03 – 2018-02-04 (×3): 0.25 mg via ORAL
  Filled 2018-02-03 (×4): qty 1

## 2018-02-03 MED ORDER — PHENYLEPHRINE HCL-NACL 20-0.9 MG/250ML-% IV SOLN
30.0000 ug/min | INTRAVENOUS | Status: DC
  Filled 2018-02-03: qty 250

## 2018-02-03 MED ORDER — MILRINONE LACTATE IN DEXTROSE 20-5 MG/100ML-% IV SOLN
0.3000 ug/kg/min | INTRAVENOUS | Status: DC
  Filled 2018-02-03: qty 100

## 2018-02-03 MED ORDER — TRANEXAMIC ACID 1000 MG/10ML IV SOLN
1.5000 mg/kg/h | INTRAVENOUS | Status: AC
  Administered 2018-02-05: 1.5 mg/kg/h via INTRAVENOUS
  Filled 2018-02-03: qty 25

## 2018-02-03 MED ORDER — INSULIN REGULAR(HUMAN) IN NACL 100-0.9 UT/100ML-% IV SOLN
INTRAVENOUS | Status: AC
  Administered 2018-02-05: .9 [IU]/h via INTRAVENOUS
  Filled 2018-02-03: qty 100

## 2018-02-03 MED ORDER — MAGNESIUM SULFATE 50 % IJ SOLN
40.0000 meq | INTRAMUSCULAR | Status: DC
  Filled 2018-02-03 (×2): qty 9.85

## 2018-02-03 MED ORDER — TRANEXAMIC ACID (OHS) PUMP PRIME SOLUTION
2.0000 mg/kg | INTRAVENOUS | Status: DC
  Filled 2018-02-03: qty 1.55

## 2018-02-03 MED ORDER — VANCOMYCIN HCL 10 G IV SOLR
1250.0000 mg | INTRAVENOUS | Status: AC
  Administered 2018-02-05: 1250 mg via INTRAVENOUS
  Filled 2018-02-03: qty 1250

## 2018-02-03 MED ORDER — PLASMA-LYTE 148 IV SOLN
INTRAVENOUS | Status: DC
  Filled 2018-02-03: qty 2.5

## 2018-02-03 MED ORDER — SODIUM CHLORIDE 0.9 % IV SOLN
INTRAVENOUS | Status: DC
  Filled 2018-02-03: qty 30

## 2018-02-03 MED ORDER — TRANEXAMIC ACID (OHS) BOLUS VIA INFUSION
15.0000 mg/kg | INTRAVENOUS | Status: AC
  Administered 2018-02-05: 1164 mg via INTRAVENOUS
  Filled 2018-02-03: qty 1164

## 2018-02-03 MED ORDER — DOPAMINE-DEXTROSE 3.2-5 MG/ML-% IV SOLN
0.0000 ug/kg/min | INTRAVENOUS | Status: DC
  Filled 2018-02-03: qty 250

## 2018-02-03 MED ORDER — NITROGLYCERIN IN D5W 200-5 MCG/ML-% IV SOLN
2.0000 ug/min | INTRAVENOUS | Status: DC
  Filled 2018-02-03: qty 250

## 2018-02-03 MED ORDER — EPINEPHRINE PF 1 MG/ML IJ SOLN
0.0000 ug/min | INTRAVENOUS | Status: DC
  Filled 2018-02-03: qty 4

## 2018-02-03 NOTE — Progress Notes (Signed)
[place] for Heparin Indication: chest pain/ACS, CABG monday  Heparin Dosing Weight: 81.6 kg  Labs: Recent Labs    02/02/18 0342 02/02/18 1416 02/02/18 2100 02/03/18 0500  HGB 15.2  --   --  14.5  HCT 46.3  --   --  42.7  PLT 231  --   --  188  HEPARINUNFRC  --   --   --  0.19*  CREATININE 1.13  --   --  1.09  TROPONINI  --  4.38* 3.15*  --     Assessment: 60 yom presenting with CP, elevated troponin. Pharmacy consulted to dose heparin for ACS. Not on anticoagulation PTA. CBC wnl. No active bleed issues documented but does have a history of nosebleeds documented that required ER visit.  1/18 AM update: heparin level low after re-start, planning for CABG Monday, no issues per RN.   Goal of Therapy:  Heparin level 0.3-0.7 units/ml Monitor platelets by anticoagulation protocol: Yes   Plan:  Inc heparin to 1150 units/hr Re-check heparin level in 8 hours  Narda Bonds, PharmD, [place] Pharmacist Phone: [phone]

## 2018-02-03 NOTE — Progress Notes (Signed)
1 Day Post-Op Procedure(s) (LRB): LEFT HEART CATH AND CORONARY ANGIOGRAPHY (N/A) Subjective: No chest pain or shortness of breath walking in the halls.   Objective: Vital signs in last 24 hours: Temp:  [97.8 F (36.6 C)-98.3 F (36.8 C)] 98.3 F (36.8 C) (01/18 1521) Pulse Rate:  [75-85] 85 (01/18 0904) Cardiac Rhythm: Normal sinus rhythm (01/18 0904) Resp:  [17-27] 18 (01/18 1521) BP: (107-148)/(72-85) 107/72 (01/18 1521) SpO2:  [94 %] 94 % (01/18 1521) Weight:  [77.6 kg] 77.6 kg (01/18 0625)  Hemodynamic parameters for last 24 hours:    Intake/Output from previous day: 01/17 0701 - 01/18 0700 In: 1329.5 [P.O.:1200; I.V.:129.5] Out: 625 [Urine:625] Intake/Output this shift: No intake/output data recorded.  General appearance: alert and cooperative Heart: regular rate and rhythm, S1, S2 normal, no murmur, click, rub or gallop Lungs: clear to auscultation bilaterally  Lab Results: Recent Labs    02/02/18 0342 02/03/18 0500  WBC 6.9 8.6  HGB 15.2 14.5  HCT 46.3 42.7  PLT 231 188   BMET:  Recent Labs    02/02/18 0342 02/03/18 0500  NA 141 138  K 3.9 3.6  CL 105 102  CO2 27 25  GLUCOSE 112* 111*  BUN 19 16  CREATININE 1.13 1.09  CALCIUM 9.0 8.7*    PT/INR: No results for input(s): LABPROT, INR in the last 72 hours. ABG No results found for: PHART, HCO3, TCO2, ACIDBASEDEF, O2SAT CBG (last 3)  No results for input(s): GLUCAP in the last 72 hours.  Assessment/Plan: S/P Procedure(s) (LRB): LEFT HEART CATH AND CORONARY ANGIOGRAPHY (N/A)  Severe multi-vessel CAD with severe LV systolic dysfunction. Planning CABG in am Monday. Answered further questions for patient and his significant other.   LOS: 1 day    Gaye Pollack 02/03/2018

## 2018-02-03 NOTE — Progress Notes (Signed)
DAILY PROGRESS NOTE   Patient Name: Alex Martin Date of Encounter: 02/03/2018  Chief Complaint   No chest pain  Patient Profile   78 yo male with NSTEMI, found to have multivessel CAD and plans for CABG on 02/06/18.  Subjective   No chest pain overnight. Appreciate TCTS consultation - plans for CABG on Monday. Echo yesterday shows LVEF 30-35% with LAD territory WMA suggestive of infarct.  Objective   Vitals:   02/03/18 0000 02/03/18 0512 02/03/18 0625 02/03/18 0904  BP: 128/72 133/78 120/72 121/85  Pulse:   75 85  Resp: 18 17 (!) 27   Temp:   97.8 F (36.6 C)   TempSrc:   Oral   SpO2:   94%   Weight:   77.6 kg   Height:        Intake/Output Summary (Last 24 hours) at 02/03/2018 3825 Last data filed at 02/03/2018 0539 Gross per 24 hour  Intake 1329.5 ml  Output 875 ml  Net 454.5 ml   Filed Weights   02/02/18 0338 02/03/18 0625  Weight: 81.6 kg 77.6 kg    Physical Exam   General appearance: alert and no distress Neck: no JVD, supple, symmetrical, trachea midline and thyroid not enlarged, symmetric, no tenderness/mass/nodules Lungs: clear to auscultation bilaterally Heart: regular rate and rhythm Abdomen: soft, non-tender; bowel sounds normal; no masses,  no organomegaly Extremities: extremities normal, atraumatic, no cyanosis or edema Pulses: 2+ and symmetric Skin: Skin color, texture, turgor normal. No rashes or lesions Neurologic: Grossly normal Psych: Pleasant  Inpatient Medications    Scheduled Meds: . aspirin EC  81 mg Oral Daily  . atorvastatin  80 mg Oral q1800  . furosemide  40 mg Oral BID  . metoprolol tartrate  12.5 mg Oral BID  . pantoprazole  40 mg Oral Daily  . sodium chloride flush  3 mL Intravenous Once  . sodium chloride flush  3 mL Intravenous Q12H    Continuous Infusions: . sodium chloride    . heparin 1,150 Units/hr (02/03/18 7673)  . nitroGLYCERIN      PRN Meds: sodium chloride, acetaminophen, nitroGLYCERIN,  ondansetron (ZOFRAN) IV, sodium chloride flush   Labs   Results for orders placed or performed during the hospital encounter of 02/02/18 (from the past 48 hour(s))  I-stat troponin, ED     Status: None   Collection Time: 02/02/18  3:41 AM  Result Value Ref Range   Troponin i, poc 0.04 0.00 - 0.08 ng/mL   Comment 3            Comment: Due to the release kinetics of cTnI, a negative result within the first hours of the onset of symptoms does not rule out myocardial infarction with certainty. If myocardial infarction is still suspected, repeat the test at appropriate intervals.   Basic metabolic panel     Status: Abnormal   Collection Time: 02/02/18  3:42 AM  Result Value Ref Range   Sodium 141 135 - 145 mmol/L   Potassium 3.9 3.5 - 5.1 mmol/L   Chloride 105 98 - 111 mmol/L   CO2 27 22 - 32 mmol/L   Glucose, Bld 112 (H) 70 - 99 mg/dL   BUN 19 8 - 23 mg/dL   Creatinine, Ser 1.13 0.61 - 1.24 mg/dL   Calcium 9.0 8.9 - 10.3 mg/dL   GFR calc non Af Amer >60 >60 mL/min   GFR calc Af Amer >60 >60 mL/min   Anion gap 9 5 - 15  Comment: Performed at [facility], [place] [address] [place], [place] [address]  CBC     Status: None   Collection Time: 02/02/18  3:42 AM  Result Value Ref Range   WBC 6.9 4.0 - 10.5 K/uL   RBC 4.70 4.22 - 5.81 MIL/uL   Hemoglobin 15.2 13.0 - 17.0 g/dL   HCT 46.3 39.0 - 52.0 %   MCV 98.5 80.0 - 100.0 fL   MCH 32.3 26.0 - 34.0 pg   MCHC 32.8 30.0 - 36.0 g/dL   RDW 14.6 11.5 - 15.5 %   Platelets 231 150 - 400 K/uL   nRBC 0.0 0.0 - 0.2 %    Comment: Performed at [facility], [place] [address] [place], [place] [address]  I-stat troponin, ED     Status: Abnormal   Collection Time: 02/02/18  8:10 AM  Result Value Ref Range   Troponin i, poc 2.28 (HH) 0.00 - 0.08 ng/mL   Comment NOTIFIED PHYSICIAN    Comment 3            Comment: Due to the release kinetics of cTnI, a negative result within the first hours of the onset of symptoms does not rule  out myocardial infarction with certainty. If myocardial infarction is still suspected, repeat the test at appropriate intervals.   Troponin I - Now Then Q6H     Status: Abnormal   Collection Time: 02/02/18  2:16 PM  Result Value Ref Range   Troponin I 4.38 (HH) <0.03 ng/mL    Comment: CRITICAL RESULT CALLED TO, READ BACK BY AND VERIFIED WITH: K DO RN 1517 02/02/2018 BY A BENNETT Performed at [facility], [place] [address], [place], [place] [address]   Hemoglobin A1c     Status: None   Collection Time: 02/02/18  2:16 PM  Result Value Ref Range   Hgb A1c MFr Bld 5.5 4.8 - 5.6 %    Comment: (NOTE) Pre diabetes:          5.7%-6.4% Diabetes:              >6.4% Glycemic control for   <7.0% adults with diabetes    Mean Plasma Glucose 111.15 mg/dL    Comment: Performed at [facility] [address], [place], [place] [address]  Troponin I - Now Then Q6H     Status: Abnormal   Collection Time: 02/02/18  9:00 PM  Result Value Ref Range   Troponin I 3.15 (HH) <0.03 ng/mL    Comment: CRITICAL VALUE NOTED.  VALUE IS CONSISTENT WITH PREVIOUSLY REPORTED AND CALLED VALUE. Performed at [facility], [place] [address] [place] [address]   CBC     Status: None   Collection Time: 02/03/18  5:00 AM  Result Value Ref Range   WBC 8.6 4.0 - 10.5 K/uL   RBC 4.52 4.22 - 5.81 MIL/uL   Hemoglobin 14.5 13.0 - 17.0 g/dL   HCT 42.7 39.0 - 52.0 %   MCV 94.5 80.0 - 100.0 fL   MCH 32.1 26.0 - 34.0 pg   MCHC 34.0 30.0 - 36.0 g/dL   RDW 15.0 11.5 - 15.5 %   Platelets 188 150 - 400 K/uL   nRBC 0.0 0.0 - 0.2 %    Comment: Performed at [facility], [place] [address] [place], [place] [address]  Basic metabolic panel     Status: Abnormal   Collection Time: 02/03/18  5:00 AM  Result Value Ref Range   Sodium 138 135 - 145 mmol/L   Potassium 3.6 3.5 - 5.1 mmol/L   Chloride 102 98 - 111 mmol/L   CO2 25 22 - 32 mmol/L   Glucose, Bld 111 (H) 70 - 99 mg/dL   BUN 16 8 - 23 mg/dL    Creatinine, Ser 1.09 0.61 - 1.24 mg/dL   Calcium 8.7 (L) 8.9 - 10.3 mg/dL   GFR calc non Af Amer >60 >60 mL/min   GFR calc Af Amer >60 >60 mL/min   Anion gap 11 5 - 15    Comment: Performed at [facility] [address], [place], [place] [address]  Lipid panel     Status: Abnormal   Collection Time: 02/03/18  5:00 AM  Result Value Ref Range   Cholesterol 194 0 - 200 mg/dL   Triglycerides 127 <150 mg/dL   HDL 59 >40 mg/dL   Total CHOL/HDL Ratio 3.3 RATIO   VLDL 25 0 - 40 mg/dL   LDL Cholesterol 110 (H) 0 - 99 mg/dL    Comment:        Total Cholesterol/HDL:CHD Risk Coronary Heart Disease Risk Table                     Men   Women  1/2 Average Risk   3.4   3.3  Average Risk       5.0   4.4  2 X Average Risk   9.6   7.1  3 X Average Risk  23.4   11.0        Use the calculated Patient Ratio above and the CHD Risk Table to determine the patient's CHD Risk.        ATP III CLASSIFICATION (LDL):  <100     mg/dL   Optimal  100-129  mg/dL   Near or Above                    Optimal  130-159  mg/dL   Borderline  160-189  mg/dL   High  >190     mg/dL   Very High Performed at [facility] [address], [place], [place] [address]   Heparin level (unfractionated)     Status: Abnormal   Collection Time: 02/03/18  5:00 AM  Result Value Ref Range   Heparin Unfractionated 0.19 (L) 0.30 - 0.70 IU/mL    Comment: (NOTE) If heparin results are below expected values, and patient dosage has  been confirmed, suggest follow up testing of antithrombin III levels. Performed at [facility], [place] [address], [place], [place] [address]     ECG   N/A  Telemetry   Sinus rhythm - Personally Reviewed  Radiology    Dg Chest 2 View  Result Date: 02/02/2018 CLINICAL DATA:  Awoke in with left-sided chest pain EXAM: CHEST - 2 VIEW COMPARISON:  10/10/2013 chest CT FINDINGS: Normal heart size and mediastinal contours. Artifact from EKG leads. No acute infiltrate or edema. No  effusion or pneumothorax. No acute osseous findings. IMPRESSION: No active cardiopulmonary disease. Electronically Signed   By: Monte Fantasia M.D.   On: 02/02/2018 04:25   Vas US Doppler Pre Cabg  Result Date: 02/02/2018 PREOPERATIVE VASCULAR EVALUATION  Indications:      Pre CABG evaluation. Risk Factors:     Diabetes. Comparison Study: No comparison study available Performing Technologist: Rudell Cobb Supporting Technologist: Maudry Mayhew RDMS, RVT, RDCS  Examination  Guidelines: A complete evaluation includes B-mode imaging, spectral Doppler, color Doppler, and power Doppler as needed of all accessible portions of each vessel. Bilateral testing is considered an integral part of a complete examination. Limited examinations for reoccurring indications may be performed as noted.  Right Carotid Findings: +----------+--------+--------+--------+---------------------+--------+           PSV cm/sEDV cm/sStenosisDescribe             Comments +----------+--------+--------+--------+---------------------+--------+ CCA Prox  51      12                                            +----------+--------+--------+--------+---------------------+--------+ CCA Distal75      22              hypoechoic                    +----------+--------+--------+--------+---------------------+--------+ ICA Prox  88      26      1-39%   focal and hyperechoic         +----------+--------+--------+--------+---------------------+--------+ ICA Mid   107     27                                            +----------+--------+--------+--------+---------------------+--------+ ICA Distal84      18                                            +----------+--------+--------+--------+---------------------+--------+ ECA       122     13                                            +----------+--------+--------+--------+---------------------+--------+ Portions of this table do not appear on this page.  +----------+--------+-------+--------+------------+           PSV cm/sEDV cmsDescribeArm Pressure +----------+--------+-------+--------+------------+ [id number]                                  +----------+--------+-------+--------+------------+ +---------+--------+--+--------+--+---------+ VertebralPSV cm/s34EDV cm/s15Antegrade +---------+--------+--+--------+--+---------+ Left Carotid Findings: +----------+--------+--------+--------+------------------+--------+           PSV cm/sEDV cm/sStenosisDescribe          Comments +----------+--------+--------+--------+------------------+--------+ CCA Prox  73      21                                         +----------+--------+--------+--------+------------------+--------+ CCA Distal83      20              focal and calcific         +----------+--------+--------+--------+------------------+--------+ ICA Prox  108     28      1-39%   focal and calcific         +----------+--------+--------+--------+------------------+--------+ ICA Distal90      28                                         +----------+--------+--------+--------+------------------+--------+  ECA       85                                                 +----------+--------+--------+--------+------------------+--------+ +----------+--------+--------+--------+------------+ SubclavianPSV cm/sEDV cm/sDescribeArm Pressure +----------+--------+--------+--------+------------+           174                                  +----------+--------+--------+--------+------------+ +---------+--------+--+--------+--+---------+ VertebralPSV cm/s76EDV cm/s22Antegrade +---------+--------+--+--------+--+---------+  ABI Findings: +--------+------------------+-----+---------+----------------------------------+ Right   Rt Pressure (mmHg)IndexWaveform Comment                             +--------+------------------+-----+---------+----------------------------------+ Brachial                       biphasic Unable to obtain due to                                                    catheterization on site            +--------+------------------+-----+---------+----------------------------------+ PTA                            triphasic                                   +--------+------------------+-----+---------+----------------------------------+ DP                             triphasic                                   +--------+------------------+-----+---------+----------------------------------+ +--------+------------------+-----+---------+-------+ Left    Lt Pressure (mmHg)IndexWaveform Comment +--------+------------------+-----+---------+-------+ Brachial                       triphasic133     +--------+------------------+-----+---------+-------+ PTA                            triphasic        +--------+------------------+-----+---------+-------+ DP                             triphasic        +--------+------------------+-----+---------+-------+  Right Doppler Findings: +--------+--------+-----+---------+-------------------------------------------+ Site    PressureIndexDoppler  Comments                                    +--------+--------+-----+---------+-------------------------------------------+ Brachial             biphasic Unable to obtain due to catheterization on                                site                                        +--------+--------+-----+---------+-------------------------------------------+  Radial               triphasic                                            +--------+--------+-----+---------+-------------------------------------------+ Ulnar                triphasic                                             +--------+--------+-----+---------+-------------------------------------------+  Left Doppler Findings: +--------+--------+-----+---------+--------+ Site    PressureIndexDoppler  Comments +--------+--------+-----+---------+--------+ Brachial             [phone]      +--------+--------+-----+---------+--------+ Radial               triphasic         +--------+--------+-----+---------+--------+ Ulnar                triphasic         +--------+--------+-----+---------+--------+  Summary: Right Carotid: Velocities in the right ICA are consistent with a 1-39% stenosis. Left Carotid: Velocities in the left ICA are consistent with a 1-39% stenosis. Vertebrals: Bilateral vertebral arteries demonstrate antegrade flow. Right Upper Extremity: Doppler waveforms remain within normal limits with right radial compression. Doppler waveforms remain within normal limits with right ulnar compression. Left Upper Extremity: Doppler waveform obliterate with left radial compression. Doppler waveforms remain within normal limits with left ulnar compression.     Preliminary     Cardiac Studies   LV EF: 30% -   35%  ------------------------------------------------------------------- History:   Risk factors:  Hypertension.  ------------------------------------------------------------------- Study Conclusions  - Left ventricle: The cavity size was normal. Wall thickness was   increased in a pattern of moderate LVH. Basal to mid inferoseptal   and anteroseptal akinesis. Apical lateral akinesis. Akinesis of   the true apex. Apical anterior akinesis. Apical inferior   akinesis. Systolic function was moderately to severely reduced.   The estimated ejection fraction was in the range of 30% to 35%.   Doppler parameters are consistent with abnormal left ventricular   relaxation (grade 1 diastolic dysfunction). - Aortic valve: There was no stenosis. There was trivial   regurgitation. - Mitral  valve: Mildly calcified annulus. Mildly calcified leaflets   . There was no significant regurgitation. - Right ventricle: The cavity size was normal. Systolic function   was normal. - Tricuspid valve: Peak RV-RA gradient (S): 37 mm Hg. - Pulmonary arteries: PA peak pressure: 40 mm Hg (S). - Inferior vena cava: The vessel was normal in size. The   respirophasic diameter changes were in the normal range (>= 50%),   consistent with normal central venous pressure.  Impressions:  - Normal LV size with moderate LV hypertrophy. EF 30-35%, wall   motion abnormalities in the pattern of a LAD infarct. Normal RV   size and systolic function. No significant valvular   abnormalities. Mild pulmonary hypertension.  Assessment   Principal Problem:   NSTEMI (non-ST elevated myocardial infarction) ([place]) Active Problems:   Essential hypertension   Dyslipidemia, goal LDL below 70   Acute systolic heart failure ([place])   Plan   1. Symptomatically improving - echo suggests anterior infarct with low EF of 30-35% - ?need for viability assessment, however, CT  surgery hopefully for WMA improvement with planned revascularization on Monday. LDL 110 - on high potency atorvastatin. On BID lasix - weight down significantly, however, I's/O's do not appear accurate. Troponin trending down. Creatinine improved with diuresis.   Time Spent Directly with Patient:  I have spent a total of 25 minutes with the patient reviewing hospital notes, telemetry, EKGs, labs and examining the patient as well as establishing an assessment and plan that was discussed personally with the patient.  > 50% of time was spent in direct patient care.  Length of Stay:  LOS: 1 day   Pixie Casino, MD, [facility], [place] Director of the Advanced Lipid Disorders &  Cardiovascular Risk Reduction Clinic Diplomate of the American Board of Clinical Lipidology Attending Cardiologist  Direct Dial:  [phone]  Fax: [phone]  Website:  [email or web address] Domini Vandehei 02/03/2018, 9:25 AM

## 2018-02-03 NOTE — Progress Notes (Signed)
CARDIAC REHAB PHASE I   PRE:  Rate/Rhythm: 75 SR    BP: sitting 128/85    SaO2:   MODE:  Ambulation: 470 ft   POST:  Rate/Rhythm: 93 SR    BP: sitting 133/80     SaO2:   Pt able to ambulate, no c/o. Steady, VSS. To recliner. Discussed sternal precautions, IS (2250 mL), mobility post op, and d/c planning. Pt plans to go to his s/o house at d/c. He is already reading materials and will watch preop video with his girlfriend.  He can walk in hall if staff will set him up. Dean, ACSM 02/03/2018 9:11 AM

## 2018-02-03 NOTE — Progress Notes (Signed)
[facility] for Heparin Indication: chest pain/ACS, CABG monday  Heparin Dosing Weight: 81.6 kg  Labs: Recent Labs    02/02/18 0342 02/02/18 1416 02/02/18 2100 02/03/18 0500 02/03/18 1415  HGB 15.2  --   --  14.5  --   HCT 46.3  --   --  42.7  --   PLT 231  --   --  188  --   HEPARINUNFRC  --   --   --  0.19* 0.44  CREATININE 1.13  --   --  1.09  --   TROPONINI  --  4.38* 3.15*  --   --     Assessment: 75 yom presenting with CP, elevated troponin. Pharmacy consulted to dose heparin for ACS. Not on anticoagulation PTA. CBC wnl. No active bleed issues documented but does have a history of nosebleeds documented that required ER visit.  Heparin now therapeutic after rate adjustment this morning.  Goal of Therapy:  Heparin level 0.3-0.7 units/ml Monitor platelets by anticoagulation protocol: Yes   Plan:  Continue heparin 1150 units/hr Daily heparin level and CBC CABG Monday  Arrie Senate, PharmD, BCPS Clinical Pharmacist Please check AMION for all [facility] numbers 02/03/2018

## 2018-02-04 DIAGNOSIS — I25110 Atherosclerotic heart disease of native coronary artery with unstable angina pectoris: Secondary | ICD-10-CM

## 2018-02-04 LAB — BASIC METABOLIC PANEL
Anion gap: 10 (ref 5–15)
BUN: 17 mg/dL (ref 8–23)
CO2: 26 mmol/L (ref 22–32)
Calcium: 8.7 mg/dL — ABNORMAL LOW (ref 8.9–10.3)
Chloride: 103 mmol/L (ref 98–111)
Creatinine, Ser: 1.11 mg/dL (ref 0.61–1.24)
GFR calc Af Amer: >60 mL/min (ref >60)
GFR calc non Af Amer: >60 mL/min (ref >60)
GLUCOSE: 107 mg/dL — AB (ref 70–99)
Potassium: 3.6 mmol/L (ref 3.5–5.1)
Sodium: 139 mmol/L (ref 135–145)

## 2018-02-04 LAB — BLOOD GAS, ARTERIAL
Acid-Base Excess: 3.1 mmol/L — ABNORMAL HIGH (ref 0.0–2.0)
Bicarbonate: 26.6 mmol/L (ref 20.0–28.0)
Drawn by: 51185
FIO2: 21
O2 Saturation: 94 %
PCO2 ART: 37.5 mmHg (ref 32.0–48.0)
Patient temperature: 98.6
pH, Arterial: 7.466 — ABNORMAL HIGH (ref 7.350–7.450)
pO2, Arterial: 70.7 mmHg — ABNORMAL LOW (ref 83.0–108.0)

## 2018-02-04 LAB — URINALYSIS, ROUTINE W REFLEX MICROSCOPIC
Bacteria, UA: NONE SEEN
Bilirubin Urine: NEGATIVE
Glucose, UA: NEGATIVE mg/dL
Ketones, ur: NEGATIVE mg/dL
Leukocytes, UA: NEGATIVE
Nitrite: NEGATIVE
PH: 6 (ref 5.0–8.0)
Protein, ur: NEGATIVE mg/dL
Specific Gravity, Urine: 1.006 (ref 1.005–1.030)

## 2018-02-04 LAB — PROTIME-INR
INR: 1.07
Prothrombin Time: 13.8 seconds (ref 11.4–15.2)

## 2018-02-04 LAB — CBC
HCT: 42.6 % (ref 39.0–52.0)
Hemoglobin: 14.5 g/dL (ref 13.0–17.0)
MCH: 32 pg (ref 26.0–34.0)
MCHC: 34 g/dL (ref 30.0–36.0)
MCV: 94 fL (ref 80.0–100.0)
Platelets: 180 10*3/uL (ref 150–400)
RBC: 4.53 MIL/uL (ref 4.22–5.81)
RDW: 14.8 % (ref 11.5–15.5)
WBC: 7.5 10*3/uL (ref 4.0–10.5)
nRBC: 0 % (ref 0.0–0.2)

## 2018-02-04 LAB — APTT: aPTT: 28 seconds (ref 24–36)

## 2018-02-04 LAB — TYPE AND SCREEN
ABO/RH(D): A POS
Antibody Screen: NEGATIVE

## 2018-02-04 LAB — SURGICAL PCR SCREEN
MRSA, PCR: NEGATIVE
Staphylococcus aureus: NEGATIVE

## 2018-02-04 LAB — ABO/RH: ABO/RH(D): A POS

## 2018-02-04 LAB — HEPARIN LEVEL (UNFRACTIONATED): Heparin Unfractionated: 0.44 IU/mL (ref 0.30–0.70)

## 2018-02-04 MED ORDER — CHLORHEXIDINE GLUCONATE CLOTH 2 % EX PADS: 6.0000 | MEDICATED_PAD | Freq: Once | CUTANEOUS | Status: DC

## 2018-02-04 MED ORDER — DIAZEPAM 5 MG PO TABS
5.0000 mg | ORAL_TABLET | Freq: Once | ORAL | Status: AC
  Administered 2018-02-05: 5 mg via ORAL
  Filled 2018-02-04: qty 1

## 2018-02-04 MED ORDER — METOPROLOL TARTRATE 12.5 MG HALF TABLET
12.5000 mg | ORAL_TABLET | Freq: Once | ORAL | Status: AC
  Administered 2018-02-05: 12.5 mg via ORAL
  Filled 2018-02-04: qty 1

## 2018-02-04 MED ORDER — BISACODYL 5 MG PO TBEC
5.0000 mg | DELAYED_RELEASE_TABLET | Freq: Once | ORAL | Status: DC
  Filled 2018-02-04: qty 1

## 2018-02-04 MED ORDER — CHLORHEXIDINE GLUCONATE 0.12 % MT SOLN
15.0000 mL | Freq: Once | OROMUCOSAL | Status: AC
  Administered 2018-02-05: 15 mL via OROMUCOSAL
  Filled 2018-02-04: qty 15

## 2018-02-04 MED ORDER — TEMAZEPAM 15 MG PO CAPS: 15.0000 mg | ORAL_CAPSULE | Freq: Once | ORAL | Status: DC | PRN

## 2018-02-04 MED ORDER — CHLORHEXIDINE GLUCONATE CLOTH 2 % EX PADS
6.0000 | MEDICATED_PAD | Freq: Once | CUTANEOUS | Status: AC
  Administered 2018-02-04: 6 via TOPICAL

## 2018-02-04 NOTE — Progress Notes (Signed)
DAILY PROGRESS NOTE   Patient Name: Alex Martin Date of Encounter: 02/04/2018  Chief Complaint   No issues overnight  Patient Profile   78 yo male with NSTEMI, found to have multivessel CAD and plans for CABG on 02/06/18.  Subjective   Ambulating without chest pain - plan for CABG tomorrow.  Objective   Vitals:   02/03/18 0904 02/03/18 1521 02/03/18 2026 02/04/18 0628  BP: 121/85 107/72 132/70 129/68  Pulse: 85  78 70  Resp:  18 18 18   Temp:  98.3 F (36.8 C) 98.3 F (36.8 C) 98.1 F (36.7 C)  TempSrc:  Oral Oral Oral  SpO2:  94% 95% 95%  Weight:    76.8 kg  Height:        Intake/Output Summary (Last 24 hours) at 02/04/2018 0931 Last data filed at 02/04/2018 0631 Gross per 24 hour  Intake 345 ml  Output 800 ml  Net -455 ml   Filed Weights   02/02/18 0338 02/03/18 0625 02/04/18 0628  Weight: 81.6 kg 77.6 kg 76.8 kg    Physical Exam   General appearance: alert and no distress Neck: no JVD, supple, symmetrical, trachea midline and thyroid not enlarged, symmetric, no tenderness/mass/nodules Lungs: clear to auscultation bilaterally Heart: regular rate and rhythm Abdomen: soft, non-tender; bowel sounds normal; no masses,  no organomegaly Extremities: extremities normal, atraumatic, no cyanosis or edema Pulses: 2+ and symmetric Skin: Skin color, texture, turgor normal. No rashes or lesions Neurologic: Grossly normal Psych: Pleasant  Inpatient Medications    Scheduled Meds: . aspirin EC  81 mg Oral Daily  . atorvastatin  80 mg Oral q1800  . furosemide  40 mg Oral BID  . [START ON 02/05/2018] heparin-papaverine-plasmalyte irrigation   Irrigation To OR  . [START ON 02/05/2018] insulin   Intravenous To OR  . [START ON 02/05/2018] magnesium sulfate  40 mEq Other To OR  . metoprolol tartrate  12.5 mg Oral BID  . [START ON 02/05/2018] norepinephrine (LEVOPHED) Adult infusion  0-40 mcg/min Intravenous To OR  . pantoprazole  40 mg Oral Daily  . [START ON  02/05/2018] phenylephrine  30-200 mcg/min Intravenous To OR  . [START ON 02/05/2018] potassium chloride  80 mEq Other To OR  . sodium chloride flush  3 mL Intravenous Once  . sodium chloride flush  3 mL Intravenous Q12H  . [START ON 02/05/2018] tranexamic acid  15 mg/kg Intravenous To OR  . [START ON 02/05/2018] tranexamic acid  2 mg/kg Intracatheter To OR    Continuous Infusions: . sodium chloride    . [START ON 02/05/2018] cefUROXime (ZINACEF)  IV    . [START ON 02/05/2018] cefUROXime (ZINACEF)  IV    . [START ON 02/05/2018] dexmedetomidine    . [START ON 02/05/2018] DOPamine    . [START ON 02/05/2018] epinephrine    . [START ON 02/05/2018] heparin 30,000 units/NS 1000 mL solution for CELLSAVER    . heparin 1,150 Units/hr (02/03/18 1506)  . [START ON 02/05/2018] milrinone    . nitroGLYCERIN Stopped (02/03/18 3734)  . [START ON 02/05/2018] nitroGLYCERIN    . [START ON 02/05/2018] tranexamic acid (CYKLOKAPRON) infusion ([facility])    . [START ON 02/05/2018] vancomycin      PRN Meds: sodium chloride, acetaminophen, ALPRAZolam, nitroGLYCERIN, ondansetron (ZOFRAN) IV, sodium chloride flush   Labs   Results for orders placed or performed during the hospital encounter of 02/02/18 (from the past 48 hour(s))  Troponin I - Now Then Q6H     Status:  Abnormal   Collection Time: 02/02/18  2:16 PM  Result Value Ref Range   Troponin I 4.38 (HH) <0.03 ng/mL    Comment: CRITICAL RESULT CALLED TO, READ BACK BY AND VERIFIED WITH: K DO RN 1517 02/02/2018 BY A BENNETT Performed at [facility], [place] [address] [place], [place] [address]   Hemoglobin A1c     Status: None   Collection Time: 02/02/18  2:16 PM  Result Value Ref Range   Hgb A1c MFr Bld 5.5 4.8 - 5.6 %    Comment: (NOTE) Pre diabetes:          5.7%-6.4% Diabetes:              >6.4% Glycemic control for   <7.0% adults with diabetes    Mean Plasma Glucose 111.15 mg/dL    Comment: Performed at [facility] [address],  [place], [place] [address]  Troponin I - Now Then Q6H     Status: Abnormal   Collection Time: 02/02/18  9:00 PM  Result Value Ref Range   Troponin I 3.15 (HH) <0.03 ng/mL    Comment: CRITICAL VALUE NOTED.  VALUE IS CONSISTENT WITH PREVIOUSLY REPORTED AND CALLED VALUE. Performed at [facility], [place] [address], [place] [address]   CBC     Status: None   Collection Time: 02/03/18  5:00 AM  Result Value Ref Range   WBC 8.6 4.0 - 10.5 K/uL   RBC 4.52 4.22 - 5.81 MIL/uL   Hemoglobin 14.5 13.0 - 17.0 g/dL   HCT 42.7 39.0 - 52.0 %   MCV 94.5 80.0 - 100.0 fL   MCH 32.1 26.0 - 34.0 pg   MCHC 34.0 30.0 - 36.0 g/dL   RDW 15.0 11.5 - 15.5 %   Platelets 188 150 - 400 K/uL   nRBC 0.0 0.0 - 0.2 %    Comment: Performed at [facility], [place] [address] [place], [place] [address]  Basic metabolic panel     Status: Abnormal   Collection Time: 02/03/18  5:00 AM  Result Value Ref Range   Sodium 138 135 - 145 mmol/L   Potassium 3.6 3.5 - 5.1 mmol/L   Chloride 102 98 - 111 mmol/L   CO2 25 22 - 32 mmol/L   Glucose, Bld 111 (H) 70 - 99 mg/dL   BUN 16 8 - 23 mg/dL   Creatinine, Ser 1.09 0.61 - 1.24 mg/dL   Calcium 8.7 (L) 8.9 - 10.3 mg/dL   GFR calc non Af Amer >60 >60 mL/min   GFR calc Af Amer >60 >60 mL/min   Anion gap 11 5 - 15    Comment: Performed at [facility], [place] [address], [place], [place] [address]  Lipid panel     Status: Abnormal   Collection Time: 02/03/18  5:00 AM  Result Value Ref Range   Cholesterol 194 0 - 200 mg/dL   Triglycerides 127 <150 mg/dL   HDL 59 >40 mg/dL   Total CHOL/HDL Ratio 3.3 RATIO   VLDL 25 0 - 40 mg/dL   LDL Cholesterol 110 (H) 0 - 99 mg/dL    Comment:        Total Cholesterol/HDL:CHD Risk Coronary Heart Disease Risk Table                     Men   Women  1/2 Average Risk   3.4   3.3  Average Risk  5.0   4.4  2 X Average Risk   9.6   7.1  3 X Average Risk  23.4   11.0        Use the calculated Patient Ratio above and the  CHD Risk Table to determine the patient's CHD Risk.        ATP III CLASSIFICATION (LDL):  <100     mg/dL   Optimal  100-129  mg/dL   Near or Above                    Optimal  130-159  mg/dL   Borderline  160-189  mg/dL   High  >190     mg/dL   Very High Performed at [facility] [address], [place], [place] [address]   Heparin level (unfractionated)     Status: Abnormal   Collection Time: 02/03/18  5:00 AM  Result Value Ref Range   Heparin Unfractionated 0.19 (L) 0.30 - 0.70 IU/mL    Comment: (NOTE) If heparin results are below expected values, and patient dosage has  been confirmed, suggest follow up testing of antithrombin III levels. Performed at [facility], [place] [address], [place], [place] [address]   Heparin level (unfractionated)     Status: None   Collection Time: 02/03/18  2:15 PM  Result Value Ref Range   Heparin Unfractionated 0.44 0.30 - 0.70 IU/mL    Comment: (NOTE) If heparin results are below expected values, and patient dosage has  been confirmed, suggest follow up testing of antithrombin III levels. Performed at [facility], [place] [address] [place] [address]   CBC     Status: None   Collection Time: 02/04/18  4:14 AM  Result Value Ref Range   WBC 7.5 4.0 - 10.5 K/uL   RBC 4.53 4.22 - 5.81 MIL/uL   Hemoglobin 14.5 13.0 - 17.0 g/dL   HCT 42.6 39.0 - 52.0 %   MCV 94.0 80.0 - 100.0 fL   MCH 32.0 26.0 - 34.0 pg   MCHC 34.0 30.0 - 36.0 g/dL   RDW 14.8 11.5 - 15.5 %   Platelets 180 150 - 400 K/uL   nRBC 0.0 0.0 - 0.2 %    Comment: Performed at [facility], [place] [address], [place], [place] [address]  Basic metabolic panel     Status: Abnormal   Collection Time: 02/04/18  4:14 AM  Result Value Ref Range   Sodium 139 135 - 145 mmol/L   Potassium 3.6 3.5 - 5.1 mmol/L   Chloride 103 98 - 111 mmol/L   CO2 26 22 - 32 mmol/L   Glucose, Bld 107 (H) 70 - 99 mg/dL   BUN 17 8 - 23 mg/dL   Creatinine, Ser 1.11 0.61 - 1.24  mg/dL   Calcium 8.7 (L) 8.9 - 10.3 mg/dL   GFR calc non Af Amer >60 >60 mL/min   GFR calc Af Amer >60 >60 mL/min   Anion gap 10 5 - 15    Comment: Performed at [facility] [address], [place], [place] [address]  Heparin level (unfractionated)     Status: None   Collection Time: 02/04/18  4:14 AM  Result Value Ref Range   Heparin Unfractionated 0.44 0.30 - 0.70 IU/mL    Comment: (NOTE) If heparin results are below expected values, and patient dosage has  been confirmed, suggest follow up testing of antithrombin III levels. Performed  at [facility], [place] [address] [place], [place] [address]     ECG   N/A  Telemetry   Sinus rhythm - Personally Reviewed  Radiology    Vas US Doppler Pre Cabg  Result Date: 02/02/2018 PREOPERATIVE VASCULAR EVALUATION  Indications:      Pre CABG evaluation. Risk Factors:     Diabetes. Comparison Study: No comparison study available Performing Technologist: Rudell Cobb Supporting Technologist: Maudry Mayhew RDMS, RVT, RDCS  Examination Guidelines: A complete evaluation includes B-mode imaging, spectral Doppler, color Doppler, and power Doppler as needed of all accessible portions of each vessel. Bilateral testing is considered an integral part of a complete examination. Limited examinations for reoccurring indications may be performed as noted.  Right Carotid Findings: +----------+--------+--------+--------+---------------------+--------+           PSV cm/sEDV cm/sStenosisDescribe             Comments +----------+--------+--------+--------+---------------------+--------+ CCA Prox  51      12                                            +----------+--------+--------+--------+---------------------+--------+ CCA Distal75      22              hypoechoic                    +----------+--------+--------+--------+---------------------+--------+ ICA Prox  88      26      1-39%   focal and hyperechoic          +----------+--------+--------+--------+---------------------+--------+ ICA Mid   107     27                                            +----------+--------+--------+--------+---------------------+--------+ ICA Distal84      18                                            +----------+--------+--------+--------+---------------------+--------+ ECA       122     13                                            +----------+--------+--------+--------+---------------------+--------+ Portions of this table do not appear on this page. +----------+--------+-------+--------+------------+           PSV cm/sEDV cmsDescribeArm Pressure +----------+--------+-------+--------+------------+ [id number]                                  +----------+--------+-------+--------+------------+ +---------+--------+--+--------+--+---------+ VertebralPSV cm/s34EDV cm/s15Antegrade +---------+--------+--+--------+--+---------+ Left Carotid Findings: +----------+--------+--------+--------+------------------+--------+           PSV cm/sEDV cm/sStenosisDescribe          Comments +----------+--------+--------+--------+------------------+--------+ CCA Prox  73      21                                         +----------+--------+--------+--------+------------------+--------+ CCA Distal83  20              focal and calcific         +----------+--------+--------+--------+------------------+--------+ ICA Prox  108     28      1-39%   focal and calcific         +----------+--------+--------+--------+------------------+--------+ ICA Distal90      28                                         +----------+--------+--------+--------+------------------+--------+ ECA       85                                                 +----------+--------+--------+--------+------------------+--------+ +----------+--------+--------+--------+------------+ SubclavianPSV cm/sEDV cm/sDescribeArm  Pressure +----------+--------+--------+--------+------------+           174                                  +----------+--------+--------+--------+------------+ +---------+--------+--+--------+--+---------+ VertebralPSV cm/s76EDV cm/s22Antegrade +---------+--------+--+--------+--+---------+  ABI Findings: +--------+------------------+-----+---------+----------------------------------+ Right   Rt Pressure (mmHg)IndexWaveform Comment                            +--------+------------------+-----+---------+----------------------------------+ Brachial                       biphasic Unable to obtain due to                                                    catheterization on site            +--------+------------------+-----+---------+----------------------------------+ PTA                            triphasic                                   +--------+------------------+-----+---------+----------------------------------+ DP                             triphasic                                   +--------+------------------+-----+---------+----------------------------------+ +--------+------------------+-----+---------+-------+ Left    Lt Pressure (mmHg)IndexWaveform Comment +--------+------------------+-----+---------+-------+ Brachial                       triphasic133     +--------+------------------+-----+---------+-------+ PTA                            triphasic        +--------+------------------+-----+---------+-------+ DP                             triphasic        +--------+------------------+-----+---------+-------+  Right Doppler Findings: +--------+--------+-----+---------+-------------------------------------------+  Site    PressureIndexDoppler  Comments                                    +--------+--------+-----+---------+-------------------------------------------+ Brachial             biphasic Unable to obtain due to  catheterization on                                site                                        +--------+--------+-----+---------+-------------------------------------------+ Radial               triphasic                                            +--------+--------+-----+---------+-------------------------------------------+ Ulnar                triphasic                                            +--------+--------+-----+---------+-------------------------------------------+  Left Doppler Findings: +--------+--------+-----+---------+--------+ Site    PressureIndexDoppler  Comments +--------+--------+-----+---------+--------+ Brachial             triphasic133      +--------+--------+-----+---------+--------+ Radial               triphasic         +--------+--------+-----+---------+--------+ Ulnar                triphasic         +--------+--------+-----+---------+--------+  Summary: Right Carotid: Velocities in the right ICA are consistent with a 1-39% stenosis. Left Carotid: Velocities in the left ICA are consistent with a 1-39% stenosis. Vertebrals: Bilateral vertebral arteries demonstrate antegrade flow. Right Upper Extremity: Doppler waveforms remain within normal limits with right radial compression. Doppler waveforms remain within normal limits with right ulnar compression. Left Upper Extremity: Doppler waveform obliterate with left radial compression. Doppler waveforms remain within normal limits with left ulnar compression.     Preliminary     Cardiac Studies   LV EF: 30% -   35%  ------------------------------------------------------------------- History:   Risk factors:  Hypertension.  ------------------------------------------------------------------- Study Conclusions  - Left ventricle: The cavity size was normal. Wall thickness was   increased in a pattern of moderate LVH. Basal to mid inferoseptal   and anteroseptal akinesis. Apical lateral  akinesis. Akinesis of   the true apex. Apical anterior akinesis. Apical inferior   akinesis. Systolic function was moderately to severely reduced.   The estimated ejection fraction was in the range of 30% to 35%.   Doppler parameters are consistent with abnormal left ventricular   relaxation (grade 1 diastolic dysfunction). - Aortic valve: There was no stenosis. There was trivial   regurgitation. - Mitral valve: Mildly calcified annulus. Mildly calcified leaflets   . There was no significant regurgitation. - Right ventricle: The cavity size was normal. Systolic function   was normal. - Tricuspid valve: Peak RV-RA gradient (S): 37 mm Hg. - Pulmonary arteries: PA peak pressure:  40 mm Hg (S). - Inferior vena cava: The vessel was normal in size. The   respirophasic diameter changes were in the normal range (>= 50%),   consistent with normal central venous pressure.  Impressions:  - Normal LV size with moderate LV hypertrophy. EF 30-35%, wall   motion abnormalities in the pattern of a LAD infarct. Normal RV   size and systolic function. No significant valvular   abnormalities. Mild pulmonary hypertension.  Assessment   Principal Problem:   NSTEMI (non-ST elevated myocardial infarction) ([place]) Active Problems:   Essential hypertension   Dyslipidemia, goal LDL below 70   Acute systolic heart failure ([place])   Plan   1. Creatinine stable on BID lasix - plan for CABG tomorrow. Continue diuresis today - was almost 1L negative yesterday.  Time Spent Directly with Patient:  I have spent a total of 25 minutes with the patient reviewing hospital notes, telemetry, EKGs, labs and examining the patient as well as establishing an assessment and plan that was discussed personally with the patient.  > 50% of time was spent in direct patient care.  Length of Stay:  LOS: 2 days   Pixie Casino, MD, [facility], [place] Director of the Advanced Lipid Disorders  &  Cardiovascular Risk Reduction Clinic Diplomate of the American Board of Clinical Lipidology Attending Cardiologist  Direct Dial: [phone]  Fax: [phone]  Website:  [email or web address]  02/04/2018, 9:31 AM

## 2018-02-04 NOTE — Progress Notes (Signed)
2 Days Post-Op Procedure(s) (LRB): LEFT HEART CATH AND CORONARY ANGIOGRAPHY (N/A) Subjective: No chest pain or shortness of breath ambulating in halls.  Objective: Vital signs in last 24 hours: Temp:  [97.8 F (36.6 C)-98.3 F (36.8 C)] 97.8 F (36.6 C) (01/19 1440) Pulse Rate:  [70-81] 81 (01/19 1440) Cardiac Rhythm: Normal sinus rhythm;Heart block (01/19 1000) Resp:  [18] 18 (01/19 0628) BP: (107-132)/(68-77) 120/77 (01/19 1440) SpO2:  [94 %-97 %] 97 % (01/19 1440) Weight:  [76.8 kg] 76.8 kg (01/19 0628)  Hemodynamic parameters for last 24 hours:    Intake/Output from previous day: 01/18 0701 - 01/19 0700 In: 345 [P.O.:220; I.V.:125] Out: 1050 [Urine:1050] Intake/Output this shift: No intake/output data recorded.  General appearance: alert and cooperative Heart: regular rate and rhythm, S1, S2 normal, no murmur, click, rub or gallop Lungs: clear to auscultation bilaterally  Lab Results: Recent Labs    02/03/18 0500 02/04/18 0414  WBC 8.6 7.5  HGB 14.5 14.5  HCT 42.7 42.6  PLT 188 180   BMET:  Recent Labs    02/03/18 0500 02/04/18 0414  NA 138 139  K 3.6 3.6  CL 102 103  CO2 25 26  GLUCOSE 111* 107*  BUN 16 17  CREATININE 1.09 1.11  CALCIUM 8.7* 8.7*    PT/INR:  Recent Labs    02/04/18 1222  LABPROT 13.8  INR 1.07   ABG    Component Value Date/Time   PHART 7.466 (H) 02/04/2018 1240   HCO3 26.6 02/04/2018 1240   O2SAT 94.0 02/04/2018 1240   CBG (last 3)  No results for input(s): GLUCAP in the last 72 hours.  Assessment/Plan: S/P Procedure(s) (LRB): LEFT HEART CATH AND CORONARY ANGIOGRAPHY (N/A)  Severe multi-vessel CAD with NSTEMI and severe LV systolic dysfunction. Plan CABG in am. Patient has no further questions.   LOS: 2 days    Gaye Pollack 02/04/2018

## 2018-02-04 NOTE — Progress Notes (Signed)
[place] for Heparin Indication: chest pain/ACS, CABG monday  Heparin Dosing Weight: 81.6 kg  Labs: Recent Labs    02/02/18 0342 02/02/18 1416 02/02/18 2100 02/03/18 0500 02/03/18 1415 02/04/18 0414  HGB 15.2  --   --  14.5  --  14.5  HCT 46.3  --   --  42.7  --  42.6  PLT 231  --   --  188  --  180  HEPARINUNFRC  --   --   --  0.19* 0.44 0.44  CREATININE 1.13  --   --  1.09  --  1.11  TROPONINI  --  4.38* 3.15*  --   --   --     Assessment: 66 yom presenting with CP, elevated troponin. Pharmacy consulted to dose heparin for ACS. Not on anticoagulation PTA. CBC wnl. No active bleed issues documented but does have a history of nosebleeds documented that required ER visit.  Heparin remains therapeutic today. CBC stable, planning for CABG tomorrow.   Goal of Therapy:  Heparin level 0.3-0.7 units/ml Monitor platelets by anticoagulation protocol: Yes   Plan:  Continue heparin 1150 units/hr Daily heparin level and CBC CABG tomorrow  Arrie Senate, PharmD, BCPS Clinical Pharmacist Please check AMION for all [facility] numbers 02/04/2018

## 2018-02-04 NOTE — Anesthesia Preprocedure Evaluation (Addendum)
Anesthesia Evaluation  Patient identified by MRN, date of birth, ID band Patient awake    Reviewed: Allergy & Precautions, NPO status , Patient's Chart, lab work & pertinent test results  Airway Mallampati: II  TM Distance: >3 FB Neck ROM: Full    Dental  (+) Dental Advisory Given   Pulmonary former smoker,    breath sounds clear to auscultation       Cardiovascular hypertension, Pt. on medications + Past MI   Rhythm:Regular Rate:Normal  Impressions:  - Normal LV size with moderate LV hypertrophy. EF 30-35%, wall motion abnormalities in the pattern of a LAD infarct. Normal RV size and systolic function. No significant valvular abnormalities. Mild pulmonary hypertension.   Neuro/Psych negative neurological ROS     GI/Hepatic Neg liver ROS, GERD  ,  Endo/Other  negative endocrine ROS  Renal/GU negative Renal ROS     Musculoskeletal   Abdominal   Peds  Hematology negative hematology ROS (+)   Anesthesia Other Findings   Reproductive/Obstetrics                            Lab Results  Component Value Date   WBC 7.6 02/05/2018   HGB 14.5 02/05/2018   HCT 43.7 02/05/2018   MCV 94.0 02/05/2018   PLT 171 02/05/2018   Lab Results  Component Value Date   CREATININE 1.23 02/05/2018   BUN 22 02/05/2018   NA 138 02/05/2018   K 3.8 02/05/2018   CL 102 02/05/2018   CO2 25 02/05/2018    Anesthesia Physical Anesthesia Plan  ASA: IV  Anesthesia Plan: General   Post-op Pain Management:    Induction: Intravenous  PONV Risk Score and Plan: 2 and Dexamethasone, Ondansetron and Treatment may vary due to age or medical condition  Airway Management Planned: Oral ETT  Additional Equipment: Arterial line, CVP, PA Cath, TEE and Ultrasound Guidance Line Placement  Intra-op Plan:   Post-operative Plan: Post-operative intubation/ventilation  Informed Consent: I have reviewed the patients  History and Physical, chart, labs and discussed the procedure including the risks, benefits and alternatives for the proposed anesthesia with the patient or authorized representative who has indicated his/her understanding and acceptance.     Dental advisory given  Plan Discussed with: CRNA  Anesthesia Plan Comments:        Anesthesia Quick Evaluation

## 2018-02-05 ENCOUNTER — Inpatient Hospital Stay (HOSPITAL_COMMUNITY): Payer: Medicare Other | Admitting: Certified Registered Nurse Anesthetist

## 2018-02-05 ENCOUNTER — Other Ambulatory Visit: Payer: Self-pay

## 2018-02-05 ENCOUNTER — Inpatient Hospital Stay (HOSPITAL_COMMUNITY)
Admission: EM | Disposition: A | Discharge status: Home / Self Care | Payer: Self-pay | Source: Home / Self Care | Attending: Surgery

## 2018-02-05 ENCOUNTER — Encounter (HOSPITAL_COMMUNITY): Payer: Self-pay | Admitting: Registered Nurse

## 2018-02-05 ENCOUNTER — Inpatient Hospital Stay (HOSPITAL_COMMUNITY)
Admit: 2018-02-05 | Discharge: 2018-02-05 | Disposition: A | Discharge status: Home / Self Care | Payer: Medicare Other | Attending: Surgery | Admitting: Surgery

## 2018-02-05 ENCOUNTER — Inpatient Hospital Stay (HOSPITAL_COMMUNITY): Payer: Medicare Other

## 2018-02-05 DIAGNOSIS — Z951 Presence of aortocoronary bypass graft: Secondary | ICD-10-CM

## 2018-02-05 HISTORY — PX: TEE WITHOUT CARDIOVERSION: SHX5443

## 2018-02-05 HISTORY — PX: CORONARY ARTERY BYPASS GRAFT: SHX141

## 2018-02-05 LAB — BASIC METABOLIC PANEL
Anion gap: 11 (ref 5–15)
BUN: 22 mg/dL (ref 8–23)
CO2: 25 mmol/L (ref 22–32)
CREATININE: 1.23 mg/dL (ref 0.61–1.24)
Calcium: 8.7 mg/dL — ABNORMAL LOW (ref 8.9–10.3)
Chloride: 102 mmol/L (ref 98–111)
GFR calc Af Amer: >60 mL/min (ref >60)
GFR calc non Af Amer: 56 mL/min — ABNORMAL LOW (ref >60)
Glucose, Bld: 102 mg/dL — ABNORMAL HIGH (ref 70–99)
Potassium: 3.8 mmol/L (ref 3.5–5.1)
Sodium: 138 mmol/L (ref 135–145)

## 2018-02-05 LAB — POCT I-STAT, CHEM 8
BUN: 21 mg/dL (ref 8–23)
BUN: 18 mg/dL (ref 8–23)
BUN: 20 mg/dL (ref 8–23)
BUN: 19 mg/dL (ref 8–23)
BUN: 18 mg/dL (ref 8–23)
BUN: 21 mg/dL (ref 8–23)
CHLORIDE: 102 mmol/L (ref 98–111)
Calcium, Ion: 1.16 mmol/L (ref 1.15–1.40)
Calcium, Ion: 1.01 mmol/L — ABNORMAL LOW (ref 1.15–1.40)
Calcium, Ion: 1.18 mmol/L (ref 1.15–1.40)
Calcium, Ion: 0.99 mmol/L — ABNORMAL LOW (ref 1.15–1.40)
Calcium, Ion: 1.01 mmol/L — ABNORMAL LOW (ref 1.15–1.40)
Calcium, Ion: 1.06 mmol/L — ABNORMAL LOW (ref 1.15–1.40)
Chloride: 102 mmol/L (ref 98–111)
Chloride: 104 mmol/L (ref 98–111)
Chloride: 101 mmol/L (ref 98–111)
Chloride: 103 mmol/L (ref 98–111)
Chloride: 108 mmol/L (ref 98–111)
Creatinine, Ser: 1.1 mg/dL (ref 0.61–1.24)
Creatinine, Ser: 0.9 mg/dL (ref 0.61–1.24)
Creatinine, Ser: 1 mg/dL (ref 0.61–1.24)
Creatinine, Ser: 0.9 mg/dL (ref 0.61–1.24)
Creatinine, Ser: 1 mg/dL (ref 0.61–1.24)
Creatinine, Ser: 0.9 mg/dL (ref 0.61–1.24)
Glucose, Bld: 105 mg/dL — ABNORMAL HIGH (ref 70–99)
Glucose, Bld: 112 mg/dL — ABNORMAL HIGH (ref 70–99)
Glucose, Bld: 116 mg/dL — ABNORMAL HIGH (ref 70–99)
Glucose, Bld: 140 mg/dL — ABNORMAL HIGH (ref 70–99)
Glucose, Bld: 149 mg/dL — ABNORMAL HIGH (ref 70–99)
Glucose, Bld: 115 mg/dL — ABNORMAL HIGH (ref 70–99)
HCT: 40 % (ref 39.0–52.0)
HCT: 33 % — ABNORMAL LOW (ref 39.0–52.0)
HCT: 41 % (ref 39.0–52.0)
HCT: 29 % — ABNORMAL LOW (ref 39.0–52.0)
HCT: 37 % — ABNORMAL LOW (ref 39.0–52.0)
HEMATOCRIT: 31 % — AB (ref 39.0–52.0)
HEMOGLOBIN: 13.6 g/dL (ref 13.0–17.0)
HEMOGLOBIN: 12.6 g/dL — AB (ref 13.0–17.0)
Hemoglobin: 11.2 g/dL — ABNORMAL LOW (ref 13.0–17.0)
Hemoglobin: 13.9 g/dL (ref 13.0–17.0)
Hemoglobin: 9.9 g/dL — ABNORMAL LOW (ref 13.0–17.0)
Hemoglobin: 10.5 g/dL — ABNORMAL LOW (ref 13.0–17.0)
Potassium: 4.1 mmol/L (ref 3.5–5.1)
Potassium: 4.1 mmol/L (ref 3.5–5.1)
Potassium: 5.7 mmol/L — ABNORMAL HIGH (ref 3.5–5.1)
Potassium: 4.4 mmol/L (ref 3.5–5.1)
Potassium: 3.9 mmol/L (ref 3.5–5.1)
Potassium: 5.1 mmol/L (ref 3.5–5.1)
SODIUM: 137 mmol/L (ref 135–145)
SODIUM: 135 mmol/L (ref 135–145)
Sodium: 138 mmol/L (ref 135–145)
Sodium: 139 mmol/L (ref 135–145)
Sodium: 139 mmol/L (ref 135–145)
Sodium: 140 mmol/L (ref 135–145)
TCO2: 31 mmol/L (ref 22–32)
TCO2: 28 mmol/L (ref 22–32)
TCO2: 31 mmol/L (ref 22–32)
TCO2: 32 mmol/L (ref 22–32)
TCO2: 26 mmol/L (ref 22–32)
TCO2: 25 mmol/L (ref 22–32)

## 2018-02-05 LAB — CBC
HCT: 43.7 % (ref 39.0–52.0)
HCT: 38.2 % — ABNORMAL LOW (ref 39.0–52.0)
HEMATOCRIT: 41.3 % (ref 39.0–52.0)
Hemoglobin: 14.5 g/dL (ref 13.0–17.0)
Hemoglobin: 13.8 g/dL (ref 13.0–17.0)
Hemoglobin: 12.6 g/dL — ABNORMAL LOW (ref 13.0–17.0)
MCH: 31.2 pg (ref 26.0–34.0)
MCH: 31.5 pg (ref 26.0–34.0)
MCH: 31.5 pg (ref 26.0–34.0)
MCHC: 33.2 g/dL (ref 30.0–36.0)
MCHC: 33.4 g/dL (ref 30.0–36.0)
MCHC: 33 g/dL (ref 30.0–36.0)
MCV: 94 fL (ref 80.0–100.0)
MCV: 94.3 fL (ref 80.0–100.0)
MCV: 95.5 fL (ref 80.0–100.0)
PLATELETS: 171 10*3/uL (ref 150–400)
Platelets: 114 10*3/uL — ABNORMAL LOW (ref 150–400)
Platelets: 160 10*3/uL (ref 150–400)
RBC: 4.65 MIL/uL (ref 4.22–5.81)
RBC: 4.38 MIL/uL (ref 4.22–5.81)
RBC: 4 MIL/uL — AB (ref 4.22–5.81)
RDW: 14.6 % (ref 11.5–15.5)
RDW: 14.3 % (ref 11.5–15.5)
RDW: 14.4 % (ref 11.5–15.5)
WBC: 7.6 10*3/uL (ref 4.0–10.5)
WBC: 14.1 10*3/uL — ABNORMAL HIGH (ref 4.0–10.5)
WBC: 14.5 10*3/uL — ABNORMAL HIGH (ref 4.0–10.5)
nRBC: 0 % (ref 0.0–0.2)
nRBC: 0 % (ref 0.0–0.2)
nRBC: 0 % (ref 0.0–0.2)

## 2018-02-05 LAB — POCT I-STAT 3, ART BLOOD GAS (G3+)
Acid-Base Excess: 5 mmol/L — ABNORMAL HIGH (ref 0.0–2.0)
Acid-Base Excess: 1 mmol/L (ref 0.0–2.0)
Acid-Base Excess: 1 mmol/L (ref 0.0–2.0)
Acid-base deficit: 2 mmol/L (ref 0.0–2.0)
Acid-base deficit: 3 mmol/L — ABNORMAL HIGH (ref 0.0–2.0)
Bicarbonate: 29.6 mmol/L — ABNORMAL HIGH (ref 20.0–28.0)
Bicarbonate: 25.5 mmol/L (ref 20.0–28.0)
Bicarbonate: 23.9 mmol/L (ref 20.0–28.0)
Bicarbonate: 25.6 mmol/L (ref 20.0–28.0)
Bicarbonate: 22.3 mmol/L (ref 20.0–28.0)
O2 SAT: 100 %
O2 Saturation: 99 %
O2 Saturation: 98 %
O2 Saturation: 97 %
O2 Saturation: 95 %
PCO2 ART: 40 mmHg (ref 32.0–48.0)
PO2 ART: 123 mmHg — AB (ref 83.0–108.0)
Patient temperature: 36.6
TCO2: 31 mmol/L (ref 22–32)
TCO2: 27 mmol/L (ref 22–32)
TCO2: 25 mmol/L (ref 22–32)
TCO2: 27 mmol/L (ref 22–32)
TCO2: 24 mmol/L (ref 22–32)
pCO2 arterial: 41.9 mmHg (ref 32.0–48.0)
pCO2 arterial: 40.2 mmHg (ref 32.0–48.0)
pCO2 arterial: 43.9 mmHg (ref 32.0–48.0)
pCO2 arterial: 40.2 mmHg (ref 32.0–48.0)
pH, Arterial: 7.456 — ABNORMAL HIGH (ref 7.350–7.450)
pH, Arterial: 7.411 (ref 7.350–7.450)
pH, Arterial: 7.343 — ABNORMAL LOW (ref 7.350–7.450)
pH, Arterial: 7.41 (ref 7.350–7.450)
pH, Arterial: 7.354 (ref 7.350–7.450)
pO2, Arterial: 411 mmHg — ABNORMAL HIGH (ref 83.0–108.0)
pO2, Arterial: 112 mmHg — ABNORMAL HIGH (ref 83.0–108.0)
pO2, Arterial: 87 mmHg (ref 83.0–108.0)
pO2, Arterial: 80 mmHg — ABNORMAL LOW (ref 83.0–108.0)

## 2018-02-05 LAB — CREATININE, SERUM
Creatinine, Ser: 1.08 mg/dL (ref 0.61–1.24)
GFR calc Af Amer: >60 mL/min (ref >60)
GFR calc non Af Amer: >60 mL/min (ref >60)

## 2018-02-05 LAB — PROTIME-INR
INR: 1.44
Prothrombin Time: 17.4 seconds — ABNORMAL HIGH (ref 11.4–15.2)

## 2018-02-05 LAB — HEMOGLOBIN AND HEMATOCRIT, BLOOD
HEMATOCRIT: 30.7 % — AB (ref 39.0–52.0)
HEMOGLOBIN: 10.3 g/dL — AB (ref 13.0–17.0)

## 2018-02-05 LAB — GLUCOSE, CAPILLARY
GLUCOSE-CAPILLARY: 121 mg/dL — AB (ref 70–99)
GLUCOSE-CAPILLARY: 126 mg/dL — AB (ref 70–99)
GLUCOSE-CAPILLARY: 124 mg/dL — AB (ref 70–99)
Glucose-Capillary: 120 mg/dL — ABNORMAL HIGH (ref 70–99)
Glucose-Capillary: 105 mg/dL — ABNORMAL HIGH (ref 70–99)
Glucose-Capillary: 118 mg/dL — ABNORMAL HIGH (ref 70–99)
Glucose-Capillary: 114 mg/dL — ABNORMAL HIGH (ref 70–99)
Glucose-Capillary: 104 mg/dL — ABNORMAL HIGH (ref 70–99)

## 2018-02-05 LAB — POCT I-STAT 4, (NA,K, GLUC, HGB,HCT)
Glucose, Bld: 141 mg/dL — ABNORMAL HIGH (ref 70–99)
HEMATOCRIT: 40 % (ref 39.0–52.0)
Hemoglobin: 13.6 g/dL (ref 13.0–17.0)
Potassium: 3.7 mmol/L (ref 3.5–5.1)
Sodium: 142 mmol/L (ref 135–145)

## 2018-02-05 LAB — MAGNESIUM: Magnesium: 3.5 mg/dL — ABNORMAL HIGH (ref 1.7–2.4)

## 2018-02-05 LAB — PLATELET COUNT: PLATELETS: 116 10*3/uL — AB (ref 150–400)

## 2018-02-05 LAB — APTT: APTT: 34 s (ref 24–36)

## 2018-02-05 LAB — HEPARIN LEVEL (UNFRACTIONATED): Heparin Unfractionated: 0.35 IU/mL (ref 0.30–0.70)

## 2018-02-05 SURGERY — CORONARY ARTERY BYPASS GRAFTING (CABG)
Anesthesia: General | Site: Chest

## 2018-02-05 MED ORDER — FENTANYL CITRATE (PF) 250 MCG/5ML IJ SOLN
INTRAMUSCULAR | Status: AC
  Filled 2018-02-05: qty 5

## 2018-02-05 MED ORDER — THROMBIN (RECOMBINANT) 20000 UNITS EX SOLR
CUTANEOUS | Status: AC
  Filled 2018-02-05: qty 20000

## 2018-02-05 MED ORDER — HEPARIN SODIUM (PORCINE) 1000 UNIT/ML IJ SOLN
INTRAMUSCULAR | Status: AC
  Filled 2018-02-05: qty 2

## 2018-02-05 MED ORDER — INSULIN REGULAR BOLUS VIA INFUSION
0.0000 [IU] | Freq: Three times a day (TID) | INTRAVENOUS | Status: DC
  Filled 2018-02-05: qty 10

## 2018-02-05 MED ORDER — SODIUM CHLORIDE 0.45 % IV SOLN: INTRAVENOUS | Status: DC | PRN

## 2018-02-05 MED ORDER — PROPOFOL 10 MG/ML IV BOLUS
INTRAVENOUS | Status: DC | PRN
  Administered 2018-02-05: 20 mg via INTRAVENOUS

## 2018-02-05 MED ORDER — HEMOSTATIC AGENTS (NO CHARGE) OPTIME
TOPICAL | Status: DC | PRN
  Administered 2018-02-05: 1 via TOPICAL

## 2018-02-05 MED ORDER — VANCOMYCIN HCL IN DEXTROSE 1-5 GM/200ML-% IV SOLN
1000.0000 mg | Freq: Once | INTRAVENOUS | Status: AC
  Administered 2018-02-05: 1000 mg via INTRAVENOUS
  Filled 2018-02-05: qty 200

## 2018-02-05 MED ORDER — SODIUM CHLORIDE 0.9 % IV SOLN
INTRAVENOUS | Status: DC | PRN
  Administered 2018-02-05: 30 ug/min via INTRAVENOUS

## 2018-02-05 MED ORDER — PHENYLEPHRINE HCL 10 MG/ML IJ SOLN
INTRAMUSCULAR | Status: DC | PRN
  Administered 2018-02-05 (×2): 80 ug via INTRAVENOUS
  Administered 2018-02-05: 40 ug via INTRAVENOUS
  Administered 2018-02-05 (×2): 80 ug via INTRAVENOUS

## 2018-02-05 MED ORDER — PROTAMINE SULFATE 10 MG/ML IV SOLN
INTRAVENOUS | Status: DC | PRN
  Administered 2018-02-05: 30 mg via INTRAVENOUS
  Administered 2018-02-05 (×3): 100 mg via INTRAVENOUS
  Administered 2018-02-05: 20 mg via INTRAVENOUS

## 2018-02-05 MED ORDER — ASPIRIN EC 325 MG PO TBEC
325.0000 mg | DELAYED_RELEASE_TABLET | Freq: Every day | ORAL | Status: DC
  Administered 2018-02-06 – 2018-02-07 (×2): 325 mg via ORAL
  Filled 2018-02-05 (×2): qty 1

## 2018-02-05 MED ORDER — POTASSIUM CHLORIDE 10 MEQ/50ML IV SOLN
10.0000 meq | INTRAVENOUS | Status: AC
  Administered 2018-02-05 (×3): 10 meq via INTRAVENOUS

## 2018-02-05 MED ORDER — SODIUM CHLORIDE 0.9 % IV SOLN
INTRAVENOUS | Status: DC
  Administered 2018-02-05: 15:00:00 via INTRAVENOUS

## 2018-02-05 MED ORDER — NITROGLYCERIN IN D5W 200-5 MCG/ML-% IV SOLN: 0.0000 ug/min | INTRAVENOUS | Status: DC

## 2018-02-05 MED ORDER — SODIUM CHLORIDE 0.9 % IV SOLN
1.5000 g | Freq: Two times a day (BID) | INTRAVENOUS | Status: AC
  Administered 2018-02-05 – 2018-02-07 (×4): 1.5 g via INTRAVENOUS
  Filled 2018-02-05 (×4): qty 1.5

## 2018-02-05 MED ORDER — ALBUMIN HUMAN 5 % IV SOLN
INTRAVENOUS | Status: DC | PRN
  Administered 2018-02-05: 13:00:00 via INTRAVENOUS

## 2018-02-05 MED ORDER — METOPROLOL TARTRATE 25 MG/10 ML ORAL SUSPENSION: 12.5000 mg | Freq: Two times a day (BID) | ORAL | Status: DC

## 2018-02-05 MED ORDER — ONDANSETRON HCL 4 MG/2ML IJ SOLN
INTRAMUSCULAR | Status: DC | PRN
  Administered 2018-02-05: 4 mg via INTRAVENOUS

## 2018-02-05 MED ORDER — DEXAMETHASONE SODIUM PHOSPHATE 10 MG/ML IJ SOLN
INTRAMUSCULAR | Status: DC | PRN
  Administered 2018-02-05: 4 mg via INTRAVENOUS

## 2018-02-05 MED ORDER — PLASMA-LYTE 148 IV SOLN
INTRAVENOUS | Status: DC | PRN
  Administered 2018-02-05: 500 mL via INTRAVASCULAR

## 2018-02-05 MED ORDER — BISACODYL 10 MG RE SUPP: 10.0000 mg | Freq: Every day | RECTAL | Status: DC

## 2018-02-05 MED ORDER — VECURONIUM BROMIDE 10 MG IV SOLR
INTRAVENOUS | Status: DC | PRN
  Administered 2018-02-05 (×4): 5 mg via INTRAVENOUS

## 2018-02-05 MED ORDER — ACETAMINOPHEN 650 MG RE SUPP
650.0000 mg | Freq: Once | RECTAL | Status: AC
  Administered 2018-02-05: 650 mg via RECTAL

## 2018-02-05 MED ORDER — SODIUM CHLORIDE 0.9% FLUSH
3.0000 mL | Freq: Two times a day (BID) | INTRAVENOUS | Status: DC
  Administered 2018-02-06: 10 mL via INTRAVENOUS
  Administered 2018-02-06 – 2018-02-07 (×3): 3 mL via INTRAVENOUS

## 2018-02-05 MED ORDER — ROCURONIUM BROMIDE 10 MG/ML (PF) SYRINGE
PREFILLED_SYRINGE | INTRAVENOUS | Status: DC | PRN
  Administered 2018-02-05 (×3): 50 mg via INTRAVENOUS

## 2018-02-05 MED ORDER — FAMOTIDINE IN NACL 20-0.9 MG/50ML-% IV SOLN
20.0000 mg | Freq: Two times a day (BID) | INTRAVENOUS | Status: AC
  Administered 2018-02-05 (×2): 20 mg via INTRAVENOUS
  Filled 2018-02-05: qty 50

## 2018-02-05 MED ORDER — DOCUSATE SODIUM 100 MG PO CAPS
200.0000 mg | ORAL_CAPSULE | Freq: Every day | ORAL | Status: DC
  Administered 2018-02-06 – 2018-02-07 (×2): 200 mg via ORAL
  Filled 2018-02-05 (×2): qty 2

## 2018-02-05 MED ORDER — FENTANYL CITRATE (PF) 250 MCG/5ML IJ SOLN
INTRAMUSCULAR | Status: DC | PRN
  Administered 2018-02-05 (×3): 100 ug via INTRAVENOUS
  Administered 2018-02-05: 225 ug via INTRAVENOUS
  Administered 2018-02-05: 150 ug via INTRAVENOUS
  Administered 2018-02-05 (×3): 100 ug via INTRAVENOUS
  Administered 2018-02-05: 25 ug via INTRAVENOUS
  Administered 2018-02-05: 100 ug via INTRAVENOUS
  Administered 2018-02-05: 250 ug via INTRAVENOUS
  Administered 2018-02-05: 150 ug via INTRAVENOUS
  Administered 2018-02-05: 250 ug via INTRAVENOUS

## 2018-02-05 MED ORDER — LACTATED RINGERS IV SOLN
INTRAVENOUS | Status: DC | PRN
  Administered 2018-02-05: 07:00:00 via INTRAVENOUS

## 2018-02-05 MED ORDER — FENTANYL CITRATE (PF) 250 MCG/5ML IJ SOLN
INTRAMUSCULAR | Status: AC
  Filled 2018-02-05: qty 30

## 2018-02-05 MED ORDER — HEPARIN SODIUM (PORCINE) 1000 UNIT/ML IJ SOLN
INTRAMUSCULAR | Status: DC | PRN
  Administered 2018-02-05: 27000 [IU] via INTRAVENOUS
  Administered 2018-02-05: 15000 [IU] via INTRAVENOUS

## 2018-02-05 MED ORDER — SODIUM CHLORIDE 0.9% IV SOLUTION
Freq: Once | INTRAVENOUS | Status: AC
  Administered 2018-02-05: 17:00:00 via INTRAVENOUS

## 2018-02-05 MED ORDER — SODIUM CHLORIDE 0.9% FLUSH: 3.0000 mL | INTRAVENOUS | Status: DC | PRN

## 2018-02-05 MED ORDER — LIDOCAINE 2% (20 MG/ML) 5 ML SYRINGE
INTRAMUSCULAR | Status: DC | PRN
  Administered 2018-02-05: 100 mg via INTRAVENOUS

## 2018-02-05 MED ORDER — MIDAZOLAM HCL 5 MG/5ML IJ SOLN
INTRAMUSCULAR | Status: DC | PRN
  Administered 2018-02-05 (×2): 1 mg via INTRAVENOUS

## 2018-02-05 MED ORDER — PHENYLEPHRINE HCL-NACL 20-0.9 MG/250ML-% IV SOLN: 0.0000 ug/min | INTRAVENOUS | Status: DC

## 2018-02-05 MED ORDER — INSULIN REGULAR(HUMAN) IN NACL 100-0.9 UT/100ML-% IV SOLN: INTRAVENOUS | Status: DC

## 2018-02-05 MED ORDER — ACETAMINOPHEN 160 MG/5ML PO SOLN: 1000.0000 mg | Freq: Four times a day (QID) | ORAL | Status: DC

## 2018-02-05 MED ORDER — METOPROLOL TARTRATE 5 MG/5ML IV SOLN: 2.5000 mg | INTRAVENOUS | Status: DC | PRN

## 2018-02-05 MED ORDER — PANTOPRAZOLE SODIUM 40 MG PO TBEC
40.0000 mg | DELAYED_RELEASE_TABLET | Freq: Every day | ORAL | Status: DC
  Administered 2018-02-07: 40 mg via ORAL
  Filled 2018-02-05: qty 1

## 2018-02-05 MED ORDER — SODIUM CHLORIDE 0.9 % IV SOLN: 250.0000 mL | INTRAVENOUS | Status: DC

## 2018-02-05 MED ORDER — ALBUMIN HUMAN 5 % IV SOLN
250.0000 mL | INTRAVENOUS | Status: AC | PRN
  Administered 2018-02-05: 12.5 g via INTRAVENOUS

## 2018-02-05 MED ORDER — SUCCINYLCHOLINE CHLORIDE 20 MG/ML IJ SOLN
INTRAMUSCULAR | Status: DC | PRN
  Administered 2018-02-05: 120 mg via INTRAVENOUS

## 2018-02-05 MED ORDER — ONDANSETRON HCL 4 MG/2ML IJ SOLN
4.0000 mg | Freq: Four times a day (QID) | INTRAMUSCULAR | Status: DC | PRN
  Administered 2018-02-05 – 2018-02-06 (×3): 4 mg via INTRAVENOUS
  Filled 2018-02-05 (×3): qty 2

## 2018-02-05 MED ORDER — OXYCODONE HCL 5 MG PO TABS
5.0000 mg | ORAL_TABLET | ORAL | Status: DC | PRN
  Administered 2018-02-07: 10 mg via ORAL
  Filled 2018-02-05 (×2): qty 1

## 2018-02-05 MED ORDER — ACETAMINOPHEN 500 MG PO TABS
1000.0000 mg | ORAL_TABLET | Freq: Four times a day (QID) | ORAL | Status: DC
  Administered 2018-02-06 – 2018-02-08 (×9): 1000 mg via ORAL
  Filled 2018-02-05 (×9): qty 2

## 2018-02-05 MED ORDER — DEXMEDETOMIDINE HCL IN NACL 200 MCG/50ML IV SOLN: 0.0000 ug/kg/h | INTRAVENOUS | Status: DC

## 2018-02-05 MED ORDER — LACTATED RINGERS IV SOLN
INTRAVENOUS | Status: DC | PRN
  Administered 2018-02-05: 08:00:00 via INTRAVENOUS

## 2018-02-05 MED ORDER — ASPIRIN 81 MG PO CHEW: 324.0000 mg | CHEWABLE_TABLET | Freq: Every day | ORAL | Status: DC

## 2018-02-05 MED ORDER — LACTATED RINGERS IV SOLN: INTRAVENOUS | Status: DC

## 2018-02-05 MED ORDER — TRAMADOL HCL 50 MG PO TABS: 50.0000 mg | ORAL_TABLET | ORAL | Status: DC | PRN

## 2018-02-05 MED ORDER — THROMBIN 20000 UNITS EX SOLR
CUTANEOUS | Status: DC | PRN
  Administered 2018-02-05: 20000 [IU] via TOPICAL

## 2018-02-05 MED ORDER — EPHEDRINE SULFATE 50 MG/ML IJ SOLN
INTRAMUSCULAR | Status: DC | PRN
  Administered 2018-02-05 (×2): 5 mg via INTRAVENOUS
  Administered 2018-02-05 (×2): 15 mg via INTRAVENOUS
  Administered 2018-02-05 (×2): 10 mg via INTRAVENOUS

## 2018-02-05 MED ORDER — THROMBIN 20000 UNITS EX SOLR
OROMUCOSAL | Status: DC | PRN
  Administered 2018-02-05: 4 mL via TOPICAL

## 2018-02-05 MED ORDER — PROPOFOL 10 MG/ML IV BOLUS
INTRAVENOUS | Status: AC
  Filled 2018-02-05: qty 20

## 2018-02-05 MED ORDER — CHLORHEXIDINE GLUCONATE 0.12 % MT SOLN
15.0000 mL | OROMUCOSAL | Status: AC
  Administered 2018-02-05: 15 mL via OROMUCOSAL

## 2018-02-05 MED ORDER — MIDAZOLAM HCL (PF) 10 MG/2ML IJ SOLN
INTRAMUSCULAR | Status: AC
  Filled 2018-02-05: qty 2

## 2018-02-05 MED ORDER — ARTIFICIAL TEARS OPHTHALMIC OINT
TOPICAL_OINTMENT | OPHTHALMIC | Status: DC | PRN
  Administered 2018-02-05: 1 via OPHTHALMIC

## 2018-02-05 MED ORDER — LACTATED RINGERS IV SOLN
INTRAVENOUS | Status: DC | PRN
  Administered 2018-02-05 (×2): via INTRAVENOUS

## 2018-02-05 MED ORDER — DOPAMINE-DEXTROSE 3.2-5 MG/ML-% IV SOLN
INTRAVENOUS | Status: DC | PRN
  Administered 2018-02-05: 5 ug/kg/min via INTRAVENOUS

## 2018-02-05 MED ORDER — MORPHINE SULFATE (PF) 2 MG/ML IV SOLN
1.0000 mg | INTRAVENOUS | Status: DC | PRN
  Administered 2018-02-05: 2 mg via INTRAVENOUS
  Administered 2018-02-06: 4 mg via INTRAVENOUS
  Administered 2018-02-06: 2 mg via INTRAVENOUS
  Administered 2018-02-06 (×2): 4 mg via INTRAVENOUS
  Filled 2018-02-05: qty 1
  Filled 2018-02-05 (×2): qty 2
  Filled 2018-02-05: qty 1
  Filled 2018-02-05: qty 2

## 2018-02-05 MED ORDER — MIDAZOLAM HCL 2 MG/2ML IJ SOLN: 2.0000 mg | INTRAMUSCULAR | Status: DC | PRN

## 2018-02-05 MED ORDER — MAGNESIUM SULFATE 4 GM/100ML IV SOLN
4.0000 g | Freq: Once | INTRAVENOUS | Status: AC
  Administered 2018-02-05: 4 g via INTRAVENOUS
  Filled 2018-02-05: qty 100

## 2018-02-05 MED ORDER — DOPAMINE-DEXTROSE 3.2-5 MG/ML-% IV SOLN: 0.0000 ug/kg/min | INTRAVENOUS | Status: DC

## 2018-02-05 MED ORDER — ACETAMINOPHEN 160 MG/5ML PO SOLN: 650.0000 mg | Freq: Once | ORAL | Status: AC

## 2018-02-05 MED ORDER — ANTITHROMBIN III (HUMAN) 500 UNITS IV SOLR
562.0000 [IU] | Freq: Once | INTRAVENOUS | Status: AC
  Administered 2018-02-05: 562 [IU] via INTRAVENOUS
  Filled 2018-02-05: qty 11.24

## 2018-02-05 MED ORDER — LACTATED RINGERS IV SOLN: 500.0000 mL | Freq: Once | INTRAVENOUS | Status: DC | PRN

## 2018-02-05 MED ORDER — METOCLOPRAMIDE HCL 5 MG/ML IJ SOLN
10.0000 mg | Freq: Four times a day (QID) | INTRAMUSCULAR | Status: AC
  Administered 2018-02-05 – 2018-02-06 (×3): 10 mg via INTRAVENOUS
  Filled 2018-02-05 (×2): qty 2

## 2018-02-05 MED ORDER — METOPROLOL TARTRATE 12.5 MG HALF TABLET
12.5000 mg | ORAL_TABLET | Freq: Two times a day (BID) | ORAL | Status: DC
  Administered 2018-02-06 – 2018-02-07 (×4): 12.5 mg via ORAL
  Filled 2018-02-05 (×4): qty 1

## 2018-02-05 MED ORDER — VECURONIUM BROMIDE 10 MG IV SOLR
INTRAVENOUS | Status: AC
  Filled 2018-02-05: qty 20

## 2018-02-05 MED ORDER — BISACODYL 5 MG PO TBEC
10.0000 mg | DELAYED_RELEASE_TABLET | Freq: Every day | ORAL | Status: DC
  Administered 2018-02-06 – 2018-02-07 (×2): 10 mg via ORAL
  Filled 2018-02-05 (×2): qty 2

## 2018-02-05 MED ORDER — HEPARIN SODIUM (PORCINE) 1000 UNIT/ML IJ SOLN
INTRAMUSCULAR | Status: AC
  Filled 2018-02-05: qty 1

## 2018-02-05 SURGICAL SUPPLY — 106 items
BAG DECANTER FOR FLEXI CONT (MISCELLANEOUS) ×3 IMPLANT
BANDAGE ACE 4X5 VEL STRL LF (GAUZE/BANDAGES/DRESSINGS) ×3 IMPLANT
BANDAGE ACE 6X5 VEL STRL LF (GAUZE/BANDAGES/DRESSINGS) ×3 IMPLANT
BASKET HEART (ORDER IN 25'S) (MISCELLANEOUS) ×1
BASKET HEART (ORDER IN 25S) (MISCELLANEOUS) ×2 IMPLANT
BLADE MINI RND TIP GREEN BEAV (BLADE) ×1 IMPLANT
BLADE STERNUM SYSTEM 6 (BLADE) ×3 IMPLANT
BLADE SURG 11 STRL SS (BLADE) ×1 IMPLANT
BNDG GAUZE ELAST 4 BULKY (GAUZE/BANDAGES/DRESSINGS) ×3 IMPLANT
CANISTER SUCT 3000ML PPV (MISCELLANEOUS) ×3 IMPLANT
CATH ROBINSON RED A/P 18FR (CATHETERS) ×6 IMPLANT
CATH THORACIC 28FR (CATHETERS) ×4 IMPLANT
CATH THORACIC 36FR (CATHETERS) ×3 IMPLANT
CATH THORACIC 36FR RT ANG (CATHETERS) ×3 IMPLANT
CLIP VESOCCLUDE MED 24/CT (CLIP) ×2 IMPLANT
CLIP VESOCCLUDE SM WIDE 24/CT (CLIP) ×3 IMPLANT
COVER WAND RF STERILE (DRAPES) ×3 IMPLANT
CRADLE DONUT ADULT HEAD (MISCELLANEOUS) ×3 IMPLANT
DERMABOND ADVANCED (GAUZE/BANDAGES/DRESSINGS) ×2
DERMABOND ADVANCED .7 DNX12 (GAUZE/BANDAGES/DRESSINGS) IMPLANT
DRAPE CARDIOVASCULAR INCISE (DRAPES) ×1
DRAPE SLUSH/WARMER DISC (DRAPES) ×3 IMPLANT
DRAPE SRG 135X102X78XABS (DRAPES) ×2 IMPLANT
DRSG COVADERM 4X14 (GAUZE/BANDAGES/DRESSINGS) ×3 IMPLANT
ELECT CAUTERY BLADE 6.4 (BLADE) ×3 IMPLANT
ELECT REM PT RETURN 9FT ADLT (ELECTROSURGICAL) ×6
ELECTRODE REM PT RTRN 9FT ADLT (ELECTROSURGICAL) ×4 IMPLANT
FELT TEFLON 1X6 (MISCELLANEOUS) ×6 IMPLANT
GAUZE SPONGE 4X4 12PLY STRL (GAUZE/BANDAGES/DRESSINGS) ×6 IMPLANT
GLOVE BIO SURGEON STRL SZ 6 (GLOVE) IMPLANT
GLOVE BIO SURGEON STRL SZ 6.5 (GLOVE) ×4 IMPLANT
GLOVE BIO SURGEON STRL SZ7 (GLOVE) IMPLANT
GLOVE BIO SURGEON STRL SZ7.5 (GLOVE) IMPLANT
GLOVE BIOGEL PI IND STRL 6 (GLOVE) IMPLANT
GLOVE BIOGEL PI IND STRL 6.5 (GLOVE) IMPLANT
GLOVE BIOGEL PI IND STRL 7.0 (GLOVE) IMPLANT
GLOVE BIOGEL PI INDICATOR 6 (GLOVE) ×5
GLOVE BIOGEL PI INDICATOR 6.5 (GLOVE) ×1
GLOVE BIOGEL PI INDICATOR 7.0 (GLOVE) ×2
GLOVE EUDERMIC 7 POWDERFREE (GLOVE) ×6 IMPLANT
GLOVE ORTHO TXT STRL SZ7.5 (GLOVE) IMPLANT
GOWN STRL REUS W/ TWL LRG LVL3 (GOWN DISPOSABLE) ×8 IMPLANT
GOWN STRL REUS W/ TWL XL LVL3 (GOWN DISPOSABLE) ×2 IMPLANT
GOWN STRL REUS W/TWL LRG LVL3 (GOWN DISPOSABLE) ×4
GOWN STRL REUS W/TWL XL LVL3 (GOWN DISPOSABLE) ×1
HEMOSTAT POWDER SURGIFOAM 1G (HEMOSTASIS) ×9 IMPLANT
HEMOSTAT SURGICEL 2X14 (HEMOSTASIS) ×3 IMPLANT
INSERT FOGARTY 61MM (MISCELLANEOUS) IMPLANT
INSERT FOGARTY XLG (MISCELLANEOUS) IMPLANT
KIT BASIN OR (CUSTOM PROCEDURE TRAY) ×3 IMPLANT
KIT CATH CPB BARTLE (MISCELLANEOUS) ×3 IMPLANT
KIT SUCTION CATH 14FR (SUCTIONS) ×3 IMPLANT
KIT TURNOVER KIT B (KITS) ×3 IMPLANT
KIT VASOVIEW HEMOPRO 2 VH 4000 (KITS) ×3 IMPLANT
NS IRRIG 1000ML POUR BTL (IV SOLUTION) ×15 IMPLANT
PACK E OPEN HEART (SUTURE) ×3 IMPLANT
PACK OPEN HEART (CUSTOM PROCEDURE TRAY) ×3 IMPLANT
PAD ARMBOARD 7.5X6 YLW CONV (MISCELLANEOUS) ×6 IMPLANT
PAD ELECT DEFIB RADIOL ZOLL (MISCELLANEOUS) ×3 IMPLANT
PENCIL BUTTON HOLSTER BLD 10FT (ELECTRODE) ×3 IMPLANT
PUNCH AORTIC ROTATE 4.0MM (MISCELLANEOUS) IMPLANT
PUNCH AORTIC ROTATE 4.5MM 8IN (MISCELLANEOUS) ×3 IMPLANT
PUNCH AORTIC ROTATE 5MM 8IN (MISCELLANEOUS) IMPLANT
SET CARDIOPLEGIA MPS 5001102 (MISCELLANEOUS) ×1 IMPLANT
SLEEVE SURGEON STRL (DRAPES) ×1 IMPLANT
SOLUTION ANTI FOG 6CC (MISCELLANEOUS) ×1 IMPLANT
SPONGE INTESTINAL PEANUT (DISPOSABLE) IMPLANT
SPONGE LAP 18X18 X RAY DECT (DISPOSABLE) ×1 IMPLANT
SPONGE LAP 4X18 RFD (DISPOSABLE) ×3 IMPLANT
SUT BONE WAX W31G (SUTURE) ×3 IMPLANT
SUT MNCRL AB 4-0 PS2 18 (SUTURE) ×2 IMPLANT
SUT PROLENE 3 0 SH DA (SUTURE) IMPLANT
SUT PROLENE 3 0 SH1 36 (SUTURE) ×3 IMPLANT
SUT PROLENE 4 0 RB 1 (SUTURE)
SUT PROLENE 4 0 SH DA (SUTURE) IMPLANT
SUT PROLENE 4-0 RB1 .5 CRCL 36 (SUTURE) IMPLANT
SUT PROLENE 5 0 C 1 36 (SUTURE) IMPLANT
SUT PROLENE 6 0 C 1 30 (SUTURE) ×1 IMPLANT
SUT PROLENE 7 0 BV 1 (SUTURE) IMPLANT
SUT PROLENE 7 0 BV1 MDA (SUTURE) ×3 IMPLANT
SUT PROLENE 8 0 BV175 6 (SUTURE) ×1 IMPLANT
SUT SILK  1 MH (SUTURE) ×1
SUT SILK 1 MH (SUTURE) IMPLANT
SUT SILK 2 0 SH (SUTURE) ×2 IMPLANT
SUT SILK 2 0 SH CR/8 (SUTURE) ×1 IMPLANT
SUT STEEL STERNAL CCS#1 18IN (SUTURE) IMPLANT
SUT STEEL SZ 6 DBL 3X14 BALL (SUTURE) ×3 IMPLANT
SUT TEM PAC WIRE 2 0 SH (SUTURE) ×1 IMPLANT
SUT VIC AB 1 CTX 36 (SUTURE) ×2
SUT VIC AB 1 CTX36XBRD ANBCTR (SUTURE) ×4 IMPLANT
SUT VIC AB 2-0 CT1 27 (SUTURE) ×2
SUT VIC AB 2-0 CT1 TAPERPNT 27 (SUTURE) IMPLANT
SUT VIC AB 2-0 CTX 27 (SUTURE) IMPLANT
SUT VIC AB 3-0 SH 27 (SUTURE)
SUT VIC AB 3-0 SH 27X BRD (SUTURE) IMPLANT
SUT VIC AB 3-0 X1 27 (SUTURE) IMPLANT
SUT VICRYL 4-0 PS2 18IN ABS (SUTURE) IMPLANT
SYSTEM SAHARA CHEST DRAIN ATS (WOUND CARE) ×4 IMPLANT
TAPE CLOTH SURG 4X10 WHT LF (GAUZE/BANDAGES/DRESSINGS) ×1 IMPLANT
TAPE PAPER 2X10 WHT MICROPORE (GAUZE/BANDAGES/DRESSINGS) ×1 IMPLANT
TOWEL GREEN STERILE (TOWEL DISPOSABLE) ×3 IMPLANT
TOWEL GREEN STERILE FF (TOWEL DISPOSABLE) ×3 IMPLANT
TRAY FOLEY SLVR 16FR TEMP STAT (SET/KITS/TRAYS/PACK) ×3 IMPLANT
TUBING INSUFFLATION (TUBING) ×3 IMPLANT
UNDERPAD 30X30 (UNDERPADS AND DIAPERS) ×3 IMPLANT
WATER STERILE IRR 1000ML POUR (IV SOLUTION) ×6 IMPLANT

## 2018-02-05 NOTE — Brief Op Note (Signed)
02/02/2018 - 02/05/2018  12:38 PM  PATIENT:  Alex Martin  78 y.o. male  PRE-OPERATIVE DIAGNOSIS:  CORONARY DISEASE  POST-OPERATIVE DIAGNOSIS:  CORONARY DISEASE  PROCEDURE:  Procedure(s):  CORONARY ARTERY BYPASS GRAFTING x 4  ENDOSCOPIC HARVEST GREATER SAPHENOUS VEIN -Left Leg -Right Leg Explored, vein too small for harvest  TRANSESOPHAGEAL ECHOCARDIOGRAM (TEE) (N/A)  SURGEON:  Surgeon(s) and Role:    * Bartle, Fernande Boyden, MD - Primary  PHYSICIAN ASSISTANT: Edit Ricciardelli PA-C  ANESTHESIA:   general  EBL:  1200 mL   BLOOD ADMINISTERED: CELLSAVER  DRAINS: Right and Left Pleural Chest Tube, Mediastinal Chest Drains   LOCAL MEDICATIONS USED:  NONE  SPECIMEN:  No Specimen  DISPOSITION OF SPECIMEN:  N/A  COUNTS:  YES  TOURNIQUET:  * No tourniquets in log *  DICTATION: .Dragon Dictation  PLAN OF CARE: Admit to inpatient   PATIENT DISPOSITION:  ICU - intubated and hemodynamically stable.   Delay start of Pharmacological VTE agent (>24hrs) due to surgical blood loss or risk of bleeding: yes

## 2018-02-05 NOTE — Transfer of Care (Signed)
Immediate Anesthesia Transfer of Care Note  Patient: Alex Martin  Procedure(s) Performed: CORONARY ARTERY BYPASS GRAFTING (CABG) x4 USING BILATERAL IMA'S WITH LEFT GREATER SAPHENOUS VEIN HARVEST AND EXPLORATION OF RIGHT SAPHENOUS (N/A Chest) TRANSESOPHAGEAL ECHOCARDIOGRAM (TEE) (N/A )  Patient Location: ICU  Anesthesia Type:General  Level of Consciousness: sedated and Patient remains intubated per anesthesia plan  Airway & Oxygen Therapy: Patient remains intubated per anesthesia plan and Patient placed on Ventilator (see vital sign flow sheet for setting)  Post-op Assessment: Report given to RN and Post -op Vital signs reviewed and stable  Post vital signs: Reviewed and stable  Last Vitals:  Vitals Value Taken Time  BP 112/59 ABP   Temp    Pulse 83  02/05/2018  2:39 PM  Resp 15 02/05/2018  2:39 PM  SpO2 97 % 02/05/2018  2:39 PM  Vitals shown include unvalidated device data.  Last Pain:  Vitals:   02/05/18 0549  TempSrc: Oral  PainSc:      Report to [facility] RN in 2H10 - patient remained intubation, VSS during transport, RT at bedside upon arrival to unit, applied to SIMV mode, 50% FiO2, 12/5 - bilateral breath sounds present, ventilation confirmed, full report given, Cyndia Bent MD and Ola Spurr MD at bedside post transport. Patient present in stable condition, see MAR for infusions.     Complications: No apparent anesthesia complications

## 2018-02-05 NOTE — Procedures (Signed)
Extubation Procedure Note  Patient Details:   Name: Alex Martin DOB: 09/22/1940 MRN: [id number]   Airway Documentation:  Airway 7.5 mm (Active)   Vent end date: 02/05/18 Vent end time: 1800   Evaluation  O2 sats: stable throughout Complications: No apparent complications Patient did tolerate procedure well. Bilateral Breath Sounds: Pleural rub   Yes   Pt extubated per rapid wean protocol to 4L nasal cannula. Pt with good cough, no stridor and able to speak. NIF -25 and vital capacity was 127ml. Incentive spirometry started with pt.  Sharla Kidney 02/05/2018, 6:14 PM

## 2018-02-05 NOTE — Anesthesia Procedure Notes (Signed)
Arterial Line Insertion Start/End1/20/2020 6:30 AM, 02/05/2018 6:46 AM Performed by: Jearld Pies, CRNA, CRNA  Preanesthetic checklist: patient identified, IV checked, site marked, risks and benefits discussed, surgical consent, monitors and equipment checked, pre-op evaluation, timeout performed and anesthesia consent Lidocaine 1% used for infiltration and patient sedated Left, radial was placed Catheter size: 20 G Hand hygiene performed , maximum sterile barriers used  and Seldinger technique used Allen's test indicative of satisfactory collateral circulation Attempts: 1 Procedure performed without using ultrasound guided technique. Following insertion, dressing applied and Biopatch. Post procedure assessment: normal  Patient tolerated the procedure well with no immediate complications.

## 2018-02-05 NOTE — Anesthesia Procedure Notes (Signed)
Procedure Name: Intubation Date/Time: 02/05/2018 7:48 AM Performed by: Jearld Pies, CRNA Pre-anesthesia Checklist: Patient identified, Emergency Drugs available, Suction available and Patient being monitored Patient Re-evaluated:Patient Re-evaluated prior to induction Oxygen Delivery Method: Circle System Utilized Preoxygenation: Pre-oxygenation with 100% oxygen Induction Type: IV induction Ventilation: Mask ventilation without difficulty and Oral airway inserted - appropriate to patient size Laryngoscope Size: Glidescope and 4 Grade View: Grade II Tube type: Oral Tube size: 8.0 mm Number of attempts: 1 Airway Equipment and Method: Stylet and Oral airway Placement Confirmation: ETT inserted through vocal cords under direct vision,  positive ETCO2 and breath sounds checked- equal and bilateral Secured at: 23 cm Tube secured with: Tape Dental Injury: Teeth and Oropharynx as per pre-operative assessment  Comments: DL x 1 with MAC 4 - G3v; Bougie utilized with suspected esophageal insertion x 2, removed laryngoscope and mask ventilated; Glidescope utilized with G2v. + intubation

## 2018-02-05 NOTE — Anesthesia Postprocedure Evaluation (Signed)
Anesthesia Post Note  Patient: TERON BLAIS  Procedure(s) Performed: CORONARY ARTERY BYPASS GRAFTING (CABG) x4 USING BILATERAL IMA'S WITH LEFT GREATER SAPHENOUS VEIN HARVEST AND EXPLORATION OF RIGHT SAPHENOUS (N/A Chest) TRANSESOPHAGEAL ECHOCARDIOGRAM (TEE) (N/A )     Patient location during evaluation: SICU Anesthesia Type: General Level of consciousness: sedated Pain management: pain level controlled Vital Signs Assessment: post-procedure vital signs reviewed and stable Respiratory status: patient remains intubated per anesthesia plan Cardiovascular status: stable Postop Assessment: no apparent nausea or vomiting Anesthetic complications: no    Last Vitals:  Vitals:   02/05/18 1745 02/05/18 1800  BP: 111/66 (!) 107/59  Pulse: 78 76  Resp: (!) 22 (!) 25  Temp: 36.6 C 36.6 C  SpO2: 97% 94%    Last Pain:  Vitals:   02/05/18 1515  TempSrc:   PainSc: 0-No pain                 Tiajuana Amass

## 2018-02-05 NOTE — Op Note (Signed)
CARDIOVASCULAR SURGERY OPERATIVE NOTE  02/05/2018  Surgeon:  Gaye Pollack, MD  First Assistant: Ellwood Handler,  PA-C   Preoperative Diagnosis:  Severe multi-vessel coronary artery disease   Postoperative Diagnosis:  Same   Procedure:  1. Median Sternotomy 2. Extracorporeal circulation 3.   Coronary artery bypass grafting x 4   Left internal mammary artery graft to the diagonal branch of the LAD  Free right internal mammary artery graft to the OM  Sequential SVG to PDA and PL.  4.   Endoscopic vein harvest from the left leg   Anesthesia:  General Endotracheal   Clinical History/Surgical Indication:  He is a 78 year old gentleman who presents after new onset of severe substernal chest discomfort radiating to both arms around 2:30 AM this morning.  He said that he had a episode similar to this several weeks ago and took some aspirin and drove towards the emergency room but his chest pain resolved so went back home.  Last night he woke his significant other and they drove to the emergency department.  Electrocardiogram showed new ST depressions in leads V4 and V6 which were new compared to previous EKG done in 2015.  His initial troponin was negative but the second value was abnormal at 2.28 and then this afternoon was 4.38.  Cardiac catheterization today shows severe three-vessel coronary disease.  There is about 50% distal left main.  The LAD had 95% proximal stenosis.  The proximal left circumflex had 90% stenosis.  The right coronary artery had 75% mid vessel stenosis and 90% ostial posterior descending stenosis.  His EF was 25 to 35% by visual estimate with an elevated LVEDP of 32 mmHg.  2D echocardiogram this afternoon shows a left ventricular ejection fraction of 30 to 35% with basal to mid inferior septal and anteroseptal akinesis.  There is apical lateral akinesis.  There is akinesis  of the true apex.  There is apical anterior and apical inferior akinesis.  There is no aortic stenosis and only trivial regurgitation.  There is no significant mitral regurgitation.  Right ventricular function was normal.  I agree that coronary artery bypass graft surgery is the best treatment for this patient.  The anterior and apical portion of his heart do not move on catheterization or echocardiogram and it is not clear if this is all scar or hibernating myocardium.  His troponin has only gone up to 4.38 so far which is not consistent with losing enough myocardium to cause that degree of dysfunction.  It is certainly possible that he has had prior myocardial infarctions although his electrocardiogram only shows evidence of septal infarct and he has had no heart failure symptoms before and is reasonably active going to the gym and doing aerobic activity.  My hope would be that his left ventricular ejection fraction will improve with revascularization.  I discussed the operative procedure with the patient and his significant other including alternatives, benefits and risks; including but not limited to bleeding, blood transfusion, infection, stroke, myocardial infarction, graft failure, heart block requiring a permanent pacemaker, organ dysfunction, and death.  Alex Martin understands and agrees to proceed.   Preparation:  The patient was seen in the preoperative holding area and the correct patient, correct operation were confirmed with the patient after reviewing the medical record and catheterization. The consent was signed by me. Preoperative antibiotics were given. A pulmonary arterial line and radial arterial line were placed by the anesthesia team. The patient was taken back to the operating room  and positioned supine on the operating room table. After being placed under general endotracheal anesthesia by the anesthesia team a foley catheter was placed. The neck, chest, abdomen, and both legs  were prepped with betadine soap and solution and draped in the usual sterile manner. A surgical time-out was taken and the correct patient and operative procedure were confirmed with the nursing and anesthesia staff.  TEE performed by Dr. Suzette Battiest: This showed an EF of 50% with apical and apical septal akinesis. Mild central AI, tiny PFO.  Cardiopulmonary Bypass:  A median sternotomy was performed. The pericardium was opened in the midline. Right ventricular function appeared normal. The ascending aorta was of normal size and had no palpable plaque. There were no contraindications to aortic cannulation or cross-clamping. The patient was fully systemically heparinized and the ACT was maintained > 400 sec. The proximal aortic arch was cannulated with a 20 F aortic cannula for arterial inflow. Venous cannulation was performed via the right atrial appendage using a two-staged venous cannula. An antegrade cardioplegia/vent cannula was inserted into the mid-ascending aorta. Aortic occlusion was performed with a single cross-clamp. Systemic cooling to 32 degrees Centigrade and topical cooling of the heart with iced saline were used. Hyperkalemic antegrade cold blood cardioplegia was used to induce diastolic arrest and was then given at about 20 minute intervals throughout the period of arrest to maintain myocardial temperature at or below 10 degrees centigrade. A temperature probe was inserted into the interventricular septum and an insulating pad was placed in the pericardium.   Left internal mammary artery harvest:  The left side of the sternum was retracted using the Rultract retractor. The left internal mammary artery was harvested as a pedicle graft. All side branches were clipped. It was a medium-sized vessel of good quality with excellent blood flow. It was ligated distally and divided. It was sprayed with topical papaverine solution to prevent vasospasm.   Right internal mammary artery  harvest:  The right side of the sternum was retracted using the Rultract retractor. The right internal mammary artery was harvested as a free graft. All side branches were clipped. It was a medium-sized vessel of good quality with excellent blood flow. It was ligated distally and divided. It was sprayed with topical papaverine solution to prevent vasospasm.  Endoscopic vein harvest:  We initially examined the right greater saphenous vein through a small incision below the knee. We could not locate a saphenous vein at this location.  A second incision was made above the knee and the vein was located and was small. It was not harvested.  The left greater saphenous vein was harvested endoscopically through a 2 cm incision medial to the left knee. It was harvested from the upper thigh to below the knee. It was a medium-sized vein of good quality in the thigh but smaller below the knee. The side branches were all ligated with 4-0 silk ties.    Coronary arteries:  The coronary arteries were examined. There was a dense old inflammatory reaction over the epicardium that looked like the patient had had pericarditis in the past.   LAD:  The LAD was actually occluded just after the origin of a large diagonal branch which corresponds to what I saw on the cath. The distal LAD beyond the occlusion was diffusely diseased and hard with no visible lumen. The apex was thinned out and scar. The diagonal was large with no distal disease.  LCX:  The OM was a large vessel that had no  distal disease.  RCA:  Diffusely diseased. The PDA and PL were both moderate sized vessels with no distal disease.   Grafts:  1. LIMA to the diagonal branch of the LAD: 2.0 mm. It was sewn end to side using 8-0 prolene continuous suture. 2. Free RIMA to the OM:  1.75 mm. It was sewn end to side using 8-0 prolene continuous suture. 3. Sequential SVG to PDA:  1.75 mm. It was sewn sequential side to side using 7-0 prolene continuous  suture. 4. Sequential SVG to PL:  1.75 mm. It was sewn sequential end to side using 7-0 prolene continuous suture.  The proximal vein graft anastomosis was performed to the mid-ascending aorta using continuous 6-0 prolene suture. The proximal anastomosis of the free RIMA graft was performed to the mid ascending aorta using continuous 7-0 prolene suture. Graft markers were placed around the proximal anastomoses.   Completion:  The patient was rewarmed to 37 degrees Centigrade. The clamp was removed from the LIMA pedicle and there was rapid warming of the septum and return of ventricular fibrillation. The crossclamp was removed with a time of 117 minutes. There was spontaneous return of sinus rhythm. The distal and proximal anastomoses were checked for hemostasis. The position of the grafts was satisfactory. Two temporary epicardial pacing wires were placed on the right atrium and two on the right ventricle. The patient was weaned from CPB without difficulty on dopamine 5 mcg. CPB time was 141 minutes. Cardiac output was 5 LPM. TEE showed good LV systolic function. Heparin was fully reversed with protamine and the aortic and venous cannulas removed. Hemostasis was achieved. Mediastinal and bilateral pleural drainage tubes were placed. The sternum was closed with double #6 stainless steel wires. The fascia was closed with continuous # 1 vicryl suture. The subcutaneous tissue was closed with 2-0 vicryl continuous suture. The skin was closed with 3-0 vicryl subcuticular suture. All sponge, needle, and instrument counts were reported correct at the end of the case. Dry sterile dressings were placed over the incisions and around the chest tubes which were connected to pleurevac suction. The patient was then transported to the surgical intensive care unit in stable condition.

## 2018-02-05 NOTE — Progress Notes (Signed)
Patient ID: Alex Martin, male   DOB: 1941/01/16, 78 y.o.   MRN: [id number] EVENING ROUNDS NOTE :     [address]       [place] [address]             [phone]                 Day of Surgery Procedure(s) (LRB): CORONARY ARTERY BYPASS GRAFTING (CABG) x4 USING BILATERAL IMA'S WITH LEFT GREATER SAPHENOUS VEIN HARVEST AND EXPLORATION OF RIGHT SAPHENOUS (N/A) TRANSESOPHAGEAL ECHOCARDIOGRAM (TEE) (N/A)  Total Length of Stay:  LOS: 3 days  BP (!) 89/50   Pulse 73   Temp 98.2 F (36.8 C)   Resp 12   Ht 5\' 7"  (1.702 m)   Wt 76.5 kg   SpO2 97%   BMI 26.42 kg/m   .Intake/Output      01/19 0701 - 01/20 0700 01/20 0701 - 01/21 0700   P.O.     I.V. (mL/kg)  3159.6 (41.3)   Blood  576   IV Piggyback  420.5   Total Intake(mL/kg)  4156.1 (54.3)   Urine (mL/kg/hr)  1985 (2.5)   Blood  1200   Chest Tube  276   Total Output  3461   Net  +695.1          . sodium chloride Stopped (02/05/18 1619)  . [START ON 02/06/2018] sodium chloride    . sodium chloride 20 mL/hr at 02/05/18 1456  . albumin human 12.5 g (02/05/18 1454)  . cefUROXime (ZINACEF)  IV    . dexmedetomidine (PRECEDEX) IV infusion Stopped (02/05/18 1618)  . DOPamine 3 mcg/kg/min (02/05/18 1700)  . famotidine (PEPCID) IV Stopped (02/05/18 1510)  . insulin 2.4 mL/hr at 02/05/18 1700  . lactated ringers    . lactated ringers    . lactated ringers 20 mL/hr at 02/05/18 1700  . magnesium sulfate 20 mL/hr at 02/05/18 1700  . nitroGLYCERIN Stopped (02/05/18 1539)  . phenylephrine (NEO-SYNEPHRINE) Adult infusion 25 mcg/min (02/05/18 1700)  . potassium chloride 10 mEq (02/05/18 1725)  . vancomycin       Lab Results  Component Value Date   WBC 14.1 (H) 02/05/2018   HGB 13.8 02/05/2018   HCT 41.3 02/05/2018   PLT 114 (L) 02/05/2018   GLUCOSE 149 (H) 02/05/2018   CHOL 194 02/03/2018   TRIG 127 02/03/2018   HDL 59 02/03/2018   LDLCALC 110 (H) 02/03/2018   ALT 15 11/07/2017   AST 17 11/07/2017   NA 139 02/05/2018   K 3.9 02/05/2018   CL 103 02/05/2018   CREATININE 1.00 02/05/2018   BUN 18 02/05/2018   CO2 25 02/05/2018   TSH 3.65 06/18/2015   PSA 0.4 11/07/2017   INR 1.44 02/05/2018   HGBA1C 5.5 02/02/2018   stable early postop Waking up, neuro intact weaning vent Total ct output 400, decreased from 1 firstt hour postop   Grace Isaac MD  Beeper [phone] Office [phone] 02/05/2018 5:32 PM

## 2018-02-05 NOTE — Progress Notes (Signed)
  Echocardiogram Echocardiogram Transesophageal has been performed.  Alex Martin 02/05/2018, 8:56 AM

## 2018-02-05 NOTE — Progress Notes (Signed)
Report called to Anesthesia, Pt has been CP free, VS stable. Pt received Valium 5 mg PO and Metoprolol 12.5 mg. All belongings taken with wife. Jessie Foot, RN

## 2018-02-05 NOTE — Anesthesia Procedure Notes (Signed)
Central Venous Catheter Insertion Performed by: Nolon Nations, MD, anesthesiologist Start/End1/20/2020 6:20 AM, 02/05/2018 6:45 AM Patient location: Pre-op. Preanesthetic checklist: patient identified, IV checked, site marked, risks and benefits discussed, surgical consent, monitors and equipment checked, pre-op evaluation, timeout performed and anesthesia consent Position: Trendelenburg Lidocaine 1% used for infiltration and patient sedated Hand hygiene performed  and maximum sterile barriers used  Catheter size: 8.5 Fr PA cath was placed.Sheath introducer PA Cath depth:45 Procedure performed using ultrasound guided technique. Ultrasound Notes:anatomy identified, needle tip was noted to be adjacent to the nerve/plexus identified, no ultrasound evidence of intravascular and/or intraneural injection and image(s) printed for medical record Attempts: 1 Following insertion, line sutured, dressing applied and Biopatch. Post procedure assessment: blood return through all ports, free fluid flow and no air  Patient tolerated the procedure well with no immediate complications.

## 2018-02-06 ENCOUNTER — Inpatient Hospital Stay (HOSPITAL_COMMUNITY): Payer: Medicare Other

## 2018-02-06 ENCOUNTER — Encounter (HOSPITAL_COMMUNITY): Payer: Self-pay | Admitting: Surgery

## 2018-02-06 LAB — GLUCOSE, CAPILLARY
Glucose-Capillary: 100 mg/dL — ABNORMAL HIGH (ref 70–99)
Glucose-Capillary: 102 mg/dL — ABNORMAL HIGH (ref 70–99)
Glucose-Capillary: 102 mg/dL — ABNORMAL HIGH (ref 70–99)
Glucose-Capillary: 102 mg/dL — ABNORMAL HIGH (ref 70–99)
Glucose-Capillary: 105 mg/dL — ABNORMAL HIGH (ref 70–99)
Glucose-Capillary: 108 mg/dL — ABNORMAL HIGH (ref 70–99)
Glucose-Capillary: 134 mg/dL — ABNORMAL HIGH (ref 70–99)
Glucose-Capillary: 153 mg/dL — ABNORMAL HIGH (ref 70–99)
Glucose-Capillary: 174 mg/dL — ABNORMAL HIGH (ref 70–99)
Glucose-Capillary: 95 mg/dL (ref 70–99)

## 2018-02-06 LAB — PREPARE PLATELET PHERESIS: Unit division: 0

## 2018-02-06 LAB — BASIC METABOLIC PANEL
Anion gap: 6 (ref 5–15)
Anion gap: 10 (ref 5–15)
BUN: 17 mg/dL (ref 8–23)
BUN: 24 mg/dL — ABNORMAL HIGH (ref 8–23)
CALCIUM: 7.8 mg/dL — AB (ref 8.9–10.3)
CO2: 23 mmol/L (ref 22–32)
CO2: 22 mmol/L (ref 22–32)
Calcium: 7.7 mg/dL — ABNORMAL LOW (ref 8.9–10.3)
Chloride: 110 mmol/L (ref 98–111)
Chloride: 103 mmol/L (ref 98–111)
Creatinine, Ser: 1.11 mg/dL (ref 0.61–1.24)
Creatinine, Ser: 1.82 mg/dL — ABNORMAL HIGH (ref 0.61–1.24)
GFR calc non Af Amer: >60 mL/min (ref >60)
GFR calc non Af Amer: 35 mL/min — ABNORMAL LOW (ref >60)
GFR, EST AFRICAN AMERICAN: 41 mL/min — AB (ref >60)
Glucose, Bld: 109 mg/dL — ABNORMAL HIGH (ref 70–99)
Glucose, Bld: 165 mg/dL — ABNORMAL HIGH (ref 70–99)
Potassium: 4.5 mmol/L (ref 3.5–5.1)
Potassium: 4.6 mmol/L (ref 3.5–5.1)
SODIUM: 139 mmol/L (ref 135–145)
Sodium: 135 mmol/L (ref 135–145)

## 2018-02-06 LAB — CBC
HCT: 36.1 % — ABNORMAL LOW (ref 39.0–52.0)
HCT: 34.1 % — ABNORMAL LOW (ref 39.0–52.0)
Hemoglobin: 12 g/dL — ABNORMAL LOW (ref 13.0–17.0)
Hemoglobin: 10.8 g/dL — ABNORMAL LOW (ref 13.0–17.0)
MCH: 32.2 pg (ref 26.0–34.0)
MCH: 31.6 pg (ref 26.0–34.0)
MCHC: 33.2 g/dL (ref 30.0–36.0)
MCHC: 31.7 g/dL (ref 30.0–36.0)
MCV: 96.8 fL (ref 80.0–100.0)
MCV: 99.7 fL (ref 80.0–100.0)
Platelets: 165 10*3/uL (ref 150–400)
Platelets: 140 10*3/uL — ABNORMAL LOW (ref 150–400)
RBC: 3.73 MIL/uL — AB (ref 4.22–5.81)
RBC: 3.42 MIL/uL — ABNORMAL LOW (ref 4.22–5.81)
RDW: 15 % (ref 11.5–15.5)
RDW: 15 % (ref 11.5–15.5)
WBC: 14.2 10*3/uL — ABNORMAL HIGH (ref 4.0–10.5)
WBC: 15.6 10*3/uL — ABNORMAL HIGH (ref 4.0–10.5)
nRBC: 0 % (ref 0.0–0.2)
nRBC: 0 % (ref 0.0–0.2)

## 2018-02-06 LAB — BPAM PLATELET PHERESIS
Blood Product Expiration Date: 202001212359
ISSUE DATE / TIME: 202001201630
Unit Type and Rh: 8400

## 2018-02-06 LAB — MAGNESIUM
Magnesium: 3 mg/dL — ABNORMAL HIGH (ref 1.7–2.4)
Magnesium: 2.5 mg/dL — ABNORMAL HIGH (ref 1.7–2.4)

## 2018-02-06 MED ORDER — INSULIN ASPART 100 UNIT/ML ~~LOC~~ SOLN
0.0000 [IU] | Freq: Three times a day (TID) | SUBCUTANEOUS | Status: DC
  Administered 2018-02-06: 2 [IU] via SUBCUTANEOUS
  Administered 2018-02-06: 4 [IU] via SUBCUTANEOUS
  Administered 2018-02-07 (×2): 2 [IU] via SUBCUTANEOUS

## 2018-02-06 MED ORDER — TRAMADOL HCL 50 MG PO TABS
50.0000 mg | ORAL_TABLET | ORAL | Status: DC | PRN
  Administered 2018-02-06 – 2018-02-07 (×2): 50 mg via ORAL
  Filled 2018-02-06 (×2): qty 1

## 2018-02-06 MED ORDER — FUROSEMIDE 10 MG/ML IJ SOLN
40.0000 mg | Freq: Once | INTRAMUSCULAR | Status: AC
  Administered 2018-02-06: 40 mg via INTRAVENOUS
  Filled 2018-02-06: qty 4

## 2018-02-06 MED FILL — Potassium Chloride Inj 2 mEq/ML: INTRAVENOUS | Qty: 40 | Status: AC

## 2018-02-06 MED FILL — Heparin Sodium (Porcine) Inj 1000 Unit/ML: INTRAMUSCULAR | Qty: 30 | Status: AC

## 2018-02-06 MED FILL — Thrombin (Recombinant) For Soln 20000 Unit: CUTANEOUS | Qty: 1 | Status: AC

## 2018-02-06 MED FILL — Magnesium Sulfate Inj 50%: INTRAMUSCULAR | Qty: 10 | Status: AC

## 2018-02-06 NOTE — Progress Notes (Signed)
1 Day Post-Op Procedure(s) (LRB): CORONARY ARTERY BYPASS GRAFTING (CABG) x4 USING BILATERAL IMA'S WITH LEFT GREATER SAPHENOUS VEIN HARVEST AND EXPLORATION OF RIGHT SAPHENOUS (N/A) TRANSESOPHAGEAL ECHOCARDIOGRAM (TEE) (N/A) Subjective: No complaints  Objective: Vital signs in last 24 hours: Temp:  [97.3 F (36.3 C)-98.8 F (37.1 C)] 98.6 F (37 C) (01/21 0700) Pulse Rate:  [72-115] 108 (01/21 0700) Cardiac Rhythm: Normal sinus rhythm;Sinus tachycardia (01/21 0400) Resp:  [10-37] 12 (01/21 0700) BP: (85-142)/(40-73) 119/60 (01/21 0700) SpO2:  [92 %-100 %] 97 % (01/21 0700) Arterial Line BP: (76-145)/(43-76) 121/59 (01/21 0700) FiO2 (%):  [40 %-50 %] 40 % (01/20 1737) Weight:  [80.7 kg] 80.7 kg (01/21 0500)  Hemodynamic parameters for last 24 hours: PAP: (17-40)/(5-17) 28/17 CO:  [2.7 L/min-4.1 L/min] 3.8 L/min CI:  [1.4 L/min/m2-2.2 L/min/m2] 2 L/min/m2  Intake/Output from previous day: 01/20 0701 - 01/21 0700 In: 5483.1 [P.O.:120; I.V.:3690.4; Blood:772; IV Piggyback:900.7] Out: 1696 [Urine:3030; Blood:1200; Chest Tube:566] Intake/Output this shift: No intake/output data recorded.  General appearance: alert and cooperative Neurologic: intact Heart: regular rate and rhythm, S1, S2 normal, no murmur, click, rub or gallop Lungs: clear to auscultation bilaterally Extremities: extremities normal, atraumatic, no cyanosis or edema Wound: dressings dry  Lab Results: Recent Labs    02/05/18 1941 02/05/18 1942 02/06/18 0407  WBC 14.5*  --  14.2*  HGB 12.6* 12.6* 12.0*  HCT 38.2* 37.0* 36.1*  PLT 160  --  165   BMET:  Recent Labs    02/05/18 0420  02/05/18 1942 02/06/18 0407  NA 138   < > 140 139  K 3.8   < > 5.1 4.5  CL 102   < > 108 110  CO2 25  --   --  23  GLUCOSE 102*   < > 115* 109*  BUN 22   < > 21 17  CREATININE 1.23   < > 0.90 1.11  CALCIUM 8.7*  --   --  7.8*   < > = values in this interval not displayed.    PT/INR:  Recent Labs    02/05/18 1500   LABPROT 17.4*  INR 1.44   ABG    Component Value Date/Time   PHART 7.354 02/05/2018 1904   HCO3 22.3 02/05/2018 1904   TCO2 25 02/05/2018 1942   ACIDBASEDEF 3.0 (H) 02/05/2018 1904   O2SAT 95.0 02/05/2018 1904   CBG (last 3)  Recent Labs    02/06/18 0305 02/06/18 0410 02/06/18 0553  GLUCAP 102* 102* 102*   CXR: bibasilar atelectasis  ECG: sinus, anterolateral T-wave abn  Assessment/Plan: S/P Procedure(s) (LRB): CORONARY ARTERY BYPASS GRAFTING (CABG) x4 USING BILATERAL IMA'S WITH LEFT GREATER SAPHENOUS VEIN HARVEST AND EXPLORATION OF RIGHT SAPHENOUS (N/A) TRANSESOPHAGEAL ECHOCARDIOGRAM (TEE) (N/A)  POD 1  Hemodynamically stable in sinus rhythm. Wean off dopamine. Continue low dose Lopressor.  DC all chest tubes, swan, arterial line.  DC insulin drip and check SSI today. Preop Hgb A1c was 5.5 so should be able to stop later.  OOB, IS.  Presented with NSTEMI. Will plan to continue ASA and start Plavix after pacing wires out.     LOS: 4 days    Gaye Pollack 02/06/2018

## 2018-02-06 NOTE — Progress Notes (Signed)
TCTS BRIEF SICU PROGRESS NOTE  1 Day Post-Op  S/P Procedure(s) (LRB): CORONARY ARTERY BYPASS GRAFTING (CABG) x4 USING BILATERAL IMA'S WITH LEFT GREATER SAPHENOUS VEIN HARVEST AND EXPLORATION OF RIGHT SAPHENOUS (N/A) TRANSESOPHAGEAL ECHOCARDIOGRAM (TEE) (N/A)   Stable day NSR w/ stable BP Breathing comfortably w/ O2 sats 98% UOP adequate  Plan: Continue current plan  Rexene Alberts, MD 02/06/2018 4:52 PM

## 2018-02-07 ENCOUNTER — Inpatient Hospital Stay (HOSPITAL_COMMUNITY): Payer: Medicare Other

## 2018-02-07 LAB — BASIC METABOLIC PANEL
Anion gap: 7 (ref 5–15)
BUN: 29 mg/dL — AB (ref 8–23)
CO2: 25 mmol/L (ref 22–32)
Calcium: 7.9 mg/dL — ABNORMAL LOW (ref 8.9–10.3)
Chloride: 102 mmol/L (ref 98–111)
Creatinine, Ser: 1.67 mg/dL — ABNORMAL HIGH (ref 0.61–1.24)
GFR calc Af Amer: 45 mL/min — ABNORMAL LOW (ref >60)
GFR calc non Af Amer: 39 mL/min — ABNORMAL LOW (ref >60)
Glucose, Bld: 130 mg/dL — ABNORMAL HIGH (ref 70–99)
Potassium: 4.4 mmol/L (ref 3.5–5.1)
Sodium: 134 mmol/L — ABNORMAL LOW (ref 135–145)

## 2018-02-07 LAB — CBC
HCT: 30 % — ABNORMAL LOW (ref 39.0–52.0)
Hemoglobin: 9.4 g/dL — ABNORMAL LOW (ref 13.0–17.0)
MCH: 31.1 pg (ref 26.0–34.0)
MCHC: 31.3 g/dL (ref 30.0–36.0)
MCV: 99.3 fL (ref 80.0–100.0)
Platelets: 127 10*3/uL — ABNORMAL LOW (ref 150–400)
RBC: 3.02 MIL/uL — ABNORMAL LOW (ref 4.22–5.81)
RDW: 15 % (ref 11.5–15.5)
WBC: 10.5 10*3/uL (ref 4.0–10.5)
nRBC: 0 % (ref 0.0–0.2)

## 2018-02-07 LAB — GLUCOSE, CAPILLARY
GLUCOSE-CAPILLARY: 122 mg/dL — AB (ref 70–99)
Glucose-Capillary: 151 mg/dL — ABNORMAL HIGH (ref 70–99)
Glucose-Capillary: 127 mg/dL — ABNORMAL HIGH (ref 70–99)
Glucose-Capillary: 103 mg/dL — ABNORMAL HIGH (ref 70–99)

## 2018-02-07 MED ORDER — SIMETHICONE 80 MG PO CHEW
80.0000 mg | CHEWABLE_TABLET | Freq: Four times a day (QID) | ORAL | Status: AC
  Administered 2018-02-07 – 2018-02-08 (×4): 80 mg via ORAL
  Filled 2018-02-07 (×4): qty 1

## 2018-02-07 NOTE — Progress Notes (Signed)
2 Days Post-Op Procedure(s) (LRB): CORONARY ARTERY BYPASS GRAFTING (CABG) x4 USING BILATERAL IMA'S WITH LEFT GREATER SAPHENOUS VEIN HARVEST AND EXPLORATION OF RIGHT SAPHENOUS (N/A) TRANSESOPHAGEAL ECHOCARDIOGRAM (TEE) (N/A) Subjective: No complaints. Hungry, passing flatus but no BM yet Ambulating well  Objective: Vital signs in last 24 hours: Temp:  [97.6 F (36.4 C)-98.4 F (36.9 C)] 98 F (36.7 C) (01/22 0756) Pulse Rate:  [75-116] 90 (01/22 0621) Cardiac Rhythm: Normal sinus rhythm (01/22 0400) Resp:  [10-24] 20 (01/22 0621) BP: (102-148)/(51-80) 119/68 (01/22 0621) SpO2:  [89 %-99 %] 89 % (01/22 0621) Weight:  [81.6 kg] 81.6 kg (01/22 0500)  Hemodynamic parameters for last 24 hours:    Intake/Output from previous day: 01/21 0701 - 01/22 0700 In: 1761.4 [P.O.:1200; I.V.:461.3; IV Piggyback:100.1] Out: 995 [Urine:945; Chest Tube:50] Intake/Output this shift: No intake/output data recorded.  General appearance: alert and cooperative Heart: regular rate and rhythm, S1, S2 normal, no murmur, click, rub or gallop Lungs: crackles in bases Extremities: extremities normal, atraumatic, no cyanosis or edema Wound: dressings dry  Lab Results: Recent Labs    02/06/18 1630 02/07/18 0455  WBC 15.6* 10.5  HGB 10.8* 9.4*  HCT 34.1* 30.0*  PLT 140* 127*   BMET:  Recent Labs    02/06/18 1630 02/07/18 0455  NA 135 134*  K 4.6 4.4  CL 103 102  CO2 22 25  GLUCOSE 165* 130*  BUN 24* 29*  CREATININE 1.82* 1.67*  CALCIUM 7.7* 7.9*    PT/INR:  Recent Labs    02/05/18 1500  LABPROT 17.4*  INR 1.44   ABG    Component Value Date/Time   PHART 7.354 02/05/2018 1904   HCO3 22.3 02/05/2018 1904   TCO2 25 02/05/2018 1942   ACIDBASEDEF 3.0 (H) 02/05/2018 1904   O2SAT 95.0 02/05/2018 1904   CBG (last 3)  Recent Labs    02/06/18 1620 02/06/18 2227 02/07/18 0613  GLUCAP 153* 134* 127*   CXR: bibasilar atelectasis  Assessment/Plan: S/P Procedure(s)  (LRB): CORONARY ARTERY BYPASS GRAFTING (CABG) x4 USING BILATERAL IMA'S WITH LEFT GREATER SAPHENOUS VEIN HARVEST AND EXPLORATION OF RIGHT SAPHENOUS (N/A) TRANSESOPHAGEAL ECHOCARDIOGRAM (TEE) (N/A)  POD 2 Hemodynamically stable in sinus rhythm. Increase Lopressor to 25 bid. Do not resume ARB with creat elevation postop.  Creat coming back down and diuresed yesterday. Probably related to preop ARB and pump run. Follow up BMET in am.  Glucose under good control and preop Hgb A1c 5.5 so will stop CBG's  DC sleeve and foley.  IS, ambulation  Transfer to 4E.  Presented with NSTEMI. Continue ASA and start Plavix once pacing wires out.      LOS: 5 days    Gaye Pollack 02/07/2018

## 2018-02-07 NOTE — Plan of Care (Signed)
  Problem: Clinical Measurements: Goal: Ability to maintain clinical measurements within normal limits will improve Outcome: Progressing Goal: Will remain free from infection Outcome: Progressing Goal: Respiratory complications will improve Outcome: Progressing Goal: Cardiovascular complication will be avoided Outcome: Progressing   Problem: Nutrition: Goal: Adequate nutrition will be maintained Outcome: Progressing   Problem: Pain Managment: Goal: General experience of comfort will improve Outcome: Progressing   Problem: Cardiac: Goal: Will achieve and/or maintain hemodynamic stability Outcome: Progressing   Problem: Respiratory: Goal: Respiratory status will improve Outcome: Progressing   Problem: Skin Integrity: Goal: Wound healing without signs and symptoms of infection Outcome: Progressing Goal: Risk for impaired skin integrity will decrease Outcome: Progressing

## 2018-02-07 NOTE — Discharge Instructions (Signed)

## 2018-02-07 NOTE — Progress Notes (Signed)
CT surgery p.m. Rounds  Patient has stable day with mobilization maintaining sinus rhythm Adequate diuresis today, patient on room air

## 2018-02-07 NOTE — Progress Notes (Signed)
Spoke with RN Sybil, RN is aware of the DC central line order and will remove line

## 2018-02-07 NOTE — Discharge Summary (Signed)
Physician Discharge Summary  Patient ID: Alex Martin MRN: [id number] DOB/AGE: [birth year] 78 y.o.  Admit date: 02/02/2018 Discharge date: 02/10/2018  Admission Diagnoses: Non ST-elevation myocardial infarction.  Discharge Diagnoses:  Principal Problem:   NSTEMI (non-ST elevated myocardial infarction) ([facility]) Active Problems:   Essential hypertension   Dyslipidemia, goal LDL below 70   Acute systolic heart failure (HCC)   S/P CABG x 4 Multi-vessel coronary artery disease Left ventricular dysfunction (pre-op EF 25-35%) Mild post-op renal insufficiency   Discharged Condition: stable  Hospital Course:  The patient is a 78 year old male with no prior cardiac history who presented to the ED with substernal chest pain and radiation to the back and both arms.  He has subsequently ruled in for non-STEMI.  He does have cardiac risk factors including hypertension and hyperlipidemia.  Also has a family history of coronary artery disease.  His father had a myocardial infarction in his late 32s to 49s.  He has a remote history of tobacco use.  He quit smoking in 1994.  He has no history of diabetes mellitus.  He stays pretty active and is enrolled in a [employer] program for which she has not had any chest pain or dyspnea. He does report one less intense episode that was around one month ago for which he took aspirin and it resolved. He dis not seek medical evaluation at that time.       He developed chest pain approximately 2:30 AM on the morning of admission.  It was substernal and there was radiation to both arms.  He denied any associated dyspnea, diaphoresis, nausea or vomiting.  He woke his wife and they drove to the emergency department.  EKG revealed new ST depressions in leads V4 and V6 which were new compared to a previous EKG done in 2015.  His initial troponin I was negative however the second value was abnormal at 2.28.  He was admitted for further evaluation and treatment.  He was  initiated on intravenous heparin and taken to the cardiac catheterization lab where he was found to have severe three-vessel coronary artery disease with some distal left main disease as well.  He is also noted to have significant LV dysfunction with ejection fraction at 25 to 35% by visual estimate.  Additionally there was moderately elevated LVEDP.  The full report is listed below.  We are asked to see the patient in cardiothoracic surgical consultation for consideration of coronary artery surgical revascularization.  After review of his clinical course, Dr. Cyndia Bent recommended coronary artery bypass grafting and the patient elected to proceed.  He was taken to the operating room on 02/05/18 where CABGx4 was accomplished without complication. Following surgery, he was transferred to the surgical ICU where he remained hemodynamically stable.  He was extubated routinely. Inotropic support was wean off on POD1. Glucose was closely monitored and sliding scale insulin provided as required. ASA and metoprolol were initiated early post-op. He had a slight increase in his creatinine on POD1 that was trending down by the next day. He developed Atrial Fibrillation with RVR.  He was treated with IV Amiodarone with successful conversion to NSR.  He was transitioned to an oral regimen prior to discharge.  He was transferred to the telemetry unit on 02/09/2018.  He continued to make progress.  His pacing wires were removed without difficulty.  He remains in NSR.  He was started on a reduced dose of Cozaar for HTN.  He was started on Plavix for ACS.  He is ambulating independently.  His incisions are healing without evidence of infection.  He is medically stable for discharge home today.  Significant Diagnostic Studies: angiography:   Left Heart Cath 02/02/18  Diagnostic  Dominance: Right  Left Main  Dist LM lesion 50% stenosed  Dist LM lesion is 50% stenosed. The lesion is discrete.  Left Anterior Descending  There is a  dual LAD with a large primarily septal branch and a large diagonal branch.  Prox LAD lesion 95% stenosed  Prox LAD lesion is 95% stenosed.  Left Circumflex  Ost Cx to Prox Cx lesion 90% stenosed  Ost Cx to Prox Cx lesion is 90% stenosed.  Right Coronary Artery  Mid RCA lesion 75% stenosed  Mid RCA lesion is 75% stenosed.  Right Posterior Descending Artery  Ost RPDA to RPDA lesion 90% stenosed  Ost RPDA to RPDA lesion is 90% stenosed.  Intervention   No interventions have been documented.  Wall Motion   Resting               Left Heart   Left Ventricle The left ventricle is mildly dilated. There is severe left ventricular systolic dysfunction. LV end diastolic pressure is moderately elevated. The left ventricular ejection fraction is 25-35% by visual estimate. There are LV function abnormalities due to segmental dysfunction. There is trivial (1+) mitral regurgitation.   ECHO 02/02/18                             *[facility]*                         [address], [place] [address]                            [phone]  ------------------------------------------------------------------- Transthoracic Echocardiography  Patient:    Alex Martin, Alex Martin MR #:       [id number] Study Date: 02/02/2018 Gender:     M Age:        36 Height:     170.2 cm Weight:     81.6 kg BSA:        1.98 m^2 Pt. Status: Room:       6E10C   ADMITTING    Candee Furbish, M.D.  Ernestene Mention, [place]  REFERRING    Rosita Fire, Brittainy M  PERFORMING   [facility], Inpatient  ATTENDING    Deno Etienne  SONOGRAPHER  Jannett Celestine, RDCS  cc:  ------------------------------------------------------------------- LV EF: 30% -   35%  ------------------------------------------------------------------- History:   Risk factors:   Hypertension.  ------------------------------------------------------------------- Study Conclusions  - Left ventricle: The cavity size was normal. Wall thickness was   increased in a pattern of moderate LVH. Basal to mid inferoseptal   and anteroseptal akinesis. Apical lateral akinesis. Akinesis of   the true apex. Apical anterior akinesis. Apical inferior   akinesis. Systolic function was moderately to severely reduced.   The estimated ejection fraction was in the range of 30% to 35%.   Doppler parameters are consistent with abnormal left ventricular   relaxation (grade 1 diastolic  dysfunction). - Aortic valve: There was no stenosis. There was trivial   regurgitation. - Mitral valve: Mildly calcified annulus. Mildly calcified leaflets   . There was no significant regurgitation. - Right ventricle: The cavity size was normal. Systolic function   was normal. - Tricuspid valve: Peak RV-RA gradient (S): 37 mm Hg. - Pulmonary arteries: PA peak pressure: 40 mm Hg (S). - Inferior vena cava: The vessel was normal in size. The   respirophasic diameter changes were in the normal range (>= 50%),   consistent with normal central venous pressure.  Impressions:  - Normal LV size with moderate LV hypertrophy. EF 30-35%, wall   motion abnormalities in the pattern of a LAD infarct. Normal RV   size and systolic function. No significant valvular   abnormalities. Mild pulmonary hypertension.   Treatments:  CARDIOVASCULAR SURGERY OPERATIVE NOTE  02/05/2018  Surgeon:  Gaye Pollack, MD  First Assistant: Ellwood Handler,  PA-C   Preoperative Diagnosis:  Severe multi-vessel coronary artery disease   Postoperative Diagnosis:  Same   Procedure:  1. Median Sternotomy 2. Extracorporeal circulation 3.   Coronary artery bypass grafting x 4   Left internal mammary artery graft to the diagonal branch of the LAD  Free right internal mammary artery graft to the  OM  Sequential SVG to PDA and PL.  4.   Endoscopic vein harvest from the left leg   Anesthesia:  General Endotracheal   Discharge Exam: Blood pressure (!) 142/63, pulse 71, temperature (!) 97.4 F (36.3 C), temperature source Oral, resp. rate (!) 35, height 5\' 7"  (1.702 m), weight 81.3 kg, SpO2 95 %.  General appearance: alert, cooperative and no distress Heart: regular rate and rhythm Lungs: clear to auscultation bilaterally Abdomen: soft, non-tender; bowel sounds normal; no masses,  no organomegaly Extremities: edema trace Wound: clean and dry, ecchymosis present  Discharge disposition: 01-Home or Self Care   Discharge Medications:   Discharge Instructions    Amb Referral to Cardiac Rehabilitation   Complete by:  As directed    Diagnosis:   CABG NSTEMI     CABG X ___:  4     Allergies as of 02/10/2018      Reactions   Amlodipine Swelling   swelling in ankles   Shellfish Allergy Other (See Comments)   N&V AFTER EATING MUSSELS      Medication List    STOP taking these medications   losartan-hydrochlorothiazide 100-12.5 MG tablet Commonly known as:  HYZAAR     TAKE these medications   acetaminophen 325 MG tablet Commonly known as:  TYLENOL Take 2 tablets (650 mg total) by mouth every 6 (six) hours as needed for mild pain.   amiodarone 200 MG tablet Commonly known as:  PACERONE Take 1 tablet (200 mg total) by mouth 2 (two) times daily.   aspirin EC 81 MG tablet Take 81 mg by mouth daily.   atorvastatin 80 MG tablet Commonly known as:  LIPITOR Take 1 tablet (80 mg total) by mouth daily at 6 PM. What changed:    medication strength  how much to take  when to take this   clopidogrel 75 MG tablet Commonly known as:  PLAVIX Take 1 tablet (75 mg total) by mouth daily.   latanoprost 0.005 % ophthalmic solution Commonly known as:  XALATAN Place 1 drop into both eyes at bedtime.   losartan 25 MG tablet Commonly known as:  COZAAR Take 1  tablet (25 mg total) by mouth daily.  metoprolol tartrate 25 MG tablet Commonly known as:  LOPRESSOR Take 1 tablet (25 mg total) by mouth 2 (two) times daily.   multivitamin tablet Take 1 tablet by mouth daily.   NEXIUM 40 MG capsule Generic drug:  esomeprazole TAKE 1 CAPSULE (40 MG TOTAL) BY MOUTH DAILY. What changed:  See the new instructions.   timolol 0.5 % ophthalmic solution Commonly known as:  TIMOPTIC Place 1 drop into both eyes daily.   traMADol 50 MG tablet Commonly known as:  ULTRAM Take 1 tablet (50 mg total) by mouth every 6 (six) hours as needed for moderate pain.   triamcinolone cream 0.1 % Commonly known as:  KENALOG Apply 1 application topically 2 (two) times daily as needed.   valACYclovir 1000 MG tablet Commonly known as:  VALTREX Take 2 tablets (2,000 mg total) by mouth 2 (two) times daily. X 1 day   VESICARE 5 MG tablet Generic drug:  solifenacin TAKE 1 TABLET BY MOUTH EVERY DAY What changed:  how much to take   VITAMIN B-12 PO Take 1 tablet by mouth daily.            Durable Medical Equipment  (From admission, onward)         Start     Ordered   02/10/18 0750  For home use only DME Walker rolling  Once    Question:  Patient needs a walker to treat with the following condition  Answer:  Physical deconditioning   02/10/18 0749   02/09/18 1658  For home use only DME Walker  Once    Question:  Patient needs a walker to treat with the following condition  Answer:  Weak   02/09/18 1658        The patient has been discharged on:   1.Beta Blocker:  Yes [ X  ]                              No   [   ]                              If No, reason:  2.Ace Inhibitor/ARB: Yes [ X  ]                                     No  [    ]                                     If No, reason:  3.Statin:   Yes [ X  ]                  No  [   ]                  If No, reason:  4.Ecasa:  Yes  [ X  ]                  No   [   ]                  If No,  reason:   Follow-up Information    Burtis Junes, NP. Go to.   Specialties:  Nurse Practitioner, Interventional  Cardiology, Cardiology, Radiology Why:  Appointment scheduled for Wednesday 02/21/18 at [place] information: [place] [place] [address] [phone]        [facility] [place] Follow up on 02/16/2018.   Specialty:  Cardiothoracic Surgery Why:  Appointment is at 11:00 Contact information: [place], [place] [phone]       [facility] Follow up on 03/05/2018.   Specialty:  Cardiothoracic Surgery Why:  Appointment is at 2:30, please get CXR at 2:00 at [place] located on first floor of our ofofice building Contact information: [place], [place] Follow up.   Why:  rolling walker Contact information: [address] [place] [address] [phone]           Signed:  Original Note By:  Ezzard Flax PA-C  Update by: Ellwood Handler, PA-C 02/10/2018, 7:57 AM

## 2018-02-08 LAB — CBC
HCT: 28 % — ABNORMAL LOW (ref 39.0–52.0)
Hemoglobin: 9.4 g/dL — ABNORMAL LOW (ref 13.0–17.0)
MCH: 32.4 pg (ref 26.0–34.0)
MCHC: 33.6 g/dL (ref 30.0–36.0)
MCV: 96.6 fL (ref 80.0–100.0)
NRBC: 0 % (ref 0.0–0.2)
Platelets: 122 10*3/uL — ABNORMAL LOW (ref 150–400)
RBC: 2.9 MIL/uL — ABNORMAL LOW (ref 4.22–5.81)
RDW: 14.7 % (ref 11.5–15.5)
WBC: 9.2 10*3/uL (ref 4.0–10.5)

## 2018-02-08 LAB — BASIC METABOLIC PANEL
Anion gap: 8 (ref 5–15)
Anion gap: 7 (ref 5–15)
BUN: 22 mg/dL (ref 8–23)
BUN: 21 mg/dL (ref 8–23)
CHLORIDE: 102 mmol/L (ref 98–111)
CO2: 24 mmol/L (ref 22–32)
CO2: 26 mmol/L (ref 22–32)
Calcium: 7.8 mg/dL — ABNORMAL LOW (ref 8.9–10.3)
Calcium: 7.8 mg/dL — ABNORMAL LOW (ref 8.9–10.3)
Chloride: 102 mmol/L (ref 98–111)
Creatinine, Ser: 1.12 mg/dL (ref 0.61–1.24)
Creatinine, Ser: 1.12 mg/dL (ref 0.61–1.24)
GFR calc Af Amer: >60 mL/min (ref >60)
GFR calc Af Amer: >60 mL/min (ref >60)
GFR calc non Af Amer: >60 mL/min (ref >60)
GFR calc non Af Amer: >60 mL/min (ref >60)
Glucose, Bld: 102 mg/dL — ABNORMAL HIGH (ref 70–99)
Glucose, Bld: 121 mg/dL — ABNORMAL HIGH (ref 70–99)
POTASSIUM: 4 mmol/L (ref 3.5–5.1)
Potassium: 4.8 mmol/L (ref 3.5–5.1)
Sodium: 134 mmol/L — ABNORMAL LOW (ref 135–145)
Sodium: 135 mmol/L (ref 135–145)

## 2018-02-08 LAB — GLUCOSE, CAPILLARY: Glucose-Capillary: 116 mg/dL — ABNORMAL HIGH (ref 70–99)

## 2018-02-08 MED ORDER — AMIODARONE HCL 200 MG PO TABS
400.0000 mg | ORAL_TABLET | Freq: Two times a day (BID) | ORAL | Status: DC
  Administered 2018-02-08 – 2018-02-10 (×5): 400 mg via ORAL
  Filled 2018-02-08 (×5): qty 2

## 2018-02-08 MED ORDER — AMIODARONE HCL IN DEXTROSE 360-4.14 MG/200ML-% IV SOLN
60.0000 mg/h | INTRAVENOUS | Status: AC
  Administered 2018-02-08 (×2): 60 mg/h via INTRAVENOUS
  Filled 2018-02-08 (×2): qty 200

## 2018-02-08 MED ORDER — SODIUM CHLORIDE 0.9 % IV SOLN: 250.0000 mL | INTRAVENOUS | Status: DC | PRN

## 2018-02-08 MED ORDER — OXYCODONE HCL 5 MG PO TABS
5.0000 mg | ORAL_TABLET | ORAL | Status: DC | PRN
  Filled 2018-02-08: qty 2

## 2018-02-08 MED ORDER — PANTOPRAZOLE SODIUM 40 MG PO TBEC
40.0000 mg | DELAYED_RELEASE_TABLET | Freq: Every day | ORAL | Status: DC
  Administered 2018-02-09 – 2018-02-10 (×2): 40 mg via ORAL
  Filled 2018-02-08 (×2): qty 1

## 2018-02-08 MED ORDER — AMIODARONE HCL IN DEXTROSE 360-4.14 MG/200ML-% IV SOLN: 30.0000 mg/h | INTRAVENOUS | Status: DC

## 2018-02-08 MED ORDER — ACETAMINOPHEN 325 MG PO TABS
650.0000 mg | ORAL_TABLET | Freq: Four times a day (QID) | ORAL | Status: DC | PRN
  Administered 2018-02-08 – 2018-02-09 (×2): 650 mg via ORAL
  Filled 2018-02-08 (×2): qty 2

## 2018-02-08 MED ORDER — MOVING RIGHT ALONG BOOK
Freq: Once | Status: AC
  Administered 2018-02-08: 1
  Filled 2018-02-08: qty 1

## 2018-02-08 MED ORDER — SODIUM CHLORIDE 0.9% FLUSH: 3.0000 mL | INTRAVENOUS | Status: DC | PRN

## 2018-02-08 MED ORDER — SODIUM CHLORIDE 0.9% FLUSH
3.0000 mL | Freq: Two times a day (BID) | INTRAVENOUS | Status: DC
  Administered 2018-02-08 – 2018-02-09 (×4): 3 mL via INTRAVENOUS

## 2018-02-08 MED ORDER — MAGNESIUM HYDROXIDE 400 MG/5ML PO SUSP: 15.0000 mL | Freq: Every day | ORAL | Status: DC | PRN

## 2018-02-08 MED ORDER — AMIODARONE IV BOLUS ONLY 150 MG/100ML
150.0000 mg | Freq: Once | INTRAVENOUS | Status: AC
  Administered 2018-02-08: 150 mg via INTRAVENOUS

## 2018-02-08 MED ORDER — METOPROLOL TARTRATE 25 MG PO TABS
25.0000 mg | ORAL_TABLET | Freq: Two times a day (BID) | ORAL | Status: DC
  Administered 2018-02-08 – 2018-02-10 (×5): 25 mg via ORAL
  Filled 2018-02-08 (×5): qty 1

## 2018-02-08 MED ORDER — ONDANSETRON HCL 4 MG PO TABS: 4.0000 mg | ORAL_TABLET | Freq: Four times a day (QID) | ORAL | Status: DC | PRN

## 2018-02-08 MED ORDER — ASPIRIN EC 325 MG PO TBEC
325.0000 mg | DELAYED_RELEASE_TABLET | Freq: Every day | ORAL | Status: DC
  Administered 2018-02-08 – 2018-02-10 (×3): 325 mg via ORAL
  Filled 2018-02-08 (×3): qty 1

## 2018-02-08 MED ORDER — ONDANSETRON HCL 4 MG/2ML IJ SOLN: 4.0000 mg | Freq: Four times a day (QID) | INTRAMUSCULAR | Status: DC | PRN

## 2018-02-08 MED ORDER — TRAMADOL HCL 50 MG PO TABS: 50.0000 mg | ORAL_TABLET | Freq: Four times a day (QID) | ORAL | Status: DC | PRN

## 2018-02-08 MED FILL — Sodium Chloride IV Soln 0.9%: INTRAVENOUS | Qty: 2000 | Status: AC

## 2018-02-08 MED FILL — Sodium Bicarbonate IV Soln 8.4%: INTRAVENOUS | Qty: 50 | Status: AC

## 2018-02-08 MED FILL — Electrolyte-R (PH 7.4) Solution: INTRAVENOUS | Qty: 4000 | Status: AC

## 2018-02-08 MED FILL — Heparin Sodium (Porcine) Inj 1000 Unit/ML: INTRAMUSCULAR | Qty: 30 | Status: AC

## 2018-02-08 MED FILL — Mannitol IV Soln 20%: INTRAVENOUS | Qty: 500 | Status: AC

## 2018-02-08 NOTE — Progress Notes (Signed)
3 Days Post-Op Procedure(s) (LRB): CORONARY ARTERY BYPASS GRAFTING (CABG) x4 USING BILATERAL IMA'S WITH LEFT GREATER SAPHENOUS VEIN HARVEST AND EXPLORATION OF RIGHT SAPHENOUS (N/A) TRANSESOPHAGEAL ECHOCARDIOGRAM (TEE) (N/A) Subjective: No complaints. Ambulated this am Went into atrial fib with RVR overnight and started on amio IV with no bolus. Still in atrial fib with rate 110.  Objective: Vital signs in last 24 hours: Temp:  [97.5 F (36.4 C)-98.2 F (36.8 C)] 98.1 F (36.7 C) (01/23 0748) Pulse Rate:  [66-135] 110 (01/23 0800) Cardiac Rhythm: Atrial fibrillation (01/23 0400) Resp:  [13-24] 18 (01/23 0800) BP: (88-137)/(48-73) 113/61 (01/23 0800) SpO2:  [89 %-99 %] 92 % (01/23 0800) Weight:  [80.2 kg] 80.2 kg (01/23 0500)  Hemodynamic parameters for last 24 hours:    Intake/Output from previous day: 01/22 0701 - 01/23 0700 In: 863.2 [P.O.:770; I.V.:93.2] Out: 1310 [Urine:1310] Intake/Output this shift: Total I/O In: 33.3 [I.V.:33.3] Out: -   General appearance: alert and cooperative Heart: irregularly irregular rhythm Lungs: clear to auscultation bilaterally Extremities: extremities normal, atraumatic, no cyanosis or edema Wound: dressings dry  Lab Results: Recent Labs    02/07/18 0455 02/08/18 0314  WBC 10.5 9.2  HGB 9.4* 9.4*  HCT 30.0* 28.0*  PLT 127* 122*   BMET:  Recent Labs    02/07/18 0455 02/08/18 0314  NA 134* 134*  K 4.4 4.0  CL 102 102  CO2 25 24  GLUCOSE 130* 102*  BUN 29* 22  CREATININE 1.67* 1.12  CALCIUM 7.9* 7.8*    PT/INR:  Recent Labs    02/05/18 1500  LABPROT 17.4*  INR 1.44   ABG    Component Value Date/Time   PHART 7.354 02/05/2018 1904   HCO3 22.3 02/05/2018 1904   TCO2 25 02/05/2018 1942   ACIDBASEDEF 3.0 (H) 02/05/2018 1904   O2SAT 95.0 02/05/2018 1904   CBG (last 3)  Recent Labs    02/07/18 1141 02/07/18 2215 02/08/18 0657  GLUCAP 103* 122* 116*    Assessment/Plan: S/P Procedure(s) (LRB): CORONARY  ARTERY BYPASS GRAFTING (CABG) x4 USING BILATERAL IMA'S WITH LEFT GREATER SAPHENOUS VEIN HARVEST AND EXPLORATION OF RIGHT SAPHENOUS (N/A) TRANSESOPHAGEAL ECHOCARDIOGRAM (TEE) (N/A)  POD 3  Hemodynamically stable in atrial fib. Continue Lopressor. Hold off on ARB for now. His creat is back to baseline but his BP is low normal.  Postop atrial fib: continue IV amio. Will give a bolus this am.  Continue IS, ambulation.  ASA and Plavix at discharge  Will hold off transferring until AF is controlled today.   LOS: 6 days    Gaye Pollack 02/08/2018

## 2018-02-08 NOTE — Progress Notes (Signed)
      [address]       [place] [address]             [phone]      POD # 3 CABG  BP (!) 113/54 (BP Location: Right Arm)   Pulse 77   Temp 98.2 F (36.8 C) (Oral)   Resp 16   Ht 5\' 7"  (1.702 m)   Wt 80.2 kg   SpO2 96%   BMI 27.69 kg/m  Back in SR on PO amiodarone  Intake/Output Summary (Last 24 hours) at 02/08/2018 1903 Last data filed at 02/08/2018 1800 Gross per 24 hour  Intake 1568.67 ml  Output 875 ml  Net 693.67 ml   Awaiting bed on 4E  Winola Drum C. Roxan Hockey, MD [facility] [phone]

## 2018-02-09 MED FILL — Mannitol IV Soln 20%: INTRAVENOUS | Qty: 500 | Status: CN

## 2018-02-09 MED FILL — Heparin Sodium (Porcine) Inj 1000 Unit/ML: INTRAMUSCULAR | Qty: 30 | Status: AC

## 2018-02-09 MED FILL — Electrolyte-R (PH 7.4) Solution: INTRAVENOUS | Qty: 4000 | Status: CN

## 2018-02-09 MED FILL — Lidocaine HCl(Cardiac) IV PF Soln Pref Syr 100 MG/5ML (2%): INTRAVENOUS | Qty: 5 | Status: AC

## 2018-02-09 MED FILL — Sodium Chloride IV Soln 0.9%: INTRAVENOUS | Qty: 2000 | Status: CN

## 2018-02-09 MED FILL — Sodium Bicarbonate IV Soln 8.4%: INTRAVENOUS | Qty: 50 | Status: AC

## 2018-02-09 NOTE — Care Management Note (Signed)
Case Management Note  Patient Details  Name: Alex Martin MRN: [id number] Date of Birth: 18-May-1940  Subjective/Objective: 78 yo male presented with substernal CP and non-STEMI; s/p CABG 02/05/18.                   Action/Plan: CM met with patient/significant other to discuss dispositional needs. Patient lived at home and was independent with his ADLs with no AD in use. PCP verified as: Dr. Jenna Luo; pharmacy of choice: [facility]. Cardiac Rehab has recommended a RW with patient agreeable. DME preference list was provided with [facility] selected; AVS updated. RW will be arranged and delivered prior to patient transitioning home. Patients significant other indicated the patient will reside with her and she will be available to provide assistance. No further needs from CM at this time.   Expected Discharge Date:                  Expected Discharge Plan:  Home/Self Care  In-House Referral:  NA  Discharge planning Services  CM Consult  Post Acute Care Choice:  Durable Medical Equipment Choice offered to:  Patient(Significant Other)  DME Arranged:  Gilford Rile rolling DME Agency:  [place]:  NA [place] Agency:  NA  Status of Service:  In process, will continue to follow  If discussed at Long Length of Stay Meetings, dates discussed:    Additional Comments:  Midge Minium RN, BSN, NCM-BC, ACM-RN [phone] 02/09/2018, 3:40 PM

## 2018-02-09 NOTE — Progress Notes (Signed)
Transported to 4E17.  Ambulated to bedside.  Adrienne,RN at bedside.  Patient awake, alert and orient w/family at beside also.

## 2018-02-09 NOTE — Progress Notes (Signed)
Patient received from [place], A&Ox4, VSS. Telemetry applied and CCMD notified. Oriented to room and call light within reach.

## 2018-02-09 NOTE — Plan of Care (Signed)
  Problem: Education: Goal: Knowledge of General Education information will improve Description: Including pain rating scale, medication(s)/side effects and non-pharmacologic comfort measures Outcome: Progressing   Problem: Health Behavior/Discharge Planning: Goal: Ability to manage health-related needs will improve Outcome: Progressing   Problem: Clinical Measurements: Goal: Will remain free from infection Outcome: Progressing   Problem: Elimination: Goal: Will not experience complications related to bowel motility Outcome: Progressing   Problem: Pain Managment: Goal: General experience of comfort will improve Outcome: Progressing   

## 2018-02-09 NOTE — Progress Notes (Signed)
4 Days Post-Op Procedure(s) (LRB): CORONARY ARTERY BYPASS GRAFTING (CABG) x4 USING BILATERAL IMA'S WITH LEFT GREATER SAPHENOUS VEIN HARVEST AND EXPLORATION OF RIGHT SAPHENOUS (N/A) TRANSESOPHAGEAL ECHOCARDIOGRAM (TEE) (N/A) Subjective: No specific complaints. Did not take any pain med last night and did not sleep well.  Maintaining sinus rhythm.  Objective: Vital signs in last 24 hours: Temp:  [97.6 F (36.4 C)-98.3 F (36.8 C)] 98.3 F (36.8 C) (01/24 0000) Pulse Rate:  [65-117] 71 (01/24 0600) Cardiac Rhythm: Normal sinus rhythm (01/24 0500) Resp:  [16-21] 16 (01/23 0930) BP: (100-138)/(50-84) 117/56 (01/24 0334) SpO2:  [91 %-96 %] 95 % (01/24 0600) Weight:  [81.7 kg] 81.7 kg (01/24 0500)  Hemodynamic parameters for last 24 hours:    Intake/Output from previous day: 01/23 0701 - 01/24 0700 In: 1475.5 [P.O.:1200; I.V.:275.5] Out: 400 [Urine:400] Intake/Output this shift: No intake/output data recorded.  General appearance: alert and cooperative Heart: regular rate and rhythm, S1, S2 normal, no murmur, click, rub or gallop Lungs: clear to auscultation bilaterally Extremities: extremities normal, atraumatic, no cyanosis or edema Wound: incisions ok  Lab Results: Recent Labs    02/07/18 0455 02/08/18 0314  WBC 10.5 9.2  HGB 9.4* 9.4*  HCT 30.0* 28.0*  PLT 127* 122*   BMET:  Recent Labs    02/08/18 0314 02/08/18 1034  NA 134* 135  K 4.0 4.8  CL 102 102  CO2 24 26  GLUCOSE 102* 121*  BUN 22 21  CREATININE 1.12 1.12  CALCIUM 7.8* 7.8*    PT/INR: No results for input(s): LABPROT, INR in the last 72 hours. ABG    Component Value Date/Time   PHART 7.354 02/05/2018 1904   HCO3 22.3 02/05/2018 1904   TCO2 25 02/05/2018 1942   ACIDBASEDEF 3.0 (H) 02/05/2018 1904   O2SAT 95.0 02/05/2018 1904   CBG (last 3)  Recent Labs    02/07/18 1141 02/07/18 2215 02/08/18 0657  GLUCAP 103* 122* 116*   ECG: sinus rhythm, QTc 431  Assessment/Plan: S/P  Procedure(s) (LRB): CORONARY ARTERY BYPASS GRAFTING (CABG) x4 USING BILATERAL IMA'S WITH LEFT GREATER SAPHENOUS VEIN HARVEST AND EXPLORATION OF RIGHT SAPHENOUS (N/A) TRANSESOPHAGEAL ECHOCARDIOGRAM (TEE) (N/A)  POD 4 Hemodynamically stable in sinus rhythm. Continue Lopressor and amio.  Postop atrial fib converted with IV amio. Now on po amio and tolerating well. Maintaining sinus rhythm. Will continue 400 bid today and send home tomorrow on 200 bid.   ASA and Plavix at discharge.  DC pacing wires today.  Weight is at preop.  Waiting on 4E bed.  Plan discharge tomorrow if maintaining sinus rhythm today.    LOS: 7 days    Alex Martin 02/09/2018

## 2018-02-09 NOTE — Progress Notes (Signed)
CARDIAC REHAB PHASE I   PRE:  Rate/Rhythm: 78 SR  BP:  Supine:   Sitting: 139/71  Standing:    SaO2: 93%RA  MODE:  Ambulation: 740 ft   POST:  Rate/Rhythm: 85 SR  BP:  Supine:   Sitting: 152/75  Standing:    SaO2: 93%RA 1050-1125 Pt walked 370 ft on RA holding to my hand and then 370 ft on RA with rolling walker. Pt felt steadier with the walker and would like one for home. To bathroom and then to recliner after walk. No IS in room. Pt stated he had one earlier but may have left on tray. Gave pt IS after looking in room and pt demonstrated 1250 ml correctly. Discussed CRP 2 and referred to [facility]. Pt would like family here for ed. Will have CR followup tomorrow to ed.    Graylon Good, RN BSN  02/09/2018 11:21 AM

## 2018-02-10 MED ORDER — METOPROLOL TARTRATE 25 MG PO TABS: 25.0000 mg | ORAL_TABLET | Freq: Two times a day (BID) | ORAL | 3 refills | Status: DC

## 2018-02-10 MED ORDER — LOSARTAN POTASSIUM 25 MG PO TABS: 25.0000 mg | ORAL_TABLET | Freq: Every day | ORAL | 3 refills | Status: DC

## 2018-02-10 MED ORDER — LOSARTAN POTASSIUM 25 MG PO TABS
25.0000 mg | ORAL_TABLET | Freq: Every day | ORAL | Status: DC
  Administered 2018-02-10: 25 mg via ORAL
  Filled 2018-02-10: qty 1

## 2018-02-10 MED ORDER — AMIODARONE HCL 200 MG PO TABS: 200.0000 mg | ORAL_TABLET | Freq: Two times a day (BID) | ORAL | 1 refills | Status: DC

## 2018-02-10 MED ORDER — CLOPIDOGREL BISULFATE 75 MG PO TABS: 75.0000 mg | ORAL_TABLET | Freq: Every day | ORAL | 3 refills | Status: DC

## 2018-02-10 MED ORDER — ATORVASTATIN CALCIUM 80 MG PO TABS: 80.0000 mg | ORAL_TABLET | Freq: Every day | ORAL | 3 refills | Status: DC

## 2018-02-10 MED ORDER — ACETAMINOPHEN 325 MG PO TABS: 650.0000 mg | ORAL_TABLET | Freq: Four times a day (QID) | ORAL | Status: DC | PRN

## 2018-02-10 MED ORDER — TRAMADOL HCL 50 MG PO TABS: 50.0000 mg | ORAL_TABLET | Freq: Four times a day (QID) | ORAL | 0 refills | Status: DC | PRN

## 2018-02-10 NOTE — Progress Notes (Addendum)
      [address]       [place] [address]             [phone]      5 Days Post-Op Procedure(s) (LRB): CORONARY ARTERY BYPASS GRAFTING (CABG) x4 USING BILATERAL IMA'S WITH LEFT GREATER SAPHENOUS VEIN HARVEST AND EXPLORATION OF RIGHT SAPHENOUS (N/A) TRANSESOPHAGEAL ECHOCARDIOGRAM (TEE) (N/A)   Subjective:  Doing well.  No complaints.  Ready to go home today.  + ambulation + BM  Objective: Vital signs in last 24 hours: Temp:  [97.4 F (36.3 C)-98.3 F (36.8 C)] 97.4 F (36.3 C) (01/25 0425) Pulse Rate:  [67-77] 71 (01/25 0425) Cardiac Rhythm: Normal sinus rhythm (01/24 1927) Resp:  [18-35] 35 (01/25 0618) BP: (117-145)/(55-71) 142/63 (01/25 0425) SpO2:  [94 %-97 %] 95 % (01/25 0425) Weight:  [81.3 kg] 81.3 kg (01/25 0618)   Intake/Output from previous day: 01/24 0701 - 01/25 0700 In: 422 [P.O.:422] Out: -   General appearance: alert, cooperative and no distress Heart: regular rate and rhythm Lungs: clear to auscultation bilaterally Abdomen: soft, non-tender; bowel sounds normal; no masses,  no organomegaly Extremities: edema trace Wound: clean and dry, ecchymosis present  Lab Results: Recent Labs    02/08/18 0314  WBC 9.2  HGB 9.4*  HCT 28.0*  PLT 122*   BMET:  Recent Labs    02/08/18 0314 02/08/18 1034  NA 134* 135  K 4.0 4.8  CL 102 102  CO2 24 26  GLUCOSE 102* 121*  BUN 22 21  CREATININE 1.12 1.12  CALCIUM 7.8* 7.8*    PT/INR: No results for input(s): LABPROT, INR in the last 72 hours. ABG    Component Value Date/Time   PHART 7.354 02/05/2018 1904   HCO3 22.3 02/05/2018 1904   TCO2 25 02/05/2018 1942   ACIDBASEDEF 3.0 (H) 02/05/2018 1904   O2SAT 95.0 02/05/2018 1904   CBG (last 3)  Recent Labs    02/07/18 1141 02/07/18 2215 02/08/18 0657  GLUCAP 103* 122* 116*    Assessment/Plan: S/P Procedure(s) (LRB): CORONARY ARTERY BYPASS GRAFTING (CABG) x4 USING BILATERAL IMA'S WITH LEFT GREATER SAPHENOUS VEIN HARVEST  AND EXPLORATION OF RIGHT SAPHENOUS (N/A) TRANSESOPHAGEAL ECHOCARDIOGRAM (TEE) (N/A)  1. CV- PAF, currently maintaining NSR- continue Lopressor, Cozaar, Amiodarone, Asa 81 mg start Plavix 2. Pulm- no acute issues, continue IS 3. Renal- creatinine WNL, no edema on exam, weight is at baseline, no lasix at this time 4. Dispo- patient stable, will d/c home today   LOS: 8 days    Ellwood Handler 02/10/2018

## 2018-02-10 NOTE — Progress Notes (Signed)
Discharge AVS meds take and those due reviewed with pt. Follow up appointments and when to call MD reviewed. All questions and concerns addressed. No further questions at this time. D/c IV and TELE, CCMD notified. D/C home per orders. Pt brought down via wheelchair with volunteer and family.

## 2018-02-10 NOTE — Progress Notes (Signed)
CARDIAC REHAB PHASE I   Visited with pt ~ 8:30am. Pt denied walk. Stated family would be back around 9:15, informed pt I would come back to complete education. Back at 9:30am. Started education with pt and wife at bedside. Reviewed Cardiac Rehab, restrictions, sternal precautions, IS use, heart healthy nutrition, NTG use (per wife's request), and exercise guidelines. Pt and wife verbalized understanding. Education completed.   [id number]  Carma Lair MS, ACSM CEP  9:54 AM 02/10/2018

## 2018-02-10 NOTE — Progress Notes (Signed)
Progress Note  Patient Name: Alex Martin Date of Encounter: 02/10/2018  Primary Cardiologist: Candee Furbish, MD    Subjective   No cardiac complaints home today  Inpatient Medications    Scheduled Meds: . amiodarone  400 mg Oral BID  . aspirin EC  325 mg Oral Daily  . atorvastatin  80 mg Oral q1800  . metoprolol tartrate  25 mg Oral BID  . pantoprazole  40 mg Oral QAC breakfast  . sodium chloride flush  3 mL Intravenous Q12H   Continuous Infusions: . sodium chloride     PRN Meds: sodium chloride, acetaminophen, magnesium hydroxide, ondansetron **OR** ondansetron (ZOFRAN) IV, oxyCODONE, sodium chloride flush, traMADol   Vital Signs    Vitals:   02/09/18 2028 02/10/18 0010 02/10/18 0425 02/10/18 0618  BP: 132/69 134/65 (!) 142/63   Pulse: 72 69 71   Resp: (!) 22 19 18  (!) 35  Temp: 98 F (36.7 C) 97.7 F (36.5 C) (!) 97.4 F (36.3 C)   TempSrc: Oral Oral Oral   SpO2: 94% 97% 95%   Weight:    81.3 kg  Height:        Intake/Output Summary (Last 24 hours) at 02/10/2018 0738 Last data filed at 02/10/2018 0026 Gross per 24 hour  Intake 422 ml  Output -  Net 422 ml   Last 3 Weights 02/10/2018 02/09/2018 02/08/2018  Weight (lbs) 179 lb 4.8 oz 180 lb 1.9 oz 176 lb 12.9 oz  Weight (kg) 81.33 kg 81.7 kg 80.2 kg      Telemetry    NSR no afib - Personally Reviewed  ECG    NSR nonspecific ST changes  - Personally Reviewed  Physical Exam  Comfortable sitting in chair  GEN: No acute distress.   Neck: No JVD Cardiac: RRR, no murmurs, rubs, or gallops. Post sternotomy Respiratory: Clear to auscultation bilaterally. GI: Soft, nontender, non-distended  MS: No edema; No deformity. Neuro:  Nonfocal  Psych: Normal affect   Labs    Chemistry Recent Labs  Lab 02/07/18 0455 02/08/18 0314 02/08/18 1034  NA 134* 134* 135  K 4.4 4.0 4.8  CL 102 102 102  CO2 25 24 26   GLUCOSE 130* 102* 121*  BUN 29* 22 21  CREATININE 1.67* 1.12 1.12  CALCIUM 7.9* 7.8* 7.8*    GFRNONAA 39* >60 >60  GFRAA 45* >60 >60  ANIONGAP 7 8 7      Hematology Recent Labs  Lab 02/06/18 1630 02/07/18 0455 02/08/18 0314  WBC 15.6* 10.5 9.2  RBC 3.42* 3.02* 2.90*  HGB 10.8* 9.4* 9.4*  HCT 34.1* 30.0* 28.0*  MCV 99.7 99.3 96.6  MCH 31.6 31.1 32.4  MCHC 31.7 31.3 33.6  RDW 15.0 15.0 14.7  PLT 140* 127* 122*    Cardiac EnzymesNo results for input(s): TROPONINI in the last 168 hours. No results for input(s): TROPIPOC in the last 168 hours.   BNPNo results for input(s): BNP, PROBNP in the last 168 hours.   DDimer No results for input(s): DDIMER in the last 168 hours.   Radiology    No results found.  Cardiac Studies   TTE EF 30-35%  Patient Profile     78 y.o. male post cabg with severe 3 vessel disease and ischemic DCM  Assessment & Plan    CABG  Stable good pain control pacing wires out PAF:  D/c on amiodarone 200 bid in NSR niow CHF:  Will need MRI/TTE in 3 months to assess EF and hopefully some  recovery will Start ACE  [facility] will sign off.   Medication Recommendations:  Start cozaar 25 mg amiodarone 200 bid Other recommendations (labs, testing, etc):  F/u Dr Marlou Porch  Follow up as an outpatient:  [facility]  For questions or updates, please contact [facility] Please consult [email or web address] for contact info under        Signed, Jenkins Rouge, MD  02/10/2018, 7:38 AM

## 2018-02-14 ENCOUNTER — Telehealth (HOSPITAL_COMMUNITY): Payer: Self-pay

## 2018-02-14 NOTE — Telephone Encounter (Signed)
Attempted to call patient in regards to Cardiac Rehab - LM on VM 

## 2018-02-14 NOTE — Telephone Encounter (Signed)
Pt insurance is active and benefits verified through [facility]. Co-pay $20.00, DED $0.00/$0.00 met, out of pocket $3,000.00/$0.00 met, co-insurance 0%. No pre-authorization required. Passport, 02/14/2018 @ 9:17AM, REF# [id number]  Will contact patient to see if he is interested in the Cardiac Rehab Program. If interested, patient will need to complete follow up appt. Once completed, patient will be contacted for scheduling upon review by the RN Navigator.

## 2018-02-14 NOTE — Telephone Encounter (Signed)
Pt returned CR phone call and stated he would like to participate in our CR program. Explained scheduling process and went over insurance, patient verbalized understanding. Will contact patient for scheduling once f/u has been completed.

## 2018-02-16 ENCOUNTER — Other Ambulatory Visit: Payer: Self-pay

## 2018-02-16 ENCOUNTER — Telehealth: Payer: Self-pay

## 2018-02-16 ENCOUNTER — Ambulatory Visit
Admission: RE | Admit: 2018-02-16 | Discharge: 2018-02-16 | Disposition: A | Discharge status: Home / Self Care | Payer: Medicare Other | Source: Ambulatory Visit | Attending: Surgery | Admitting: Surgery

## 2018-02-16 ENCOUNTER — Ambulatory Visit (INDEPENDENT_AMBULATORY_CARE_PROVIDER_SITE_OTHER): Payer: Self-pay

## 2018-02-16 DIAGNOSIS — Z951 Presence of aortocoronary bypass graft: Secondary | ICD-10-CM

## 2018-02-16 DIAGNOSIS — R0602 Shortness of breath: Secondary | ICD-10-CM

## 2018-02-16 DIAGNOSIS — Z4802 Encounter for removal of sutures: Secondary | ICD-10-CM

## 2018-02-16 NOTE — Telephone Encounter (Signed)
Patient notified of CXR results. He was instructed to continue using the incentive spirometer daily, keep legs elevated when sitting and to walk everyday. He is scheduled to see cardiology next week and Dr Cyndia Bent the following week.

## 2018-02-16 NOTE — Progress Notes (Signed)
Removed 4 sutures from chest tube sites no signs of infection. There was some mild redness at distal end of sternal incision with scabbing. Patient denied any pain or drainage from site. He was instructed to watch the area closely and if redness increases or develops any heat or fevers to call our office for appt. He was c/o shortness of breath with exertion and some swelling in bi lat feet and ankles. No pitting. He is not taking any fluid pills at this time. He was sent to [facility] for CXR today and will inform Dr Cyndia Bent and call him later to day with recommendations.

## 2018-02-17 ENCOUNTER — Encounter: Payer: Self-pay | Admitting: Thoracic Surgery (Cardiothoracic Vascular Surgery)

## 2018-02-17 ENCOUNTER — Telehealth: Payer: Self-pay | Admitting: Thoracic Surgery (Cardiothoracic Vascular Surgery)

## 2018-02-17 NOTE — Telephone Encounter (Signed)
Patient called complaining of several side effects that seem to occur after he takes medications, particularly amiodarone.  He had brief episode PAF early after surgery which converted to NSR on amiodarone  I instructed patient to stop taking amiodarone for now  Rexene Alberts, MD 02/17/2018 9:51 AM

## 2018-02-19 ENCOUNTER — Other Ambulatory Visit: Payer: Self-pay

## 2018-02-19 DIAGNOSIS — R600 Localized edema: Secondary | ICD-10-CM

## 2018-02-19 MED ORDER — POTASSIUM CHLORIDE ER 10 MEQ PO TBCR: 20.0000 meq | EXTENDED_RELEASE_TABLET | Freq: Two times a day (BID) | ORAL | 2 refills | Status: DC

## 2018-02-19 MED ORDER — FUROSEMIDE 40 MG PO TABS: 40.0000 mg | ORAL_TABLET | Freq: Every day | ORAL | 0 refills | Status: DC

## 2018-02-19 NOTE — Progress Notes (Signed)
RX for Lasix 40 mg daily and KDur 20 meq BID for 7 days sent to [facility]. Patient is aware

## 2018-02-20 ENCOUNTER — Encounter: Payer: Self-pay | Admitting: Cardiology

## 2018-02-21 ENCOUNTER — Ambulatory Visit: Payer: Medicare Other | Admitting: Nurse Practitioner

## 2018-02-21 ENCOUNTER — Encounter: Payer: Self-pay | Admitting: Nurse Practitioner

## 2018-02-21 ENCOUNTER — Ambulatory Visit
Admission: RE | Admit: 2018-02-21 | Discharge: 2018-02-21 | Disposition: A | Discharge status: Home / Self Care | Payer: Medicare Other | Source: Ambulatory Visit | Attending: Nurse Practitioner | Admitting: Nurse Practitioner

## 2018-02-21 VITALS — BP 138/70 | HR 55 | Ht 67.0 in | Wt 177.1 lb

## 2018-02-21 DIAGNOSIS — I214 Non-ST elevation (NSTEMI) myocardial infarction: Secondary | ICD-10-CM

## 2018-02-21 DIAGNOSIS — I259 Chronic ischemic heart disease, unspecified: Secondary | ICD-10-CM

## 2018-02-21 DIAGNOSIS — Z951 Presence of aortocoronary bypass graft: Secondary | ICD-10-CM

## 2018-02-21 DIAGNOSIS — E7849 Other hyperlipidemia: Secondary | ICD-10-CM

## 2018-02-21 DIAGNOSIS — I10 Essential (primary) hypertension: Secondary | ICD-10-CM

## 2018-02-21 NOTE — Progress Notes (Signed)
CARDIOLOGY OFFICE NOTE  Date:  02/21/2018    Alex Martin Date of Birth: [birth year] Medical Record #[id number]  PCP:  Susy Frizzle, MD  Cardiologist:  [facility]   Chief Complaint  Patient presents with  . Coronary Artery Disease    Post hospital visit - seen for Dr. Marlou Porch    History of Present Illness: Alex Martin is a 78 y.o. male who presents today for a post hospital visit. Seen for Dr. Marlou Porch (NEW).  He has a history of HTN, HLD and +FH for CAD. Remote smoker.   He presented last month with chest pain - found to have NSTEMI - cath showed severe 3VD along with significant LV dysfunction - EF of 25 to 35% by visual estimate. TCTS was consulted. He underwent CABG x 4 per Dr. Cyndia Bent with LIMA to DX, free RIMA to OM, SVG to PD & PL. Post op course was uncomplicated. He did have AF with RVR and was placed on amiodarone. ARB was started along with Plavix (for ACS). Will need follow up studies in 3 months to recheck EF.   Comes in today. Here with his daughter and girlfriend. He needed a wheelchair to come in. Has been home about 2 weeks. Several concerns. One is over coughing. Not really productive - may be worse with lying down.  To see PCP tomorrow - asking about keeping this or not. Was told by pharmacist that taking Vesicare with potassium would make his stomach hurt - he does not have stomach pain. However, this could cause the potassium to not work effectively. His amiodarone was stopped this past weekend due to a multitude of side effects - hallucinating/reaching for things that were not there, etc. He had a pretty heavy use of alcohol prior - one bottle of wine each night. He is not drinking at this time. He is not sleeping. He gets short of breath with little activity. Both legs are swelling. Weight is about the same. He has been placed on Lasix and potassium.   Past Medical History:  Diagnosis Date  . Abdominal distension    UMBILICAL HERNIA-NO PAIN  . Bleeding  from the nose    SEVERAL EPISODES THAT REQUIRED TX IN ER--LAST EPISODE WAS APPROX 1 YR AGO  . Blood in urine    MICROSCOPIC BLOODEVALUATED BY DR. NESI FRIDAY 02/11/11--URINE SAMPLE AND EXAM-- AND BLOOD SAMPLE AND TO BE SEEN AGAIN IN 6 MONTHS AND GIVEN VESICARE  FOR FREQUENCY AND URGENCY  . GERD (gastroesophageal reflux disease)   . Hypertension     Past Surgical History:  Procedure Laterality Date  . CORONARY ARTERY BYPASS GRAFT N/A 02/05/2018   Procedure: CORONARY ARTERY BYPASS GRAFTING (CABG) x4 USING BILATERAL IMA'S WITH LEFT GREATER SAPHENOUS VEIN HARVEST AND EXPLORATION OF RIGHT SAPHENOUS;  Surgeon: Gaye Pollack, MD;  Location: [facility];  Service: Open Heart Surgery;  Laterality: N/A;  . English  . LEFT HEART CATH AND CORONARY ANGIOGRAPHY N/A 02/02/2018   Procedure: LEFT HEART CATH AND CORONARY ANGIOGRAPHY;  Surgeon: Martinique, Peter M, MD;  Location: [facility];  Service: Cardiovascular;  Laterality: N/A;  . TEE WITHOUT CARDIOVERSION N/A 02/05/2018   Procedure: TRANSESOPHAGEAL ECHOCARDIOGRAM (TEE);  Surgeon: Gaye Pollack, MD;  Location: [facility];  Service: Open Heart Surgery;  Laterality: N/A;  . UMBILICAL HERNIA REPAIR  02/15/2011   Procedure: HERNIA REPAIR UMBILICAL ADULT;  Surgeon: Odis Hollingshead, MD;  Location: [facility];  Service: General;  Laterality: N/A;  umbilical hernia repair with mesh  . VASECTOMY  1976     Medications: Current Meds  Medication Sig  . acetaminophen (TYLENOL) 325 MG tablet Take 2 tablets (650 mg total) by mouth every 6 (six) hours as needed for mild pain.  Marland Kitchen aspirin EC 81 MG tablet Take 81 mg by mouth daily.  Marland Kitchen atorvastatin (LIPITOR) 80 MG tablet Take 1 tablet (80 mg total) by mouth daily at 6 PM.  . clopidogrel (PLAVIX) 75 MG tablet Take 1 tablet (75 mg total) by mouth daily.  . Cyanocobalamin (VITAMIN B-12 PO) Take 1 tablet by mouth daily.  . furosemide (LASIX) 40 MG tablet Take 1 tablet (40 mg total) by mouth daily.  Marland Kitchen  latanoprost (XALATAN) 0.005 % ophthalmic solution Place 1 drop into both eyes at bedtime.   Marland Kitchen losartan (COZAAR) 25 MG tablet Take 1 tablet (25 mg total) by mouth daily.  . metoprolol tartrate (LOPRESSOR) 25 MG tablet Take 1 tablet (25 mg total) by mouth 2 (two) times daily.  . Multiple Vitamin (MULTIVITAMIN) tablet Take 1 tablet by mouth daily.  Marland Kitchen NEXIUM 40 MG capsule TAKE 1 CAPSULE (40 MG TOTAL) BY MOUTH DAILY. (Patient taking differently: Take 40 mg by mouth daily. )  . potassium chloride (K-DUR) 10 MEQ tablet Take 2 tablets (20 mEq total) by mouth 2 (two) times daily.  . timolol (TIMOPTIC) 0.5 % ophthalmic solution Place 1 drop into both eyes daily.  . traMADol (ULTRAM) 50 MG tablet Take 1 tablet (50 mg total) by mouth every 6 (six) hours as needed for moderate pain.  Marland Kitchen triamcinolone cream (KENALOG) 0.1 % Apply 1 application topically 2 (two) times daily as needed.  . valACYclovir (VALTREX) 1000 MG tablet Take 2 tablets (2,000 mg total) by mouth 2 (two) times daily. X 1 day  . [DISCONTINUED] VESICARE 5 MG tablet TAKE 1 TABLET BY MOUTH EVERY DAY (Patient taking differently: Take 5 mg by mouth daily. )     Allergies: Allergies  Allergen Reactions  . Amlodipine Swelling    swelling in ankles  . Shellfish Allergy Other (See Comments)    N&V AFTER EATING MUSSELS    Social History: The patient  reports that he quit smoking about 26 years ago. He quit after 10.00 years of use. He has never used smokeless tobacco. He reports current alcohol use. He reports that he does not use drugs.   Family History: The patient's family history includes Cancer in his sister; Heart disease in his father and mother.   Review of Systems: Please see the history of present illness.   Otherwise, the review of systems is positive for none.   All other systems are reviewed and negative.   Physical Exam: VS:  BP 138/70 (BP Location: Left Arm, Patient Position: Sitting, Cuff Size: Normal)   Pulse (!) 55   Ht 5'  7" (1.702 m)   Wt 177 lb 1.9 oz (80.3 kg)   BMI 27.74 kg/m  .  BMI Body mass index is 27.74 kg/m.  Wt Readings from Last 3 Encounters:  02/21/18 177 lb 1.9 oz (80.3 kg)  02/10/18 179 lb 4.8 oz (81.3 kg)  11/14/17 175 lb (79.4 kg)    General: Looks a little frail. Color is sallow. Alert and in no acute distress.   HEENT: Normal.  Neck: Supple, no JVD, carotid bruits, or masses noted.  Cardiac: Regular rate and rhythm. No murmurs, rubs, or gallops. Sternum is ok - some delayed healing. +  1 edema bilaterally.  Respiratory:  Lungs with few rales in the bases.  GI: Soft and nontender.  MS: No deformity or atrophy. Gait and ROM intact.  Skin: Warm and dry. Color is normal.  Neuro:  Strength and sensation are intact and no gross focal deficits noted.  Psych: Alert, appropriate and with normal affect.   LABORATORY DATA:  EKG:  EKG is ordered today. This demonstrates sinus brady with evolving changes - diffuse T wave changes  Lab Results  Component Value Date   WBC 9.2 02/08/2018   HGB 9.4 (L) 02/08/2018   HCT 28.0 (L) 02/08/2018   PLT 122 (L) 02/08/2018   GLUCOSE 121 (H) 02/08/2018   CHOL 194 02/03/2018   TRIG 127 02/03/2018   HDL 59 02/03/2018   LDLCALC 110 (H) 02/03/2018   ALT 15 11/07/2017   AST 17 11/07/2017   NA 135 02/08/2018   K 4.8 02/08/2018   CL 102 02/08/2018   CREATININE 1.12 02/08/2018   BUN 21 02/08/2018   CO2 26 02/08/2018   TSH 3.65 06/18/2015   PSA 0.4 11/07/2017   INR 1.44 02/05/2018   HGBA1C 5.5 02/02/2018     BNP (last 3 results) No results for input(s): BNP in the last 8760 hours.  ProBNP (last 3 results) No results for input(s): PROBNP in the last 8760 hours.   Other Studies Reviewed Today:  Procedure:  1. Median Sternotomy 2. Extracorporeal circulation 3. Coronary artery bypass grafting x 4   Left internal mammaryarterygraft to thediagonal branch of theLAD  Free right internal mammary artery graft to the OM  Sequential  SVG to PDA and PL.  4. Endoscopic vein harvest from the leftleg   Left Heart Cath 02/02/18  Diagnostic  Dominance: Right  Left Main  Dist LM lesion 50% stenosed  Dist LM lesion is 50% stenosed. The lesion is discrete.  Left Anterior Descending  There is a dual LAD with a large primarily septal branch and a large diagonal branch.  Prox LAD lesion 95% stenosed  Prox LAD lesion is 95% stenosed.  Left Circumflex  Ost Cx to Prox Cx lesion 90% stenosed  Ost Cx to Prox Cx lesion is 90% stenosed.  Right Coronary Artery  Mid RCA lesion 75% stenosed  Mid RCA lesion is 75% stenosed.  Right Posterior Descending Artery  Ost RPDA to RPDA lesion 90% stenosed  Ost RPDA to RPDA lesion is 90% stenosed.     Echo Study Conclusions 01/2018  - Left ventricle: The cavity size was normal. Wall thickness was increased in a pattern of moderate LVH. Basal to mid inferoseptal and anteroseptal akinesis. Apical lateral akinesis. Akinesis of the true apex. Apical anterior akinesis. Apical inferior akinesis. Systolic function was moderately to severely reduced. The estimated ejection fraction was in the range of 30% to 35%. Doppler parameters are consistent with abnormal left ventricular relaxation (grade 1 diastolic dysfunction). - Aortic valve: There was no stenosis. There was trivial regurgitation. - Mitral valve: Mildly calcified annulus. Mildly calcified leaflets . There was no significant regurgitation. - Right ventricle: The cavity size was normal. Systolic function was normal. - Tricuspid valve: Peak RV-RA gradient (S): 37 mm Hg. - Pulmonary arteries: PA peak pressure: 40 mm Hg (S). - Inferior vena cava: The vessel was normal in size. The respirophasic diameter changes were in the normal range (>= 50%), consistent with normal central venous pressure.  Impressions:  - Normal LV size with moderate LV hypertrophy. EF 30-35%, wall motion abnormalities in the  pattern  of a LAD infarct. Normal RV size and systolic function. No significant valvular abnormalities. Mild pulmonary hypertension.   Assessment/Plan:  1. Post op CABG x 4 with LIMA to DX, free RIMA to OM & SVG to PD/PL - very slow progress - I suspect due to heart failure. Will go ahead and get CXR today. Check lab today. He is not ready for rehab. Try to transition off Tramadol. Stopping Vesicare due to drug interaction potential. Further disposition to follow. He will need close follow up.   2. Recent NSTEMI - see above. He is on Plavix therapy.   3. Significant LV dysfunction/ICM  See above - will try to increase ARB. Will need follow up echo in 3 months.   4. HTN - BP is ok. Hope to increase ARB  5. HLD - on statin  6. Alcohol use - total cessation encouraged.   7. PAF - in sinus - no longer on amiodarone. Would follow - I suspect his "side effects" were more alcohol withdrawal.   Current medicines are reviewed with the patient today.  The patient does not have concerns regarding medicines other than what has been noted above.  The following changes have been made:  See above.  Labs/ tests ordered today include:    Orders Placed This Encounter  Procedures  . DG Chest 2 View  . Basic metabolic panel  . CBC  . Pro b natriuretic peptide (BNP)  . EKG 12-Lead     Disposition:   FU with [place] in about 2 weeks.   Patient is agreeable to this plan and will call if any problems develop in the interim.   SignedTruitt Merle, NP  02/21/2018 10:51 AM  [facility] [address] [place], [place]  [address] Phone: [phone] Fax: [phone]

## 2018-02-21 NOTE — Patient Instructions (Addendum)
We will be checking the following labs today - BMET, CBC and BNP  Please go to [facility] to [place] on the first floor for a chest Xray - you may walk in.     Medication Instructions:    Continue with your current medicines for now.   Stop the Vesicare  Try to start using less Tramadol   Tylenol is fine.   If your labs are good - we may try to increase the Losartan   If you need a refill on your cardiac medications before your next appointment, please call your pharmacy.     Testing/Procedures To Be Arranged:  N/A  Follow-Up:   See [place] back in about 2 to 3 weeks.     At [facility], you and your health needs are our priority.  As part of our continuing mission to provide you with exceptional heart care, we have created designated Provider Care Teams.  These Care Teams include your primary Cardiologist (physician) and Advanced Practice Providers (APPs -  Physician Assistants and Nurse Practitioners) who all work together to provide you with the care you need, when you need it.  Special Instructions:  . Ok to not see Dr. Dennard Schaumann tomorrow and reschedule. . Monitor BP for [place] . Keep up with daily weights and salt restriction   Call the [place] office at [phone] if you have any questions, problems or concerns.

## 2018-02-22 ENCOUNTER — Ambulatory Visit: Payer: Medicare Other | Admitting: Family Medicine

## 2018-02-22 ENCOUNTER — Telehealth: Payer: Self-pay

## 2018-02-22 DIAGNOSIS — R600 Localized edema: Secondary | ICD-10-CM

## 2018-02-22 LAB — BASIC METABOLIC PANEL
BUN/Creatinine Ratio: 15 (ref 10–24)
BUN: 18 mg/dL (ref 8–27)
CO2: 22 mmol/L (ref 20–29)
Calcium: 8.7 mg/dL (ref 8.6–10.2)
Chloride: 97 mmol/L (ref 96–106)
Creatinine, Ser: 1.23 mg/dL (ref 0.76–1.27)
GFR calc Af Amer: 65 mL/min/{1.73_m2} (ref >59)
GFR calc non Af Amer: 56 mL/min/{1.73_m2} — ABNORMAL LOW (ref >59)
Glucose: 107 mg/dL — ABNORMAL HIGH (ref 65–99)
Potassium: 5.3 mmol/L — ABNORMAL HIGH (ref 3.5–5.2)
Sodium: 133 mmol/L — ABNORMAL LOW (ref 134–144)

## 2018-02-22 LAB — CBC
Hematocrit: 28.8 % — ABNORMAL LOW (ref 37.5–51.0)
Hemoglobin: 9.6 g/dL — ABNORMAL LOW (ref 13.0–17.7)
MCH: 31.7 pg (ref 26.6–33.0)
MCHC: 33.3 g/dL (ref 31.5–35.7)
MCV: 95 fL (ref 79–97)
Platelets: 640 10*3/uL — ABNORMAL HIGH (ref 150–450)
RBC: 3.03 x10E6/uL — ABNORMAL LOW (ref 4.14–5.80)
RDW: 14.2 % (ref 11.6–15.4)
WBC: 11.5 10*3/uL — ABNORMAL HIGH (ref 3.4–10.8)

## 2018-02-22 LAB — PRO B NATRIURETIC PEPTIDE: NT-Pro BNP: 2426 pg/mL — ABNORMAL HIGH (ref 0–486)

## 2018-02-22 MED ORDER — FUROSEMIDE 40 MG PO TABS: 60.0000 mg | ORAL_TABLET | Freq: Every day | ORAL | 11 refills | Status: DC

## 2018-02-22 NOTE — Telephone Encounter (Signed)
-----   Message from Burtis Junes, NP sent at 02/22/2018  7:07 AM EST ----- Please report.  Fluid level remains elevated.  Needs to increase Lasix to 60 mg a day.  Stay on current dose of Potassium.  Slight increase in white count - monitor for fever, make sure his incisions keep looking ok, etc.  Needs BMET and CBC in one week.  Stay on current dose of Losartan for now.

## 2018-02-22 NOTE — Telephone Encounter (Signed)
Patient aware of lab results. Patient will increase lasix 60 mg daily, and will come in for lab work on 03/01/18. Patient will continue to take potassium and losartan at same dose. Patient aware to monitor for fever and keep an eye on his incision.

## 2018-02-27 ENCOUNTER — Telehealth: Payer: Self-pay | Admitting: Nurse Practitioner

## 2018-02-27 NOTE — Telephone Encounter (Signed)
Will have Dr Marlou Porch review - pt saw Truitt Merle, NP 02/21/2018 and increased Furosemide from 40 mg QD to 60 mg daily on 2/6.  His wt then was 177 lbs.  Today he is reporting wt down to 155 lbs.  He is scheduled for lab work 2/13.  He is wanting to know if he should continue on this dosage?

## 2018-02-27 NOTE — Telephone Encounter (Signed)
New Message:     Pt says he have been on Lasix and now his weight is down to 155. His question is, should he continue to take his Lasix.

## 2018-02-27 NOTE — Telephone Encounter (Signed)
Reviewed with Dr Marlou Porch who gives orders for pt to decrease Furosemide to 40 mg daily to maintain recent wt.  If he should increase wt 3 lbs overnight he is to take the extra 20 mg.  Pt to keep lab appt as scheduled.   Attempted to contact patient by phone and lm to cb to review the information above.

## 2018-02-28 NOTE — Telephone Encounter (Signed)
Reviewed information with patient who states understanding.  He will have lab work drawn tomorrow as ordered.  All questions r/t medications answered.  He will continue to wt daily.

## 2018-03-01 ENCOUNTER — Other Ambulatory Visit: Payer: Medicare Other | Admitting: *Deleted

## 2018-03-01 DIAGNOSIS — R600 Localized edema: Secondary | ICD-10-CM

## 2018-03-01 LAB — CBC WITH DIFFERENTIAL/PLATELET
Basophils Absolute: 0.2 10*3/uL (ref 0.0–0.2)
Basos: 2 %
EOS (ABSOLUTE): 0.6 10*3/uL — ABNORMAL HIGH (ref 0.0–0.4)
Eos: 9 %
Hematocrit: 36 % — ABNORMAL LOW (ref 37.5–51.0)
Hemoglobin: 12 g/dL — ABNORMAL LOW (ref 13.0–17.7)
Immature Grans (Abs): 0 10*3/uL (ref 0.0–0.1)
Immature Granulocytes: 0 %
Lymphocytes Absolute: 1.1 10*3/uL (ref 0.7–3.1)
Lymphs: 17 %
MCH: 30.2 pg (ref 26.6–33.0)
MCHC: 33.3 g/dL (ref 31.5–35.7)
MCV: 91 fL (ref 79–97)
Monocytes Absolute: 0.7 10*3/uL (ref 0.1–0.9)
Monocytes: 10 %
Neutrophils Absolute: 4.1 10*3/uL (ref 1.4–7.0)
Neutrophils: 62 %
Platelets: 418 10*3/uL (ref 150–450)
RBC: 3.98 x10E6/uL — ABNORMAL LOW (ref 4.14–5.80)
RDW: 14.2 % (ref 11.6–15.4)
WBC: 6.7 10*3/uL (ref 3.4–10.8)

## 2018-03-01 LAB — BASIC METABOLIC PANEL
BUN/Creatinine Ratio: 19 (ref 10–24)
BUN: 28 mg/dL — ABNORMAL HIGH (ref 8–27)
CO2: 23 mmol/L (ref 20–29)
Calcium: 8.9 mg/dL (ref 8.6–10.2)
Chloride: 100 mmol/L (ref 96–106)
Creatinine, Ser: 1.46 mg/dL — ABNORMAL HIGH (ref 0.76–1.27)
GFR calc Af Amer: 53 mL/min/{1.73_m2} — ABNORMAL LOW (ref >59)
GFR calc non Af Amer: 46 mL/min/{1.73_m2} — ABNORMAL LOW (ref >59)
Glucose: 106 mg/dL — ABNORMAL HIGH (ref 65–99)
Potassium: 5.1 mmol/L (ref 3.5–5.2)
Sodium: 138 mmol/L (ref 134–144)

## 2018-03-02 ENCOUNTER — Telehealth: Payer: Self-pay | Admitting: Nurse Practitioner

## 2018-03-02 ENCOUNTER — Other Ambulatory Visit: Payer: Self-pay | Admitting: Surgery

## 2018-03-02 DIAGNOSIS — Z951 Presence of aortocoronary bypass graft: Secondary | ICD-10-CM

## 2018-03-02 NOTE — Telephone Encounter (Signed)
New Message           Patient returning call for lab results, pls return call.

## 2018-03-02 NOTE — Telephone Encounter (Signed)
Rainey Pines CMA s/w pt.

## 2018-03-04 ENCOUNTER — Other Ambulatory Visit (HOSPITAL_COMMUNITY): Payer: Self-pay | Admitting: Physician Assistant

## 2018-03-05 ENCOUNTER — Ambulatory Visit (INDEPENDENT_AMBULATORY_CARE_PROVIDER_SITE_OTHER): Payer: Self-pay | Admitting: Physician Assistant

## 2018-03-05 ENCOUNTER — Ambulatory Visit
Admission: RE | Admit: 2018-03-05 | Discharge: 2018-03-05 | Disposition: A | Discharge status: Home / Self Care | Payer: Medicare Other | Source: Ambulatory Visit | Attending: Surgery | Admitting: Surgery

## 2018-03-05 VITALS — BP 120/70 | HR 62 | Resp 20 | Ht 67.0 in | Wt 157.0 lb

## 2018-03-05 DIAGNOSIS — I2510 Atherosclerotic heart disease of native coronary artery without angina pectoris: Secondary | ICD-10-CM

## 2018-03-05 DIAGNOSIS — Z951 Presence of aortocoronary bypass graft: Secondary | ICD-10-CM

## 2018-03-05 NOTE — Progress Notes (Signed)
HPI: Patient returns for routine postoperative follow-up having undergone CABG x4 by Dr. Mohammed Kindle about 4 weeks ago. The patient's early postoperative recovery while in the hospital was uncomplicated except for a brief episode of atrial fibrillation that was treated with amiodarone and promptly converted to sinus rhythm.. Since hospital discharge the patient phoned our practice with multiple concerns related to side effects of the amiodarone.  His amiodarone was discontinued. Overall he has been making steady and satisfactory progress since his discharge home.  He has been treated for volume overload by the cardiology team and has lost approximately 20 pounds since his discharge home.  He denies any further sensation of tachycardia or palpitations since the amiodarone was discontinued.  He denies any chest pain or shortness of breath.  He is making excellent progress with his endurance and is asking about starting a cardiac rehab program.  Current Outpatient Medications  Medication Sig Dispense Refill  . acetaminophen (TYLENOL) 325 MG tablet Take 2 tablets (650 mg total) by mouth every 6 (six) hours as needed for mild pain.    Marland Kitchen aspirin EC 81 MG tablet Take 81 mg by mouth daily.    Marland Kitchen atorvastatin (LIPITOR) 80 MG tablet Take 1 tablet (80 mg total) by mouth daily at 6 PM. 30 tablet 3  . clopidogrel (PLAVIX) 75 MG tablet Take 1 tablet (75 mg total) by mouth daily. 30 tablet 3  . Cyanocobalamin (VITAMIN B-12 PO) Take 1 tablet by mouth daily.    . furosemide (LASIX) 40 MG tablet Take 1.5 tablets (60 mg total) by mouth daily. 45 tablet 11  . latanoprost (XALATAN) 0.005 % ophthalmic solution Place 1 drop into both eyes at bedtime.   0  . losartan (COZAAR) 25 MG tablet Take 1 tablet (25 mg total) by mouth daily. 30 tablet 3  . metoprolol tartrate (LOPRESSOR) 25 MG tablet Take 1 tablet (25 mg total) by mouth 2 (two) times daily. 60 tablet 3  . Multiple Vitamin (MULTIVITAMIN) tablet Take 1 tablet by mouth  daily.    Marland Kitchen NEXIUM 40 MG capsule TAKE 1 CAPSULE (40 MG TOTAL) BY MOUTH DAILY. (Patient taking differently: Take 40 mg by mouth daily. ) 90 capsule 3  . potassium chloride (K-DUR) 10 MEQ tablet Take 2 tablets (20 mEq total) by mouth 2 (two) times daily. 28 tablet 2  . timolol (TIMOPTIC) 0.5 % ophthalmic solution Place 1 drop into both eyes daily.    . traMADol (ULTRAM) 50 MG tablet Take 1 tablet (50 mg total) by mouth every 6 (six) hours as needed for moderate pain. 30 tablet 0  . triamcinolone cream (KENALOG) 0.1 % Apply 1 application topically 2 (two) times daily as needed. 30 g 0  . valACYclovir (VALTREX) 1000 MG tablet Take 2 tablets (2,000 mg total) by mouth 2 (two) times daily. X 1 day 8 tablet 3   No current facility-administered medications for this visit.     Physical Exam: Vital signs: BP 120/70   Pulse 62   Respirations 20   Oxygen saturation 96%  Heart: Regular rate and rhythm  Chest: Breath sounds are clear to auscultation.  Sternotomy incision is healing with no sign of complication.  Sternum is stable.  The chest tube insertion sites are healing satisfactorily.  Extremities: Bilateral EVH harvest incisions are well approximated and dry.  He has no peripheral edema  Diagnostic Tests:  EXAM: CHEST - 2 VIEW  COMPARISON:  February 21, 2018  FINDINGS: Blunting of the costophrenic angles, right greater than  left, consistent with small effusions versus pleural thickening. The heart, hila, mediastinum, lungs, and pleura are otherwise normal.  IMPRESSION: Blunting of the costophrenic angles consistent with small effusions versus pleural thickening. No other abnormalities.   Electronically Signed   By: Dorise Bullion III M.D   On: 03/05/2018 14:00   Impression/Plan: Mr. Tober is 4 weeks status post CABG x4 with LIMA to diagonal, free RIMA to the OM, and sequential saphenous vein graft to the PDA PL for multivessel coronary artery disease after admission with  non-ST elevation myocardial infarction.  He had a brief episode of atrial fibrillation postoperatively.  He has not been on amiodarone since the first week following his discharge and has had no further palpitations.  He has had significant weight loss while being diuresed over the past several days.  This is being managed by the cardiology team.  He has a follow-up appointment with Ms. Servando Snare tomorrow.  We will go ahead and referral to cardiac rehab.  He is instructed he may resume driving.  Sternal precautions were given reinforced.  Medications are reviewed.  He understands we will would like him to continue with the aspirin, Plavix, losartan, and metoprolol.  He could discontinue the Lasix from our standpoint but I will defer that decision to cardiology since she has a follow-up visit tomorrow.  No further follow-up is scheduled with our practice I asked him to contact us if we could be of any further assistance with this care.       Antony Odea, PA-C [facility] [phone]

## 2018-03-05 NOTE — Patient Instructions (Signed)
May resume driving. Continue to observe sternal precautions for another 4 weeks May proceed with cardiac rehab. Follow-up as needed.

## 2018-03-06 ENCOUNTER — Ambulatory Visit: Payer: Medicare Other | Admitting: Cardiology

## 2018-03-06 ENCOUNTER — Encounter: Payer: Self-pay | Admitting: Cardiology

## 2018-03-06 VITALS — BP 130/70 | HR 60 | Ht 67.0 in | Wt 158.0 lb

## 2018-03-06 DIAGNOSIS — I10 Essential (primary) hypertension: Secondary | ICD-10-CM

## 2018-03-06 DIAGNOSIS — I2510 Atherosclerotic heart disease of native coronary artery without angina pectoris: Secondary | ICD-10-CM

## 2018-03-06 DIAGNOSIS — I480 Paroxysmal atrial fibrillation: Secondary | ICD-10-CM

## 2018-03-06 DIAGNOSIS — Z951 Presence of aortocoronary bypass graft: Secondary | ICD-10-CM

## 2018-03-06 DIAGNOSIS — R600 Localized edema: Secondary | ICD-10-CM | POA: Diagnosis not present

## 2018-03-06 DIAGNOSIS — I5022 Chronic systolic (congestive) heart failure: Secondary | ICD-10-CM

## 2018-03-06 DIAGNOSIS — I214 Non-ST elevation (NSTEMI) myocardial infarction: Secondary | ICD-10-CM

## 2018-03-06 MED ORDER — ATORVASTATIN CALCIUM 80 MG PO TABS: 80.0000 mg | ORAL_TABLET | Freq: Every day | ORAL | 3 refills | Status: DC

## 2018-03-06 MED ORDER — METOPROLOL SUCCINATE ER 50 MG PO TB24: 50.0000 mg | ORAL_TABLET | Freq: Every day | ORAL | 3 refills | Status: DC

## 2018-03-06 MED ORDER — LOSARTAN POTASSIUM 50 MG PO TABS: 50.0000 mg | ORAL_TABLET | Freq: Every day | ORAL | 3 refills | Status: DC

## 2018-03-06 MED ORDER — CLOPIDOGREL BISULFATE 75 MG PO TABS: 75.0000 mg | ORAL_TABLET | Freq: Every day | ORAL | 3 refills | Status: DC

## 2018-03-06 NOTE — Patient Instructions (Signed)
Medication Instructions:  You may discontinue your Furosemide daily and only take it if your weight goes up 3 lbs overnight. Please discontinue your Metoprolol tartrate and start Metoprolol Succinate 50 mg daily.   Increase Cozaar to 50 mg a day. Continue all other medications as listed.  If you need a refill on your cardiac medications before your next appointment, please call your pharmacy.   Testing/Procedures: Your physician has requested that you have an echocardiogram at the end of April. Echocardiography is a painless test that uses sound waves to create images of your heart. It provides your doctor with information about the size and shape of your heart and how well your heart's chambers and valves are working. This procedure takes approximately one hour. There are no restrictions for this procedure.  Follow-Up: At [facility], you and your health needs are our priority.  As part of our continuing mission to provide you with exceptional heart care, we have created designated Provider Care Teams.  These Care Teams include your primary Cardiologist (physician) and Advanced Practice Providers (APPs -  Physician Assistants and Nurse Practitioners) who all work together to provide you with the care you need, when you need it. You will need a follow up appointment in 3 months with Truitt Merle, NP and 6 months with Dr Marlou Porch.  Please call our office 2 months in advance to schedule this appointment.  You may see Candee Furbish, MD or one of the following Advanced Practice Providers on your designated Care Team:   Truitt Merle, NP Cecilie Kicks, NP . Kathyrn Drown, NP  Thank you for choosing [facility]!!

## 2018-03-06 NOTE — Progress Notes (Signed)
Cardiology Office Note:    Date:  03/06/2018   ID:  Alex Martin, DOB [birth year], MRN [id number]  PCP:  Susy Frizzle, MD  Cardiologist:  Candee Furbish, MD  Electrophysiologist:  None   Referring MD: Susy Frizzle, MD     History of Present Illness:    Alex Martin is a 78 y.o. male here for the follow-up of coronary artery disease status post CABG 02/05/2018.  I admitted him to the hospital with chest discomfort and elevated troponin consistent with non-ST elevation myocardial infarction.  Originally woke up from sleep at 230 with substernal chest discomfort radiating to his back and bilateral arms it was very intense.  ST depressions were noted in V5 and V6.  Original troponin was elevated at 2.28.  He then went forward with cardiac catheterization which revealed triple-vessel disease.  Dr. Dema Severin was consulted and performed CABG on 02/05/2018 x4 LIMA to LAD free RIMA to OM, sequential SVG to PDA and PL.  He was discharged on 02/10/2018.  He did develop some A. fib with RVR that was treated with IV amiodarone and successfully converted to normal sinus rhythm.  He was started on Plavix for ACS.  P.o. amiodarone was discontinued after having side effects.  There was some suspicion that some of the side effects could have been alcohol withdrawal.  No further palpitations.  No fevers chills nausea vomiting syncope bleeding.  There was comfort and potentially discontinuing his Lasix during yesterday's visit with cardiothoracic surgery PA.  He did see Truitt Merle who increased furosemide from 40-60 back on 02/21/2018.  Appears to be euvolemic currently.  EF was reduced 30 to 35% with apical lateral akinesis.  This was pre-surgery.  Past Medical History:  Diagnosis Date  . Abdominal distension    UMBILICAL HERNIA-NO PAIN  . Bleeding from the nose    SEVERAL EPISODES THAT REQUIRED TX IN ER--LAST EPISODE WAS APPROX 1 YR AGO  . Blood in urine    MICROSCOPIC BLOODEVALUATED BY  DR. NESI FRIDAY 02/11/11--URINE SAMPLE AND EXAM-- AND BLOOD SAMPLE AND TO BE SEEN AGAIN IN 6 MONTHS AND GIVEN VESICARE  FOR FREQUENCY AND URGENCY  . GERD (gastroesophageal reflux disease)   . Hypertension     Past Surgical History:  Procedure Laterality Date  . CORONARY ARTERY BYPASS GRAFT N/A 02/05/2018   Procedure: CORONARY ARTERY BYPASS GRAFTING (CABG) x4 USING BILATERAL IMA'S WITH LEFT GREATER SAPHENOUS VEIN HARVEST AND EXPLORATION OF RIGHT SAPHENOUS;  Surgeon: Gaye Pollack, MD;  Location: [facility];  Service: Open Heart Surgery;  Laterality: N/A;  . [place]  . LEFT HEART CATH AND CORONARY ANGIOGRAPHY N/A 02/02/2018   Procedure: LEFT HEART CATH AND CORONARY ANGIOGRAPHY;  Surgeon: Martinique, Peter M, MD;  Location: [place] [facility];  Service: Cardiovascular;  Laterality: N/A;  . TEE WITHOUT CARDIOVERSION N/A 02/05/2018   Procedure: TRANSESOPHAGEAL ECHOCARDIOGRAM (TEE);  Surgeon: Gaye Pollack, MD;  Location: [place];  Service: Open Heart Surgery;  Laterality: N/A;  . UMBILICAL HERNIA REPAIR  02/15/2011   Procedure: HERNIA REPAIR UMBILICAL ADULT;  Surgeon: Odis Hollingshead, MD;  Location: [facility];  Service: General;  Laterality: N/A;  umbilical hernia repair with mesh  . VASECTOMY  1976    Current Medications: Current Meds  Medication Sig  . acetaminophen (TYLENOL) 325 MG tablet Take 2 tablets (650 mg total) by mouth every 6 (six) hours as needed for mild pain.  Marland Kitchen aspirin EC 81 MG tablet  Take 81 mg by mouth daily.  Marland Kitchen atorvastatin (LIPITOR) 80 MG tablet Take 1 tablet (80 mg total) by mouth daily at 6 PM.  . clopidogrel (PLAVIX) 75 MG tablet Take 1 tablet (75 mg total) by mouth daily.  . Cyanocobalamin (VITAMIN B-12 PO) Take 1 tablet by mouth daily.  . furosemide (LASIX) 40 MG tablet Take 1 tablet (40 mg total) by mouth daily as needed.  . latanoprost (XALATAN) 0.005 % ophthalmic solution Place 1 drop into both eyes at bedtime.   Marland Kitchen losartan (COZAAR) 50 MG tablet Take 1 tablet  (50 mg total) by mouth daily.  . Multiple Vitamin (MULTIVITAMIN) tablet Take 1 tablet by mouth daily.  Marland Kitchen NEXIUM 40 MG capsule TAKE 1 CAPSULE (40 MG TOTAL) BY MOUTH DAILY.  Marland Kitchen potassium chloride (K-DUR) 10 MEQ tablet Take 2 tablets (20 mEq total) by mouth 2 (two) times daily.  . timolol (TIMOPTIC) 0.5 % ophthalmic solution Place 1 drop into both eyes daily.  . traMADol (ULTRAM) 50 MG tablet Take 1 tablet (50 mg total) by mouth every 6 (six) hours as needed for moderate pain.  Marland Kitchen triamcinolone cream (KENALOG) 0.1 % Apply 1 application topically 2 (two) times daily as needed.  . valACYclovir (VALTREX) 1000 MG tablet Take 2 tablets (2,000 mg total) by mouth 2 (two) times daily. X 1 day (Patient taking differently: Take 2,000 mg by mouth as needed. )  . [DISCONTINUED] atorvastatin (LIPITOR) 80 MG tablet Take 1 tablet (80 mg total) by mouth daily at 6 PM.  . [DISCONTINUED] atorvastatin (LIPITOR) 80 MG tablet Take 1 tablet (80 mg total) by mouth daily at 6 PM.  . [DISCONTINUED] clopidogrel (PLAVIX) 75 MG tablet Take 1 tablet (75 mg total) by mouth daily.  . [DISCONTINUED] furosemide (LASIX) 40 MG tablet Take 1.5 tablets (60 mg total) by mouth daily.  . [DISCONTINUED] losartan (COZAAR) 25 MG tablet Take 1 tablet (25 mg total) by mouth daily.  . [DISCONTINUED] metoprolol tartrate (LOPRESSOR) 25 MG tablet Take 1 tablet (25 mg total) by mouth 2 (two) times daily.     Allergies:   Amlodipine and Shellfish allergy   Social History   Socioeconomic History  . Marital status: Single    Spouse name: Not on file  . Number of children: Not on file  . Years of education: Not on file  . Highest education level: Not on file  Occupational History  . Not on file  Social Needs  . Financial resource strain: Not on file  . Food insecurity:    Worry: Not on file    Inability: Not on file  . Transportation needs:    Medical: Not on file    Non-medical: Not on file  Tobacco Use  . Smoking status: Former Smoker      Years: 10.00    Last attempt to quit: 02/22/1992    Years since quitting: 26.0  . Smokeless tobacco: Never Used  . Tobacco comment: QUIT SMOKING ABOUT 1994 - 1 PP WEEK  Substance and Sexual Activity  . Alcohol use: Yes    Comment: 2 GLASSES WINE DAILY  . Drug use: No  . Sexual activity: Not on file  Lifestyle  . Physical activity:    Days per week: Not on file    Minutes per session: Not on file  . Stress: Not on file  Relationships  . Social connections:    Talks on phone: Not on file    Gets together: Not on file    Attends  religious service: Not on file    Active member of club or organization: Not on file    Attends meetings of clubs or organizations: Not on file    Relationship status: Not on file  Other Topics Concern  . Not on file  Social History Narrative  . Not on file     Family History: The patient's family history includes Cancer in his sister; Heart disease in his father and mother.  ROS:   Please see the history of present illness.    Denies any fevers chills nausea vomiting syncope bleeding all other systems reviewed and are negative.  EKGs/Labs/Other Studies Reviewed:    The following studies were reviewed today: Prior hospital records cardiac catheterization echocardiogram lab work  EKG:  EKG is not ordered today.    Recent Labs: 11/07/2017: ALT 15 02/06/2018: Magnesium 2.5 02/21/2018: NT-Pro BNP 2,426 03/01/2018: BUN 28; Creatinine, Ser 1.46; Hemoglobin 12.0; Platelets 418; Potassium 5.1; Sodium 138  Recent Lipid Panel    Component Value Date/Time   CHOL 194 02/03/2018 0500   TRIG 127 02/03/2018 0500   HDL 59 02/03/2018 0500   CHOLHDL 3.3 02/03/2018 0500   VLDL 25 02/03/2018 0500   LDLCALC 110 (H) 02/03/2018 0500   LDLCALC 97 11/07/2017 1017    Physical Exam:    VS:  BP 130/70   Pulse 60   Ht 5\' 7"  (1.702 m)   Wt 158 lb (71.7 kg)   SpO2 98%   BMI 24.75 kg/m     Wt Readings from Last 3 Encounters:  03/06/18 158 lb (71.7 kg)   03/05/18 157 lb (71.2 kg)  02/21/18 177 lb 1.9 oz (80.3 kg)     GEN:  Well nourished, well developed in no acute distress HEENT: Normal NECK: No JVD; No carotid bruits LYMPHATICS: No lymphadenopathy CARDIAC: RRR, no murmurs, rubs, gallops, CABG scar well-healing RESPIRATORY:  Clear to auscultation without rales, wheezing or rhonchi  ABDOMEN: Soft, non-tender, non-distended MUSCULOSKELETAL:  No edema; No deformity  SKIN: Warm and dry NEUROLOGIC:  Alert and oriented x 3 PSYCHIATRIC:  Normal affect   ASSESSMENT:    1. Coronary artery disease involving native coronary artery of native heart without angina pectoris   2. Localized edema   3. Essential hypertension   4. S/P CABG (coronary artery bypass graft)   5. NSTEMI (non-ST elevated myocardial infarction) ([place])   6. PAF (paroxysmal atrial fibrillation) ([place])   7. Chronic systolic (congestive) heart failure (HCC)    PLAN:    In order of problems listed above:  Ischemic cardiomyopathy -EF 30 to 35% pre-bypass.  This was in January 2020.  We will revisit echocardiogram in 3 months. -Continue with beta-blocker, angiotensin receptor blocker-consolidating beta-blocker and increasing ARB as below.  Coronary artery disease status post CABG -January 2020.  In the setting of non-ST elevation myocardial infarction.  EF reduced.  Starting cardiac rehab soon.  Understands lifting restrictions.  Non-ST elevation myocardial infarction -Continue with aspirin and Plavix.  Paroxysmal atrial fibrillation - Brief postop, off of amiodarone now.  Stopped amiodarone because of potential side effects but this was thought to be potentially alcohol withdrawal.  Chronic systolic heart failure -Appears euvolemic.  Blood pressure under good control.  Repeat echocardiogram in late April. -Consolidate metoprolol tartrate to metoprolol succinate 50 mg once a day. - Increase losartan from 25 to 50 mg a day.  Blood pressure 130/70.  Hyperlipidemia -On  high intensity statin therapy.  LDL goal less than 70.  Repeat lipid panel  at next visit.  Alcohol use - Continue to promote cessation.  Medication Adjustments/Labs and Tests Ordered: Current medicines are reviewed at length with the patient today.  Concerns regarding medicines are outlined above.  Orders Placed This Encounter  Procedures  . ECHOCARDIOGRAM COMPLETE   Meds ordered this encounter  Medications  . losartan (COZAAR) 50 MG tablet    Sig: Take 1 tablet (50 mg total) by mouth daily.    Dispense:  90 tablet    Refill:  3  . metoprolol succinate (TOPROL-XL) 50 MG 24 hr tablet    Sig: Take 1 tablet (50 mg total) by mouth daily. Take with or immediately following a meal.    Dispense:  90 tablet    Refill:  3  . clopidogrel (PLAVIX) 75 MG tablet    Sig: Take 1 tablet (75 mg total) by mouth daily.    Dispense:  90 tablet    Refill:  3  . DISCONTD: atorvastatin (LIPITOR) 80 MG tablet    Sig: Take 1 tablet (80 mg total) by mouth daily at 6 PM.    Dispense:  90 tablet    Refill:  3  . atorvastatin (LIPITOR) 80 MG tablet    Sig: Take 1 tablet (80 mg total) by mouth daily at 6 PM.    Dispense:  90 tablet    Refill:  3    Patient Instructions  Medication Instructions:  You may discontinue your Furosemide daily and only take it if your weight goes up 3 lbs overnight. Please discontinue your Metoprolol tartrate and start Metoprolol Succinate 50 mg daily.   Increase Cozaar to 50 mg a day. Continue all other medications as listed.  If you need a refill on your cardiac medications before your next appointment, please call your pharmacy.   Testing/Procedures: Your physician has requested that you have an echocardiogram at the end of April. Echocardiography is a painless test that uses sound waves to create images of your heart. It provides your doctor with information about the size and shape of your heart and how well your heart's chambers and valves are working. This procedure  takes approximately one hour. There are no restrictions for this procedure.  Follow-Up: At [facility], you and your health needs are our priority.  As part of our continuing mission to provide you with exceptional heart care, we have created designated Provider Care Teams.  These Care Teams include your primary Cardiologist (physician) and Advanced Practice Providers (APPs -  Physician Assistants and Nurse Practitioners) who all work together to provide you with the care you need, when you need it. You will need a follow up appointment in 3 months with Truitt Merle, NP and 6 months with Dr Marlou Porch.  Please call our office 2 months in advance to schedule this appointment.  You may see Candee Furbish, MD or one of the following Advanced Practice Providers on your designated Care Team:   Truitt Merle, NP Cecilie Kicks, NP . Kathyrn Drown, NP  Thank you for choosing [facility]!!          Signed, Candee Furbish, MD  03/06/2018 3:00 PM    [place]

## 2018-03-07 NOTE — Telephone Encounter (Signed)
Pt called wanting to schedule for Cardiac Rehab per notes he wasn't ready until he completed his follow up on 2/17 and 2/18 and waiting on nursing to review his chart to see if pt is ready to be scheduled. Pt understood. Tedra Sen[place]. Support Rep II

## 2018-03-09 NOTE — Telephone Encounter (Signed)
Pt called and schedule CR, patient will come in for orientation on 03/29/2018 @ 815AM and will attend the 115PM exercise class.  Mailed homework package.

## 2018-03-16 ENCOUNTER — Telehealth (HOSPITAL_COMMUNITY): Payer: Self-pay

## 2018-03-26 ENCOUNTER — Telehealth (HOSPITAL_COMMUNITY): Payer: Self-pay | Admitting: Pharmacist

## 2018-03-26 NOTE — Telephone Encounter (Signed)
Cardiac Rehab Medication Review by a Pharmacist  Does the patient  feel that his/her medications are working for him/her?  yes  Has the patient been experiencing any side effects to the medications prescribed?  no  Does the patient measure his/her own blood pressure or blood glucose at home?  No  Does the patient have any problems obtaining medications due to transportation or finances?   no  Understanding of regimen: fair Understanding of indications: fair Potential of compliance: good    Pharmacist comments: Patient retrieved his medication bottles and went over all medications with me. No questions for me at this time.  Janae Bridgeman, PharmD PGY1 Pharmacy Resident Phone: [phone] 03/26/2018 3:45 PM

## 2018-03-28 NOTE — Progress Notes (Signed)
RENLEY BANWART 78 y.o. male DOB 1940/05/27 MRN [id number]       Nutrition Screen Note  No diagnosis found. Past Medical History:  Diagnosis Date  . Abdominal distension    UMBILICAL HERNIA-NO PAIN  . Bleeding from the nose    SEVERAL EPISODES THAT REQUIRED TX IN ER--LAST EPISODE WAS APPROX 1 YR AGO  . Blood in urine    MICROSCOPIC BLOODEVALUATED BY DR. NESI FRIDAY 02/11/11--URINE SAMPLE AND EXAM-- AND BLOOD SAMPLE AND TO BE SEEN AGAIN IN 6 MONTHS AND GIVEN VESICARE  FOR FREQUENCY AND URGENCY  . GERD (gastroesophageal reflux disease)   . Hypertension    Meds reviewed.     Current Outpatient Medications (Cardiovascular):  .  atorvastatin (LIPITOR) 80 MG tablet, Take 1 tablet (80 mg total) by mouth daily at 6 PM. .  furosemide (LASIX) 40 MG tablet, Take 1 tablet (40 mg total) by mouth daily as needed. Marland Kitchen  losartan (COZAAR) 50 MG tablet, Take 1 tablet (50 mg total) by mouth daily. .  metoprolol succinate (TOPROL-XL) 50 MG 24 hr tablet, Take 1 tablet (50 mg total) by mouth daily. Take with or immediately following a meal.   Current Outpatient Medications (Analgesics):  .  acetaminophen (TYLENOL) 325 MG tablet, Take 2 tablets (650 mg total) by mouth every 6 (six) hours as needed for mild pain. Marland Kitchen  aspirin EC 81 MG tablet, Take 81 mg by mouth daily. .  traMADol (ULTRAM) 50 MG tablet, Take 1 tablet (50 mg total) by mouth every 6 (six) hours as needed for moderate pain. (Patient not taking: Reported on 03/26/2018)  Current Outpatient Medications (Hematological):  .  clopidogrel (PLAVIX) 75 MG tablet, Take 1 tablet (75 mg total) by mouth daily. .  Cyanocobalamin (VITAMIN B-12 PO), Take 1 tablet by mouth daily.  Current Outpatient Medications (Other):  .  latanoprost (XALATAN) 0.005 % ophthalmic solution, Place 1 drop into both eyes at bedtime.  .  Multiple Vitamin (MULTIVITAMIN) tablet, Take 1 tablet by mouth daily. Marland Kitchen  NEXIUM 40 MG capsule, TAKE 1 CAPSULE (40 MG TOTAL) BY MOUTH DAILY. Marland Kitchen   potassium chloride (K-DUR) 10 MEQ tablet, Take 2 tablets (20 mEq total) by mouth 2 (two) times daily. (Patient not taking: Reported on 03/26/2018) .  solifenacin (VESICARE) 5 MG tablet, Take 5 mg by mouth daily. .  timolol (TIMOPTIC) 0.5 % ophthalmic solution, Place 1 drop into both eyes daily. Marland Kitchen  triamcinolone cream (KENALOG) 0.1 %, Apply 1 application topically 2 (two) times daily as needed. (Patient not taking: Reported on 03/26/2018) .  valACYclovir (VALTREX) 1000 MG tablet, Take 2 tablets (2,000 mg total) by mouth 2 (two) times daily. X 1 day (Patient not taking: Reported on 03/26/2018)   HT: Ht Readings from Last 1 Encounters:  03/06/18 5\' 7"  (1.702 m)    WT: Wt Readings from Last 5 Encounters:  03/06/18 158 lb (71.7 kg)  03/05/18 157 lb (71.2 kg)  02/21/18 177 lb 1.9 oz (80.3 kg)  02/10/18 179 lb 4.8 oz (81.3 kg)  11/14/17 175 lb (79.4 kg)     BMI = 24.75   Current tobacco use? No       Labs:  Lipid Panel     Component Value Date/Time   CHOL 194 02/03/2018 0500   TRIG 127 02/03/2018 0500   HDL 59 02/03/2018 0500   CHOLHDL 3.3 02/03/2018 0500   VLDL 25 02/03/2018 0500   LDLCALC 110 (H) 02/03/2018 0500   LDLCALC 97 11/07/2017 1017  Lab Results  Component Value Date   HGBA1C 5.5 02/02/2018   CBG (last 3)  No results for input(s): GLUCAP in the last 72 hours.  Nutrition Diagnosis ? Food-and nutrition-related knowledge deficit related to lack of exposure to information as related to diagnosis of: ? CVD  Nutrition Goal(s):  ? To be determined  Plan:  Pt to attend nutrition classes ? Nutrition I ? Nutrition II ? Portion Distortion  ? Diabetes Blitz ? Diabetes Q & A Will provide client-centered nutrition education as part of interdisciplinary care.   Monitor and evaluate progress toward nutrition goal with team.  Laurina Bustle, MS, RD, LDN 03/28/2018 11:01 AM

## 2018-03-29 ENCOUNTER — Other Ambulatory Visit: Payer: Self-pay

## 2018-03-29 ENCOUNTER — Encounter (HOSPITAL_COMMUNITY): Payer: Self-pay

## 2018-03-29 ENCOUNTER — Encounter (HOSPITAL_COMMUNITY)
Admission: RE | Admit: 2018-03-29 | Discharge: 2018-03-29 | Disposition: A | Discharge status: Home / Self Care | Payer: Medicare Other | Source: Ambulatory Visit | Attending: Cardiology | Admitting: Cardiology

## 2018-03-29 VITALS — BP 110/62 | HR 64 | Ht 66.5 in | Wt 161.8 lb

## 2018-03-29 DIAGNOSIS — I214 Non-ST elevation (NSTEMI) myocardial infarction: Secondary | ICD-10-CM | POA: Insufficient documentation

## 2018-03-29 DIAGNOSIS — Z951 Presence of aortocoronary bypass graft: Secondary | ICD-10-CM

## 2018-03-29 HISTORY — DX: Hyperlipidemia, unspecified: E78.5

## 2018-03-29 HISTORY — DX: Atherosclerotic heart disease of native coronary artery without angina pectoris: I25.10

## 2018-03-29 NOTE — Progress Notes (Signed)
Cardiac Individual Treatment Plan  Patient Details  Name: Alex Martin MRN: [id number] Date of Birth: 16-Mar-1940 Referring Provider:     [place] from 03/29/2018 in [facility]  Referring Provider  Candee Furbish, MD       Initial Encounter Date:    CARDIAC REHAB PHASE II ORIENTATION from 03/29/2018 in [facility]  Date  03/29/18      Visit Diagnosis: NSTEMI (non-ST elevated myocardial infarction) ([place]) 02/02/18  S/P CABG x 4 02/05/18  Patient's Home Medications on Admission:  Current Outpatient Medications:  .  acetaminophen (TYLENOL) 325 MG tablet, Take 2 tablets (650 mg total) by mouth every 6 (six) hours as needed for mild pain., Disp: , Rfl:  .  aspirin EC 81 MG tablet, Take 81 mg by mouth daily., Disp: , Rfl:  .  atorvastatin (LIPITOR) 80 MG tablet, Take 1 tablet (80 mg total) by mouth daily at 6 PM., Disp: 90 tablet, Rfl: 3 .  clopidogrel (PLAVIX) 75 MG tablet, Take 1 tablet (75 mg total) by mouth daily., Disp: 90 tablet, Rfl: 3 .  Cyanocobalamin (VITAMIN B-12 PO), Take 1 tablet by mouth daily., Disp: , Rfl:  .  furosemide (LASIX) 40 MG tablet, Take 1 tablet (40 mg total) by mouth daily as needed., Disp: , Rfl:  .  latanoprost (XALATAN) 0.005 % ophthalmic solution, Place 1 drop into both eyes at bedtime. , Disp: , Rfl: 0 .  losartan (COZAAR) 50 MG tablet, Take 1 tablet (50 mg total) by mouth daily., Disp: 90 tablet, Rfl: 3 .  metoprolol succinate (TOPROL-XL) 50 MG 24 hr tablet, Take 1 tablet (50 mg total) by mouth daily. Take with or immediately following a meal., Disp: 90 tablet, Rfl: 3 .  Multiple Vitamin (MULTIVITAMIN) tablet, Take 1 tablet by mouth daily., Disp: , Rfl:  .  NEXIUM 40 MG capsule, TAKE 1 CAPSULE (40 MG TOTAL) BY MOUTH DAILY., Disp: 90 capsule, Rfl: 3 .  potassium chloride (K-DUR) 10 MEQ tablet, Take 2 tablets (20 mEq total) by mouth 2 (two) times daily. (Patient not  taking: Reported on 03/26/2018), Disp: 28 tablet, Rfl: 2 .  solifenacin (VESICARE) 5 MG tablet, Take 5 mg by mouth daily., Disp: , Rfl:  .  timolol (TIMOPTIC) 0.5 % ophthalmic solution, Place 1 drop into both eyes daily., Disp: , Rfl:  .  traMADol (ULTRAM) 50 MG tablet, Take 1 tablet (50 mg total) by mouth every 6 (six) hours as needed for moderate pain. (Patient not taking: Reported on 03/26/2018), Disp: 30 tablet, Rfl: 0 .  triamcinolone cream (KENALOG) 0.1 %, Apply 1 application topically 2 (two) times daily as needed. (Patient not taking: Reported on 03/26/2018), Disp: 30 g, Rfl: 0 .  valACYclovir (VALTREX) 1000 MG tablet, Take 2 tablets (2,000 mg total) by mouth 2 (two) times daily. X 1 day (Patient not taking: Reported on 03/26/2018), Disp: 8 tablet, Rfl: 3  Past Medical History: Past Medical History:  Diagnosis Date  . Abdominal distension    UMBILICAL HERNIA-NO PAIN  . Bleeding from the nose    SEVERAL EPISODES THAT REQUIRED TX IN ER--LAST EPISODE WAS APPROX 1 YR AGO  . Blood in urine    MICROSCOPIC BLOODEVALUATED BY DR. NESI FRIDAY 02/11/11--URINE SAMPLE AND EXAM-- AND BLOOD SAMPLE AND TO BE SEEN AGAIN IN 6 MONTHS AND GIVEN VESICARE  FOR FREQUENCY AND URGENCY  . Coronary artery disease   . GERD (gastroesophageal reflux disease)   .  Hyperlipidemia   . Hypertension     Tobacco Use: Social History   Tobacco Use  Smoking Status Former Smoker  . Years: 10.00  . Last attempt to quit: 02/22/1992  . Years since quitting: 26.1  Smokeless Tobacco Never Used  Tobacco Comment   QUIT SMOKING ABOUT 1994 - 1 PP WEEK    Labs: Recent Review Flowsheet Data    Labs for ITP Cardiac and Pulmonary Rehab Latest Ref Rng & Units 02/05/2018 02/05/2018 02/05/2018 02/05/2018 02/05/2018   Cholestrol 0 - 200 mg/dL - - - - -   LDLCALC 0 - 99 mg/dL - - - - -   HDL >40 mg/dL - - - - -   Trlycerides <150 mg/dL - - - - -   Hemoglobin A1c 4.8 - 5.6 % - - - - -   PHART 7.350 - 7.450 7.411 7.343(L) 7.410 7.354 -    PCO2ART 32.0 - 48.0 mmHg 40.2 43.9 40.2 40.0 -   HCO3 20.0 - 28.0 mmol/L 25.5 23.9 25.6 22.3 -   TCO2 22 - 32 mmol/L 27 25 27 24 25    ACIDBASEDEF 0.0 - 2.0 mmol/L - 2.0 - 3.0(H) -   O2SAT % 99.0 98.0 97.0 95.0 -      Capillary Blood Glucose: Lab Results  Component Value Date   GLUCAP 116 (H) 02/08/2018   GLUCAP 122 (H) 02/07/2018   GLUCAP 103 (H) 02/07/2018   GLUCAP 127 (H) 02/07/2018   GLUCAP 151 (H) 02/07/2018     Exercise Target Goals: Exercise Program Goal: Individual exercise prescription set using results from initial 6 min walk test and THRR while considering  patient's activity barriers and safety.   Exercise Prescription Goal: Initial exercise prescription builds to 30-45 minutes a day of aerobic activity, 2-3 days per week.  Home exercise guidelines will be given to patient during program as part of exercise prescription that the participant will acknowledge.  Activity Barriers & Risk Stratification: Activity Barriers & Cardiac Risk Stratification - 03/29/18 1034      Activity Barriers & Cardiac Risk Stratification   Activity Barriers  None    Cardiac Risk Stratification  High       6 Minute Walk: 6 Minute Walk    Row Name 03/29/18 1033         6 Minute Walk   Phase  Initial     Distance  1364 feet     Walk Time  6 minutes     # of Rest Breaks  0     MPH  2.6     METS  2.6     RPE  11     Perceived Dyspnea   0     VO2 Peak  9.1     Symptoms  No     Resting HR  64 bpm     Resting BP  110/62     Resting Oxygen Saturation   97 %     Exercise Oxygen Saturation  during 6 min walk  98 %     Max Ex. HR  83 bpm     Max Ex. BP  138/70     2 Minute Post BP  108/58        Oxygen Initial Assessment:   Oxygen Re-Evaluation:   Oxygen Discharge (Final Oxygen Re-Evaluation):   Initial Exercise Prescription: Initial Exercise Prescription - 03/29/18 1000      Date of Initial Exercise RX and Referring Provider   Date  03/29/18  Referring Provider   Candee Furbish, MD     Expected Discharge Date  07/04/18      Treadmill   MPH  1.8    Grade  1    Minutes  10    METs  2.63      Recumbant Bike   Level  1.5    Watts  15    Minutes  10    METs  2.5      NuStep   Level  2    SPM  85    Minutes  10    METs  2.5      Prescription Details   Frequency (times per week)  3x    Duration  Progress to 30 minutes of continuous aerobic without signs/symptoms of physical distress      Intensity   THRR 40-80% of Max Heartrate  11/11/1940    Ratings of Perceived Exertion  11-13    Perceived Dyspnea  0-4      Progression   Progression  Continue progressive overload as per policy without signs/symptoms or physical distress.      Resistance Training   Training Prescription  Yes    Weight  4lbs    Reps  10-15       Perform Capillary Blood Glucose checks as needed.  Exercise Prescription Changes:   Exercise Comments:   Exercise Goals and Review: Exercise Goals    Row Name 03/29/18 1035             Exercise Goals   Increase Physical Activity  Yes       Intervention  Provide advice, education, support and counseling about physical activity/exercise needs.;Develop an individualized exercise prescription for aerobic and resistive training based on initial evaluation findings, risk stratification, comorbidities and participant's personal goals.       Expected Outcomes  Short Term: Attend rehab on a regular basis to increase amount of physical activity.;Long Term: Add in home exercise to make exercise part of routine and to increase amount of physical activity.;Long Term: Exercising regularly at least 3-5 days a week.       Increase Strength and Stamina  Yes       Intervention  Provide advice, education, support and counseling about physical activity/exercise needs.;Develop an individualized exercise prescription for aerobic and resistive training based on initial evaluation findings, risk stratification, comorbidities and  participant's personal goals.       Expected Outcomes  Short Term: Increase workloads from initial exercise prescription for resistance, speed, and METs.;Short Term: Perform resistance training exercises routinely during rehab and add in resistance training at home;Long Term: Improve cardiorespiratory fitness, muscular endurance and strength as measured by increased METs and functional capacity (6MWT)       Able to understand and use rate of perceived exertion (RPE) scale  Yes       Intervention  Provide education and explanation on how to use RPE scale       Expected Outcomes  Short Term: Able to use RPE daily in rehab to express subjective intensity level;Long Term:  Able to use RPE to guide intensity level when exercising independently       Knowledge and understanding of Target Heart Rate Range (THRR)  Yes       Intervention  Provide education and explanation of THRR including how the numbers were predicted and where they are located for reference       Expected Outcomes  Short Term: Able to state/look up THRR;Short Term: Able  to use daily as guideline for intensity in rehab;Long Term: Able to use THRR to govern intensity when exercising independently       Able to check pulse independently  Yes       Intervention  Provide education and demonstration on how to check pulse in carotid and radial arteries.;Review the importance of being able to check your own pulse for safety during independent exercise       Expected Outcomes  Short Term: Able to explain why pulse checking is important during independent exercise;Long Term: Able to check pulse independently and accurately       Understanding of Exercise Prescription  Yes       Intervention  Provide education, explanation, and written materials on patient's individual exercise prescription       Expected Outcomes  Short Term: Able to explain program exercise prescription;Long Term: Able to explain home exercise prescription to exercise independently           Exercise Goals Re-Evaluation :   Discharge Exercise Prescription (Final Exercise Prescription Changes):   Nutrition:  Target Goals: Understanding of nutrition guidelines, daily intake of sodium 1500mg , cholesterol 200mg , calories 30% from fat and 7% or less from saturated fats, daily to have 5 or more servings of fruits and vegetables.  Biometrics: Pre Biometrics - 03/29/18 1034      Pre Biometrics   Height  5' 6.5" (1.689 m)    Weight  73.4 kg    Waist Circumference  37.5 inches    Hip Circumference  36.5 inches    Waist to Hip Ratio  1.03 %    BMI (Calculated)  25.73    Triceps Skinfold  20 mm    % Body Fat  27.3 %    Grip Strength  30 kg    Flexibility  0 in    Single Leg Stand  30 seconds        Nutrition Therapy Plan and Nutrition Goals: Nutrition Therapy & Goals - 03/29/18 1116      Nutrition Therapy   Diet  heart healthy      Personal Nutrition Goals   Nutrition Goal  to be determined       Nutrition Assessments: Nutrition Assessments - 03/29/18 1116      MEDFICTS Scores   Pre Score  52       Nutrition Goals Re-Evaluation:   Nutrition Goals Re-Evaluation:   Nutrition Goals Discharge (Final Nutrition Goals Re-Evaluation):   Psychosocial: Target Goals: Acknowledge presence or absence of significant depression and/or stress, maximize coping skills, provide positive support system. Participant is able to verbalize types and ability to use techniques and skills needed for reducing stress and depression.  Initial Review & Psychosocial Screening: Initial Psych Review & Screening - 03/29/18 1011      Initial Review   Current issues with  None Identified      Family Dynamics   Good Support System?  Yes   Pt lists his family, friends, and significant other as sources of support.      Barriers   Psychosocial barriers to participate in program  There are no identifiable barriers or psychosocial needs.      Screening Interventions    Interventions  Encouraged to exercise       Quality of Life Scores: Quality of Life - 03/29/18 1014      Quality of Life   Select  Quality of Life      Quality of Life Scores   Health/Function Pre  28.2 %    Socioeconomic Pre  24.92 %    Psych/Spiritual Pre  27.43 %    Family Pre  27.6 %    GLOBAL Pre  27.2 %      Scores of 19 and below usually indicate a poorer quality of life in these areas.  A difference of  2-3 points is a clinically meaningful difference.  A difference of 2-3 points in the total score of the Quality of Life Index has been associated with significant improvement in overall quality of life, self-image, physical symptoms, and general health in studies assessing change in quality of life.  PHQ-9: Recent Review Flowsheet Data    Depression screen [facility] 2/9 11/14/2017 11/14/2017 11/14/2017 04/11/2017 06/30/2016   Decreased Interest 0 0 0 0 0   Down, Depressed, Hopeless 0 0 0 0 0   PHQ - 2 Score 0 0 0 0 0     Interpretation of Total Score  Total Score Depression Severity:  1-4 = Minimal depression, 5-9 = Mild depression, 10-14 = Moderate depression, 15-19 = Moderately severe depression, 20-27 = Severe depression   Psychosocial Evaluation and Intervention:   Psychosocial Re-Evaluation:   Psychosocial Discharge (Final Psychosocial Re-Evaluation):   Vocational Rehabilitation: Provide vocational rehab assistance to qualifying candidates.   Vocational Rehab Evaluation & Intervention: Vocational Rehab - 03/29/18 1015      Initial Vocational Rehab Evaluation & Intervention   Assessment shows need for Vocational Rehabilitation  No       Education: Education Goals: Education classes will be provided on a weekly basis, covering required topics. Participant will state understanding/return demonstration of topics presented.  Learning Barriers/Preferences: Learning Barriers/Preferences - 03/29/18 1049      Learning Barriers/Preferences   Learning Barriers   None    Learning Preferences  Skilled Demonstration;Individual Instruction;Written Material       Education Topics: Count Your Pulse:  -Group instruction provided by verbal instruction, demonstration, patient participation and written materials to support subject.  Instructors address importance of being able to find your pulse and how to count your pulse when at home without a heart monitor.  Patients get hands on experience counting their pulse with staff help and individually.   Heart Attack, Angina, and Risk Factor Modification:  -Group instruction provided by verbal instruction, video, and written materials to support subject.  Instructors address signs and symptoms of angina and heart attacks.    Also discuss risk factors for heart disease and how to make changes to improve heart health risk factors.   Functional Fitness:  -Group instruction provided by verbal instruction, demonstration, patient participation, and written materials to support subject.  Instructors address safety measures for doing things around the house.  Discuss how to get up and down off the floor, how to pick things up properly, how to safely get out of a chair without assistance, and balance training.   Meditation and Mindfulness:  -Group instruction provided by verbal instruction, patient participation, and written materials to support subject.  Instructor addresses importance of mindfulness and meditation practice to help reduce stress and improve awareness.  Instructor also leads participants through a meditation exercise.    Stretching for Flexibility and Mobility:  -Group instruction provided by verbal instruction, patient participation, and written materials to support subject.  Instructors lead participants through series of stretches that are designed to increase flexibility thus improving mobility.  These stretches are additional exercise for major muscle groups that are typically performed during regular  warm up and cool down.  Hands Only CPR:  -Group verbal, video, and participation provides a basic overview of AHA guidelines for community CPR. Role-play of emergencies allow participants the opportunity to practice calling for help and chest compression technique with discussion of AED use.   Hypertension: -Group verbal and written instruction that provides a basic overview of hypertension including the most recent diagnostic guidelines, risk factor reduction with self-care instructions and medication management.    Nutrition I class: Heart Healthy Eating:  -Group instruction provided by PowerPoint slides, verbal discussion, and written materials to support subject matter. The instructor gives an explanation and review of the Therapeutic Lifestyle Changes diet recommendations, which includes a discussion on lipid goals, dietary fat, sodium, fiber, plant stanol/sterol esters, sugar, and the components of a well-balanced, healthy diet.   Nutrition II class: Lifestyle Skills:  -Group instruction provided by PowerPoint slides, verbal discussion, and written materials to support subject matter. The instructor gives an explanation and review of label reading, grocery shopping for heart health, heart healthy recipe modifications, and ways to make healthier choices when eating out.   Diabetes Question & Answer:  -Group instruction provided by PowerPoint slides, verbal discussion, and written materials to support subject matter. The instructor gives an explanation and review of diabetes co-morbidities, pre- and post-prandial blood glucose goals, pre-exercise blood glucose goals, signs, symptoms, and treatment of hypoglycemia and hyperglycemia, and foot care basics.   Diabetes Blitz:  -Group instruction provided by PowerPoint slides, verbal discussion, and written materials to support subject matter. The instructor gives an explanation and review of the physiology behind type 1 and type 2 diabetes,  diabetes medications and rational behind using different medications, pre- and post-prandial blood glucose recommendations and Hemoglobin A1c goals, diabetes diet, and exercise including blood glucose guidelines for exercising safely.    Portion Distortion:  -Group instruction provided by PowerPoint slides, verbal discussion, written materials, and food models to support subject matter. The instructor gives an explanation of serving size versus portion size, changes in portions sizes over the last 20 years, and what consists of a serving from each food group.   Stress Management:  -Group instruction provided by verbal instruction, video, and written materials to support subject matter.  Instructors review role of stress in heart disease and how to cope with stress positively.     Exercising on Your Own:  -Group instruction provided by verbal instruction, power point, and written materials to support subject.  Instructors discuss benefits of exercise, components of exercise, frequency and intensity of exercise, and end points for exercise.  Also discuss use of nitroglycerin and activating EMS.  Review options of places to exercise outside of rehab.  Review guidelines for sex with heart disease.   Cardiac Drugs I:  -Group instruction provided by verbal instruction and written materials to support subject.  Instructor reviews cardiac drug classes: antiplatelets, anticoagulants, beta blockers, and statins.  Instructor discusses reasons, side effects, and lifestyle considerations for each drug class.   Cardiac Drugs II:  -Group instruction provided by verbal instruction and written materials to support subject.  Instructor reviews cardiac drug classes: angiotensin converting enzyme inhibitors (ACE-I), angiotensin II receptor blockers (ARBs), nitrates, and calcium channel blockers.  Instructor discusses reasons, side effects, and lifestyle considerations for each drug class.   Anatomy and Physiology  of the Circulatory System:  Group verbal and written instruction and models provide basic cardiac anatomy and physiology, with the coronary electrical and arterial systems. Review of: AMI, Angina, Valve disease, Heart Failure, Peripheral Artery Disease, Cardiac Arrhythmia,  Pacemakers, and the ICD.   Other Education:  -Group or individual verbal, written, or video instructions that support the educational goals of the cardiac rehab program.   Holiday Eating Survival Tips:  -Group instruction provided by PowerPoint slides, verbal discussion, and written materials to support subject matter. The instructor gives patients tips, tricks, and techniques to help them not only survive but enjoy the holidays despite the onslaught of food that accompanies the holidays.   Knowledge Questionnaire Score: Knowledge Questionnaire Score - 03/29/18 1015      Knowledge Questionnaire Score   Pre Score  21/24       Core Components/Risk Factors/Patient Goals at Admission: Personal Goals and Risk Factors at Admission - 03/29/18 1050      Core Components/Risk Factors/Patient Goals on Admission    Weight Management  Yes;Weight Maintenance    Intervention  Weight Management: Develop a combined nutrition and exercise program designed to reach desired caloric intake, while maintaining appropriate intake of nutrient and fiber, sodium and fats, and appropriate energy expenditure required for the weight goal.;Weight Management: Provide education and appropriate resources to help participant work on and attain dietary goals.;Weight Management/Obesity: Establish reasonable short term and long term weight goals.    Admit Weight  161 lb 13.1 oz (73.4 kg)    Expected Outcomes  Short Term: Continue to assess and modify interventions until short term weight is achieved;Long Term: Adherence to nutrition and physical activity/exercise program aimed toward attainment of established weight goal;Weight Maintenance: Understanding of  the daily nutrition guidelines, which includes 25-35% calories from fat, 7% or less cal from saturated fats, less than 200mg  cholesterol, less than 1.5gm of sodium, & 5 or more servings of fruits and vegetables daily;Understanding recommendations for meals to include 15-35% energy as protein, 25-35% energy from fat, 35-60% energy from carbohydrates, less than 200mg  of dietary cholesterol, 20-35 gm of total fiber daily;Understanding of distribution of calorie intake throughout the day with the consumption of 4-5 meals/snacks    Hypertension  Yes    Intervention  Provide education on lifestyle modifcations including regular physical activity/exercise, weight management, moderate sodium restriction and increased consumption of fresh fruit, vegetables, and low fat dairy, alcohol moderation, and smoking cessation.;Monitor prescription use compliance.    Expected Outcomes  Short Term: Continued assessment and intervention until BP is < 140/39mm HG in hypertensive participants. < 130/89mm HG in hypertensive participants with diabetes, heart failure or chronic kidney disease.;Long Term: Maintenance of blood pressure at goal levels.    Lipids  Yes    Intervention  Provide education and support for participant on nutrition & aerobic/resistive exercise along with prescribed medications to achieve LDL 70mg , HDL >40mg .    Expected Outcomes  Short Term: Participant states understanding of desired cholesterol values and is compliant with medications prescribed. Participant is following exercise prescription and nutrition guidelines.;Long Term: Cholesterol controlled with medications as prescribed, with individualized exercise RX and with personalized nutrition plan. Value goals: LDL < 70mg , HDL > 40 mg.       Core Components/Risk Factors/Patient Goals Review:    Core Components/Risk Factors/Patient Goals at Discharge (Final Review):    ITP Comments: ITP Comments    Row Name 03/29/18 1009           ITP  Comments  Dr. Fransico Him, Medical Director          Comments: Yvone Neu attended orientation from [phone] to [phone] to review rules and guidelines for program. Completed 6 minute walk test, Intitial ITP, and exercise prescription.  VSS. Telemetry-Sinus Rhythm with an inverted T wave. This has been previously documented. Asymptomatic.Barnet Pall, RN,BSN 03/29/2018 12:08 PM

## 2018-04-02 ENCOUNTER — Other Ambulatory Visit: Payer: Self-pay | Admitting: Family Medicine

## 2018-04-02 ENCOUNTER — Telehealth (HOSPITAL_COMMUNITY): Payer: Self-pay | Admitting: *Deleted

## 2018-04-02 NOTE — Telephone Encounter (Signed)
Pt called and updated about closure of cardiac rehab for two weeks.  Pt was scheduled to start cardiac rehab on 04/04/2018.  This is postponed until 04/16/2018.  Pt verbalized understanding and was appreciative of call.

## 2018-04-02 NOTE — Progress Notes (Addendum)
ZETHAN ALFIERI 78 y.o. male DOB: Jun 05, 1940 MRN: [id number]      Nutrition Note  1. NSTEMI (non-ST elevated myocardial infarction) ([place]) 02/02/18   2. S/P CABG x 4 02/05/18    Past Medical History:  Diagnosis Date  . Abdominal distension    UMBILICAL HERNIA-NO PAIN  . Bleeding from the nose    SEVERAL EPISODES THAT REQUIRED TX IN ER--LAST EPISODE WAS APPROX 1 YR AGO  . Blood in urine    MICROSCOPIC BLOODEVALUATED BY DR. NESI FRIDAY 02/11/11--URINE SAMPLE AND EXAM-- AND BLOOD SAMPLE AND TO BE SEEN AGAIN IN 6 MONTHS AND GIVEN VESICARE  FOR FREQUENCY AND URGENCY  . Coronary artery disease   . GERD (gastroesophageal reflux disease)   . Hyperlipidemia   . Hypertension    Meds reviewed.    Current Outpatient Medications (Cardiovascular):  .  atorvastatin (LIPITOR) 80 MG tablet, Take 1 tablet (80 mg total) by mouth daily at 6 PM. .  furosemide (LASIX) 40 MG tablet, Take 1 tablet (40 mg total) by mouth daily as needed. Marland Kitchen  losartan (COZAAR) 50 MG tablet, Take 1 tablet (50 mg total) by mouth daily. .  metoprolol succinate (TOPROL-XL) 50 MG 24 hr tablet, Take 1 tablet (50 mg total) by mouth daily. Take with or immediately following a meal.   Current Outpatient Medications (Analgesics):  .  acetaminophen (TYLENOL) 325 MG tablet, Take 2 tablets (650 mg total) by mouth every 6 (six) hours as needed for mild pain. Marland Kitchen  aspirin EC 81 MG tablet, Take 81 mg by mouth daily. .  traMADol (ULTRAM) 50 MG tablet, Take 1 tablet (50 mg total) by mouth every 6 (six) hours as needed for moderate pain. (Patient not taking: Reported on 03/26/2018)  Current Outpatient Medications (Hematological):  .  clopidogrel (PLAVIX) 75 MG tablet, Take 1 tablet (75 mg total) by mouth daily. .  Cyanocobalamin (VITAMIN B-12 PO), Take 1 tablet by mouth daily.  Current Outpatient Medications (Other):  .  latanoprost (XALATAN) 0.005 % ophthalmic solution, Place 1 drop into both eyes at bedtime.  .  Multiple Vitamin  (MULTIVITAMIN) tablet, Take 1 tablet by mouth daily. Marland Kitchen  NEXIUM 40 MG capsule, TAKE 1 CAPSULE (40 MG TOTAL) BY MOUTH DAILY. Marland Kitchen  potassium chloride (K-DUR) 10 MEQ tablet, Take 2 tablets (20 mEq total) by mouth 2 (two) times daily. (Patient not taking: Reported on 03/26/2018) .  solifenacin (VESICARE) 5 MG tablet, Take 5 mg by mouth daily. .  timolol (TIMOPTIC) 0.5 % ophthalmic solution, Place 1 drop into both eyes daily. Marland Kitchen  triamcinolone cream (KENALOG) 0.1 %, Apply 1 application topically 2 (two) times daily as needed. (Patient not taking: Reported on 03/26/2018) .  valACYclovir (VALTREX) 1000 MG tablet, Take 2 tablets (2,000 mg total) by mouth 2 (two) times daily. X 1 day (Patient not taking: Reported on 03/26/2018)  HT: Ht Readings from Last 1 Encounters:  03/29/18 5' 6.5" (1.689 m)    WT: Wt Readings from Last 5 Encounters:  03/29/18 161 lb 13.1 oz (73.4 kg)  03/06/18 158 lb (71.7 kg)  03/05/18 157 lb (71.2 kg)  02/21/18 177 lb 1.9 oz (80.3 kg)  02/10/18 179 lb 4.8 oz (81.3 kg)     Body mass index is 25.73 kg/m.   Current tobacco use? No  Labs:  Lipid Panel     Component Value Date/Time   CHOL 194 02/03/2018 0500   TRIG 127 02/03/2018 0500   HDL 59 02/03/2018 0500   CHOLHDL 3.3 02/03/2018 0500  VLDL 25 02/03/2018 0500   LDLCALC 110 (H) 02/03/2018 0500   LDLCALC 97 11/07/2017 1017    Lab Results  Component Value Date   HGBA1C 5.5 02/02/2018   CBG (last 3)  No results for input(s): GLUCAP in the last 72 hours.  Nutrition Note Spoke with pt. Nutrition plan and goals reviewed with pt. Pt is following Step 1 of the Therapeutic Lifestyle Changes diet. Pt wants to lose wt. Pt would like to maintain weight. Heart healthy weight maintenance tips reviewed (label reading, how to build a healthy plate, portion sizes, eating frequently across the day). Pt last A1C was nearing the cusp of prediabetes, 5.5, 02/02/2018. Discussed the differences between complex and refined carbs, recommended  pt replace refined carbs with complex. Reviewed the benefits swapping in complex carbs and moderating portion sizes can have on managing blood glucose with patient. Per discussion, pt does not use canned/convenience foods often. Pt does not add salt to food. Pt does not eat out frequently. Pt expressed understanding of the information reviewed. Pt aware of nutrition education classes offered and would like to attend nutrition classes.  Nutrition Diagnosis ? Food-and nutrition-related knowledge deficit related to lack of exposure to information as related to diagnosis of: ? CVD   Nutrition Intervention ? Pt's individual nutrition plan and goals reviewed with pt. ? Pt given handouts for: ? Nutrition I class ? Nutrition II class   Nutrition Goal(s):  ? Pt to identify and limit food sources of saturated fat, trans fat, refined carbohydrates and sodium. ? Pt to identify food quantities necessary to achieve weight loss of 6-24 lb at graduation from cardiac rehab.  ? Pt to build a healthy plate including vegetables, fruits, whole grains, and low-fat dairy products in a heart healthy meal plan. ? Pt able to name foods that affect blood glucose.  Plan:  ? Pt to attend nutrition classes:  ? Nutrition I ? Nutrition II ? Portion Distortion ? Will provide client-centered nutrition education as part of interdisciplinary care ? Monitor and evaluate progress toward nutrition goal with team.   Laurina Bustle, MS, RD, LDN 04/02/2018 10:46 AM

## 2018-04-04 ENCOUNTER — Telehealth (HOSPITAL_COMMUNITY): Payer: Self-pay

## 2018-04-04 ENCOUNTER — Ambulatory Visit (HOSPITAL_COMMUNITY): Payer: Medicare Other

## 2018-04-04 NOTE — Progress Notes (Signed)
I have reviewed a Home Exercise Prescription with Alex Martin . [name] is currently exercising at home.  The patient was advised to walk 5-7 days a week for 30-45 minutes.  [name] and I discussed how to progress their exercise prescription.  The patient stated that their goals were to continue walking on the treadmill 5-7 days per week for 30-45 minutes.  The patient stated that they understand the exercise prescription.  We reviewed exercise guidelines, target heart rate during exercise, RPE Scale, weather conditions, NTG use, endpoints for exercise, warmup and cool down.  Patient is encouraged to come to me with any questions. I will continue to follow up with the patient to assist them with progression and safety.    04/04/2018 10:26 AM  Deitra Mayo BS, ACSM CEP

## 2018-04-04 NOTE — Telephone Encounter (Signed)
Phone call made to Pt to discuss home exercise guidelines. Pt was responsive and understands goals given in regards to home exercise. Pt requested documents be sent to another address other than that documented in Pt chart. Repeated address back to Pt and confirmed address to send home exercise guidelines to.

## 2018-04-06 ENCOUNTER — Ambulatory Visit (HOSPITAL_COMMUNITY): Payer: Medicare Other

## 2018-04-09 ENCOUNTER — Ambulatory Visit (HOSPITAL_COMMUNITY): Payer: Medicare Other

## 2018-04-10 ENCOUNTER — Encounter (HOSPITAL_COMMUNITY): Payer: Self-pay | Admitting: Cardiac Rehabilitation

## 2018-04-10 ENCOUNTER — Telehealth (HOSPITAL_COMMUNITY): Payer: Self-pay | Admitting: Cardiac Rehabilitation

## 2018-04-10 DIAGNOSIS — Z951 Presence of aortocoronary bypass graft: Secondary | ICD-10-CM

## 2018-04-10 DIAGNOSIS — I214 Non-ST elevation (NSTEMI) myocardial infarction: Secondary | ICD-10-CM

## 2018-04-10 NOTE — Telephone Encounter (Signed)
Phone call to pt to advise of outpatient cardiac rehab departmental closing additional 30 days for COVID 19 precautions.  Understanding verbalized. Joann Rion, RN, BSN Cardiac Pulmonary Rehab 

## 2018-04-10 NOTE — Progress Notes (Signed)
Cardiac Individual Treatment Plan  Patient Details  Name: Alex Martin MRN: [id number] Date of Birth: 07/12/1940 Referring Provider:   Flowsheet Row CARDIAC REHAB PHASE II ORIENTATION from 03/29/2018 in [facility]  Referring Provider  Candee Furbish, MD       Initial Encounter Date:  Flowsheet Row CARDIAC REHAB PHASE II ORIENTATION from 03/29/2018 in [facility]  Date  03/29/18      Visit Diagnosis: NSTEMI (non-ST elevated myocardial infarction) ([place]) 02/02/18  S/P CABG x 4 02/05/18  Patient's Home Medications on Admission:  Current Outpatient Medications:  .  acetaminophen (TYLENOL) 325 MG tablet, Take 2 tablets (650 mg total) by mouth every 6 (six) hours as needed for mild pain., Disp: , Rfl:  .  aspirin EC 81 MG tablet, Take 81 mg by mouth daily., Disp: , Rfl:  .  atorvastatin (LIPITOR) 80 MG tablet, Take 1 tablet (80 mg total) by mouth daily at 6 PM., Disp: 90 tablet, Rfl: 3 .  clopidogrel (PLAVIX) 75 MG tablet, Take 1 tablet (75 mg total) by mouth daily., Disp: 90 tablet, Rfl: 3 .  Cyanocobalamin (VITAMIN B-12 PO), Take 1 tablet by mouth daily., Disp: , Rfl:  .  furosemide (LASIX) 40 MG tablet, Take 1 tablet (40 mg total) by mouth daily as needed., Disp: , Rfl:  .  latanoprost (XALATAN) 0.005 % ophthalmic solution, Place 1 drop into both eyes at bedtime. , Disp: , Rfl: 0 .  losartan (COZAAR) 50 MG tablet, Take 1 tablet (50 mg total) by mouth daily., Disp: 90 tablet, Rfl: 3 .  metoprolol succinate (TOPROL-XL) 50 MG 24 hr tablet, Take 1 tablet (50 mg total) by mouth daily. Take with or immediately following a meal., Disp: 90 tablet, Rfl: 3 .  Multiple Vitamin (MULTIVITAMIN) tablet, Take 1 tablet by mouth daily., Disp: , Rfl:  .  NEXIUM 40 MG capsule, TAKE 1 CAPSULE BY MOUTH EVERY DAY, Disp: 90 capsule, Rfl: 3 .  potassium chloride (K-DUR) 10 MEQ tablet, Take 2 tablets (20 mEq total) by mouth 2 (two) times daily.  (Patient not taking: Reported on 03/26/2018), Disp: 28 tablet, Rfl: 2 .  solifenacin (VESICARE) 5 MG tablet, Take 5 mg by mouth daily., Disp: , Rfl:  .  timolol (TIMOPTIC) 0.5 % ophthalmic solution, Place 1 drop into both eyes daily., Disp: , Rfl:  .  traMADol (ULTRAM) 50 MG tablet, Take 1 tablet (50 mg total) by mouth every 6 (six) hours as needed for moderate pain. (Patient not taking: Reported on 03/26/2018), Disp: 30 tablet, Rfl: 0 .  triamcinolone cream (KENALOG) 0.1 %, Apply 1 application topically 2 (two) times daily as needed. (Patient not taking: Reported on 03/26/2018), Disp: 30 g, Rfl: 0 .  valACYclovir (VALTREX) 1000 MG tablet, Take 2 tablets (2,000 mg total) by mouth 2 (two) times daily. X 1 day (Patient not taking: Reported on 03/26/2018), Disp: 8 tablet, Rfl: 3  Past Medical History: Past Medical History:  Diagnosis Date  . Abdominal distension    UMBILICAL HERNIA-NO PAIN  . Bleeding from the nose    SEVERAL EPISODES THAT REQUIRED TX IN ER--LAST EPISODE WAS APPROX 1 YR AGO  . Blood in urine    MICROSCOPIC BLOODEVALUATED BY DR. NESI FRIDAY 02/11/11--URINE SAMPLE AND EXAM-- AND BLOOD SAMPLE AND TO BE SEEN AGAIN IN 6 MONTHS AND GIVEN VESICARE  FOR FREQUENCY AND URGENCY  . Coronary artery disease   . GERD (gastroesophageal reflux disease)   . Hyperlipidemia   .  Hypertension     Tobacco Use: Social History   Tobacco Use  Smoking Status Former Smoker  . Years: 10.00  . Last attempt to quit: 02/22/1992  . Years since quitting: 26.1  Smokeless Tobacco Never Used  Tobacco Comment   QUIT SMOKING ABOUT 1994 - 1 PP WEEK    Labs: Recent Review Flowsheet Data    Labs for ITP Cardiac and Pulmonary Rehab Latest Ref Rng & Units 02/05/2018 02/05/2018 02/05/2018 02/05/2018 02/05/2018   Cholestrol 0 - 200 mg/dL - - - - -   LDLCALC 0 - 99 mg/dL - - - - -   HDL >40 mg/dL - - - - -   Trlycerides <150 mg/dL - - - - -   Hemoglobin A1c 4.8 - 5.6 % - - - - -   PHART 7.350 - 7.450 7.411 7.343(L) 7.410  7.354 -   PCO2ART 32.0 - 48.0 mmHg 40.2 43.9 40.2 40.0 -   HCO3 20.0 - 28.0 mmol/L 25.5 23.9 25.6 22.3 -   TCO2 22 - 32 mmol/L 27 25 27 24 25    ACIDBASEDEF 0.0 - 2.0 mmol/L - 2.0 - 3.0(H) -   O2SAT % 99.0 98.0 97.0 95.0 -      Capillary Blood Glucose: Lab Results  Component Value Date   GLUCAP 116 (H) 02/08/2018   GLUCAP 122 (H) 02/07/2018   GLUCAP 103 (H) 02/07/2018   GLUCAP 127 (H) 02/07/2018   GLUCAP 151 (H) 02/07/2018     Exercise Target Goals: Exercise Program Goal: Individual exercise prescription set using results from initial 6 min walk test and THRR while considering  patient's activity barriers and safety.   Exercise Prescription Goal: Initial exercise prescription builds to 30-45 minutes a day of aerobic activity, 2-3 days per week.  Home exercise guidelines will be given to patient during program as part of exercise prescription that the participant will acknowledge.  Activity Barriers & Risk Stratification: Activity Barriers & Cardiac Risk Stratification - 03/29/18 1034    Activity Barriers & Cardiac Risk Stratification          Activity Barriers  None    Cardiac Risk Stratification  High           6 Minute Walk: 6 Minute Walk    6 Minute Walk    Row Name 03/29/18 1033   Phase  Initial   Distance  1364 feet   Walk Time  6 minutes   # of Rest Breaks  0   MPH  2.6   METS  2.6   RPE  11   Perceived Dyspnea   0   VO2 Peak  9.1   Symptoms  No   Resting HR  64 bpm   Resting BP  110/62   Resting Oxygen Saturation   97 %   Exercise Oxygen Saturation  during 6 min walk  98 %   Max Ex. HR  83 bpm   Max Ex. BP  138/70   2 Minute Post BP  108/58          Oxygen Initial Assessment:   Oxygen Re-Evaluation:   Oxygen Discharge (Final Oxygen Re-Evaluation):   Initial Exercise Prescription: Initial Exercise Prescription - 03/29/18 1000    Date of Initial Exercise RX and Referring Provider          Date  03/29/18    Referring Provider  Candee Furbish, MD     Expected Discharge Date  07/04/18        Treadmill  MPH  1.8    Grade  1    Minutes  10    METs  2.63        Recumbant Bike          Level  1.5    Watts  15    Minutes  10    METs  2.5        NuStep          Level  2    SPM  85    Minutes  10    METs  2.5        Prescription Details          Frequency (times per week)  3x    Duration  Progress to 30 minutes of continuous aerobic without signs/symptoms of physical distress        Intensity          THRR 40-80% of Max Heartrate  08/10/40    Ratings of Perceived Exertion  11-13    Perceived Dyspnea  0-4        Progression          Progression  Continue progressive overload as per policy without signs/symptoms or physical distress.        Resistance Training          Training Prescription  Yes    Weight  4lbs    Reps  10-15           Perform Capillary Blood Glucose checks as needed.  Exercise Prescription Changes: Exercise Prescription Changes    Home Exercise Plan    [place] Name 04/04/18 1000   Plans to continue exercise at  Home (comment)   Frequency  Add 4 additional days to program exercise sessions.   Initial Home Exercises Provided  04/04/18          Exercise Comments: Exercise Comments    Row Name 04/04/18 1028   Exercise Comments  Reviewed HEP with Pt. Pt was responsive and understands goals.       Exercise Goals and Review: Exercise Goals    Exercise Goals    Row Name 03/29/18 1035   Increase Physical Activity  Yes   Intervention  Provide advice, education, support and counseling about physical activity/exercise needs.;Develop an individualized exercise prescription for aerobic and resistive training based on initial evaluation findings, risk stratification, comorbidities and participant's personal goals.   Expected Outcomes  Short Term: Attend rehab on a regular basis to increase amount of physical activity.;Long Term: Add in home exercise to make exercise  part of routine and to increase amount of physical activity.;Long Term: Exercising regularly at least 3-5 days a week.   Increase Strength and Stamina  Yes   Intervention  Provide advice, education, support and counseling about physical activity/exercise needs.;Develop an individualized exercise prescription for aerobic and resistive training based on initial evaluation findings, risk stratification, comorbidities and participant's personal goals.   Expected Outcomes  Short Term: Increase workloads from initial exercise prescription for resistance, speed, and METs.;Short Term: Perform resistance training exercises routinely during rehab and add in resistance training at home;Long Term: Improve cardiorespiratory fitness, muscular endurance and strength as measured by increased METs and functional capacity (6MWT)   Able to understand and use rate of perceived exertion (RPE) scale  Yes   Intervention  Provide education and explanation on how to use RPE scale   Expected Outcomes  Short Term: Able to use RPE daily in rehab to express subjective intensity level;Long Term:  Able to use RPE to guide intensity level when exercising independently   Knowledge and understanding of Target Heart Rate Range (THRR)  Yes   Intervention  Provide education and explanation of THRR including how the numbers were predicted and where they are located for reference   Expected Outcomes  Short Term: Able to state/look up THRR;Short Term: Able to use daily as guideline for intensity in rehab;Long Term: Able to use THRR to govern intensity when exercising independently   Able to check pulse independently  Yes   Intervention  Provide education and demonstration on how to check pulse in carotid and radial arteries.;Review the importance of being able to check your own pulse for safety during independent exercise   Expected Outcomes  Short Term: Able to explain why pulse checking is important during independent exercise;Long Term:  Able to check pulse independently and accurately   Understanding of Exercise Prescription  Yes   Intervention  Provide education, explanation, and written materials on patient's individual exercise prescription   Expected Outcomes  Short Term: Able to explain program exercise prescription;Long Term: Able to explain home exercise prescription to exercise independently          Exercise Goals Re-Evaluation : Exercise Goals Re-Evaluation    Exercise Goal Re-Evaluation    Row Name 04/04/18 1027   Exercise Goals Review  Increase Physical Activity;Increase Strength and Stamina;Able to check pulse independently;Able to understand and use rate of perceived exertion (RPE) scale;Knowledge and understanding of Target Heart Rate Range (THRR);Understanding of Exercise Prescription   Comments  Reviewed HEP with Pt. Pt was responsive and understands THRR, RPE scale, end points of exercise, and weather precautions. Pt is currently walking at home for 30-45 minutes on a treadmill 5-7 days per week.    Expected Outcomes  Will continue to monitor and progress Pt as tolerated.           Discharge Exercise Prescription (Final Exercise Prescription Changes): Exercise Prescription Changes - 04/04/18 1000    Home Exercise Plan          Plans to continue exercise at  Home (comment)    Frequency  Add 4 additional days to program exercise sessions.    Initial Home Exercises Provided  04/04/18           Nutrition:  Target Goals: Understanding of nutrition guidelines, daily intake of sodium 1500mg , cholesterol 200mg , calories 30% from fat and 7% or less from saturated fats, daily to have 5 or more servings of fruits and vegetables.  Biometrics: Pre Biometrics - 03/29/18 1034    Pre Biometrics          Height  5' 6.5" (1.689 m)    Weight  161 lb 13.1 oz (73.4 kg)    Waist Circumference  37.5 inches    Hip Circumference  36.5 inches    Waist to Hip Ratio  1.03 %    BMI (Calculated)  25.73    Triceps  Skinfold  20 mm    % Body Fat  27.3 %    Grip Strength  30 kg    Flexibility  0 in    Single Leg Stand  30 seconds            Nutrition Therapy Plan and Nutrition Goals: Nutrition Therapy & Goals - 03/29/18 1116    Nutrition Therapy          Diet  heart healthy        Personal Nutrition Goals  Nutrition Goal  Pt to identify and limit food sources of saturated fat, trans fat, refined carbohydrates and sodium    Personal Goal #2  Pt to identify food quantities necessary to achieve weight loss of 6-24 lb at graduation from cardiac rehab.     Personal Goal #3  Pt to build a healthy plate including vegetables, fruits, whole grains, and low-fat dairy products in a heart healthy meal plan.    Personal Goal #4  Pt able to name foods that affect blood glucose.        Intervention Plan          Intervention  Prescribe, educate and counsel regarding individualized specific dietary modifications aiming towards targeted core components such as weight, hypertension, lipid management, diabetes, heart failure and other comorbidities.    Expected Outcomes  Short Term Goal: Understand basic principles of dietary content, such as calories, fat, sodium, cholesterol and nutrients.;Long Term Goal: Adherence to prescribed nutrition plan.           Nutrition Assessments: Nutrition Assessments - 03/29/18 1116    MEDFICTS Scores          Pre Score  52           Nutrition Goals Re-Evaluation: Nutrition Goals Re-Evaluation    Goals    Row Name 03/29/18 1116   Current Weight  161 lb 13.1 oz (73.4 kg)          Nutrition Goals Re-Evaluation: Nutrition Goals Re-Evaluation    Goals    Row Name 03/29/18 1116   Current Weight  161 lb 13.1 oz (73.4 kg)          Nutrition Goals Discharge (Final Nutrition Goals Re-Evaluation): Nutrition Goals Re-Evaluation - 03/29/18 1116    Goals          Current Weight  161 lb 13.1 oz (73.4 kg)           Psychosocial: Target Goals:  Acknowledge presence or absence of significant depression and/or stress, maximize coping skills, provide positive support system. Participant is able to verbalize types and ability to use techniques and skills needed for reducing stress and depression.  Initial Review & Psychosocial Screening: Initial Psych Review & Screening - 03/29/18 1011    Initial Review          Current issues with  None Identified        Family Dynamics          Good Support System?  Yes   Pt lists his family, friends, and significant other as sources of support.        Barriers          Psychosocial barriers to participate in program  There are no identifiable barriers or psychosocial needs.        Screening Interventions          Interventions  Encouraged to exercise           Quality of Life Scores: Quality of Life - 03/29/18 1014    Quality of Life          Select  Quality of Life        Quality of Life Scores          Health/Function Pre  28.2 %    Socioeconomic Pre  24.92 %    Psych/Spiritual Pre  27.43 %    Family Pre  27.6 %    GLOBAL Pre  27.2 %  Scores of 19 and below usually indicate a poorer quality of life in these areas.  A difference of  2-3 points is a clinically meaningful difference.  A difference of 2-3 points in the total score of the Quality of Life Index has been associated with significant improvement in overall quality of life, self-image, physical symptoms, and general health in studies assessing change in quality of life.  PHQ-9: Recent Review Flowsheet Data    Depression screen [facility] 2/9 11/14/2017 11/14/2017 11/14/2017 04/11/2017 06/30/2016   Decreased Interest 0 0 0 0 0   Down, Depressed, Hopeless 0 0 0 0 0   PHQ - 2 Score 0 0 0 0 0     Interpretation of Total Score  Total Score Depression Severity:  1-4 = Minimal depression, 5-9 = Mild depression, 10-14 = Moderate depression, 15-19 = Moderately severe depression, 20-27 = Severe depression    Psychosocial Evaluation and Intervention:   Psychosocial Re-Evaluation:   Psychosocial Discharge (Final Psychosocial Re-Evaluation):   Vocational Rehabilitation: Provide vocational rehab assistance to qualifying candidates.   Vocational Rehab Evaluation & Intervention: Vocational Rehab - 03/29/18 1015    Initial Vocational Rehab Evaluation & Intervention          Assessment shows need for Vocational Rehabilitation  No           Education: Education Goals: Education classes will be provided on a weekly basis, covering required topics. Participant will state understanding/return demonstration of topics presented.  Learning Barriers/Preferences: Learning Barriers/Preferences - 03/29/18 1049    Learning Barriers/Preferences          Learning Barriers  None    Learning Preferences  Skilled Demonstration;Individual Instruction;Written Material           Education Topics: Count Your Pulse:  -Group instruction provided by verbal instruction, demonstration, patient participation and written materials to support subject.  Instructors address importance of being able to find your pulse and how to count your pulse when at home without a heart monitor.  Patients get hands on experience counting their pulse with staff help and individually.   Heart Attack, Angina, and Risk Factor Modification:  -Group instruction provided by verbal instruction, video, and written materials to support subject.  Instructors address signs and symptoms of angina and heart attacks.    Also discuss risk factors for heart disease and how to make changes to improve heart health risk factors.   Functional Fitness:  -Group instruction provided by verbal instruction, demonstration, patient participation, and written materials to support subject.  Instructors address safety measures for doing things around the house.  Discuss how to get up and down off the floor, how to pick things up properly, how to safely  get out of a chair without assistance, and balance training.   Meditation and Mindfulness:  -Group instruction provided by verbal instruction, patient participation, and written materials to support subject.  Instructor addresses importance of mindfulness and meditation practice to help reduce stress and improve awareness.  Instructor also leads participants through a meditation exercise.    Stretching for Flexibility and Mobility:  -Group instruction provided by verbal instruction, patient participation, and written materials to support subject.  Instructors lead participants through series of stretches that are designed to increase flexibility thus improving mobility.  These stretches are additional exercise for major muscle groups that are typically performed during regular warm up and cool down.   Hands Only CPR:  -Group verbal, video, and participation provides a basic overview of AHA guidelines for community CPR.  Role-play of emergencies allow participants the opportunity to practice calling for help and chest compression technique with discussion of AED use.   Hypertension: -Group verbal and written instruction that provides a basic overview of hypertension including the most recent diagnostic guidelines, risk factor reduction with self-care instructions and medication management.    Nutrition I class: Heart Healthy Eating:  -Group instruction provided by PowerPoint slides, verbal discussion, and written materials to support subject matter. The instructor gives an explanation and review of the Therapeutic Lifestyle Changes diet recommendations, which includes a discussion on lipid goals, dietary fat, sodium, fiber, plant stanol/sterol esters, sugar, and the components of a well-balanced, healthy diet.   Nutrition II class: Lifestyle Skills:  -Group instruction provided by PowerPoint slides, verbal discussion, and written materials to support subject matter. The instructor gives an  explanation and review of label reading, grocery shopping for heart health, heart healthy recipe modifications, and ways to make healthier choices when eating out.   Diabetes Question & Answer:  -Group instruction provided by PowerPoint slides, verbal discussion, and written materials to support subject matter. The instructor gives an explanation and review of diabetes co-morbidities, pre- and post-prandial blood glucose goals, pre-exercise blood glucose goals, signs, symptoms, and treatment of hypoglycemia and hyperglycemia, and foot care basics.   Diabetes Blitz:  -Group instruction provided by PowerPoint slides, verbal discussion, and written materials to support subject matter. The instructor gives an explanation and review of the physiology behind type 1 and type 2 diabetes, diabetes medications and rational behind using different medications, pre- and post-prandial blood glucose recommendations and Hemoglobin A1c goals, diabetes diet, and exercise including blood glucose guidelines for exercising safely.    Portion Distortion:  -Group instruction provided by PowerPoint slides, verbal discussion, written materials, and food models to support subject matter. The instructor gives an explanation of serving size versus portion size, changes in portions sizes over the last 20 years, and what consists of a serving from each food group.   Stress Management:  -Group instruction provided by verbal instruction, video, and written materials to support subject matter.  Instructors review role of stress in heart disease and how to cope with stress positively.     Exercising on Your Own:  -Group instruction provided by verbal instruction, power point, and written materials to support subject.  Instructors discuss benefits of exercise, components of exercise, frequency and intensity of exercise, and end points for exercise.  Also discuss use of nitroglycerin and activating EMS.  Review options of places to  exercise outside of rehab.  Review guidelines for sex with heart disease.   Cardiac Drugs I:  -Group instruction provided by verbal instruction and written materials to support subject.  Instructor reviews cardiac drug classes: antiplatelets, anticoagulants, beta blockers, and statins.  Instructor discusses reasons, side effects, and lifestyle considerations for each drug class.   Cardiac Drugs II:  -Group instruction provided by verbal instruction and written materials to support subject.  Instructor reviews cardiac drug classes: angiotensin converting enzyme inhibitors (ACE-I), angiotensin II receptor blockers (ARBs), nitrates, and calcium channel blockers.  Instructor discusses reasons, side effects, and lifestyle considerations for each drug class.   Anatomy and Physiology of the Circulatory System:  Group verbal and written instruction and models provide basic cardiac anatomy and physiology, with the coronary electrical and arterial systems. Review of: AMI, Angina, Valve disease, Heart Failure, Peripheral Artery Disease, Cardiac Arrhythmia, Pacemakers, and the ICD.   Other Education:  -Group or individual verbal, written, or video instructions that support  the educational goals of the cardiac rehab program.   Holiday Eating Survival Tips:  -Group instruction provided by PowerPoint slides, verbal discussion, and written materials to support subject matter. The instructor gives patients tips, tricks, and techniques to help them not only survive but enjoy the holidays despite the onslaught of food that accompanies the holidays.   Knowledge Questionnaire Score: Knowledge Questionnaire Score - 03/29/18 1015    Knowledge Questionnaire Score          Pre Score  21/24           Core Components/Risk Factors/Patient Goals at Admission: Personal Goals and Risk Factors at Admission - 03/29/18 1050    Core Components/Risk Factors/Patient Goals on Admission           Weight Management   Yes;Weight Maintenance    Intervention  Weight Management: Develop a combined nutrition and exercise program designed to reach desired caloric intake, while maintaining appropriate intake of nutrient and fiber, sodium and fats, and appropriate energy expenditure required for the weight goal.;Weight Management: Provide education and appropriate resources to help participant work on and attain dietary goals.;Weight Management/Obesity: Establish reasonable short term and long term weight goals.    Admit Weight  161 lb 13.1 oz (73.4 kg)    Expected Outcomes  Short Term: Continue to assess and modify interventions until short term weight is achieved;Long Term: Adherence to nutrition and physical activity/exercise program aimed toward attainment of established weight goal;Weight Maintenance: Understanding of the daily nutrition guidelines, which includes 25-35% calories from fat, 7% or less cal from saturated fats, less than 200mg  cholesterol, less than 1.5gm of sodium, & 5 or more servings of fruits and vegetables daily;Understanding recommendations for meals to include 15-35% energy as protein, 25-35% energy from fat, 35-60% energy from carbohydrates, less than 200mg  of dietary cholesterol, 20-35 gm of total fiber daily;Understanding of distribution of calorie intake throughout the day with the consumption of 4-5 meals/snacks    Hypertension  Yes    Intervention  Provide education on lifestyle modifcations including regular physical activity/exercise, weight management, moderate sodium restriction and increased consumption of fresh fruit, vegetables, and low fat dairy, alcohol moderation, and smoking cessation.;Monitor prescription use compliance.    Expected Outcomes  Short Term: Continued assessment and intervention until BP is < 140/61mm HG in hypertensive participants. < 130/45mm HG in hypertensive participants with diabetes, heart failure or chronic kidney disease.;Long Term: Maintenance of blood pressure at  goal levels.    Lipids  Yes    Intervention  Provide education and support for participant on nutrition & aerobic/resistive exercise along with prescribed medications to achieve LDL 70mg , HDL >40mg .    Expected Outcomes  Short Term: Participant states understanding of desired cholesterol values and is compliant with medications prescribed. Participant is following exercise prescription and nutrition guidelines.;Long Term: Cholesterol controlled with medications as prescribed, with individualized exercise RX and with personalized nutrition plan. Value goals: LDL < 70mg , HDL > 40 mg.           Core Components/Risk Factors/Patient Goals Review:    Core Components/Risk Factors/Patient Goals at Discharge (Final Review):    ITP Comments: ITP Comments    Row Name 03/29/18 1009 04/10/18 1043   ITP Comments  Dr. Fransico Him, Medical Director  30 day ITP review. pt currently on hold for precautionary COVID 19 departmental closings.       Comments:  Andi Hence, RN, BSN Cardiac Pulmonary Rehab

## 2018-04-11 ENCOUNTER — Ambulatory Visit (HOSPITAL_COMMUNITY): Payer: Medicare Other

## 2018-04-13 ENCOUNTER — Ambulatory Visit (HOSPITAL_COMMUNITY): Payer: Medicare Other

## 2018-04-16 ENCOUNTER — Ambulatory Visit (HOSPITAL_COMMUNITY): Payer: Medicare Other

## 2018-04-18 ENCOUNTER — Ambulatory Visit (HOSPITAL_COMMUNITY): Payer: Medicare Other

## 2018-04-20 ENCOUNTER — Ambulatory Visit (HOSPITAL_COMMUNITY): Payer: Medicare Other

## 2018-04-23 ENCOUNTER — Ambulatory Visit (HOSPITAL_COMMUNITY): Payer: Medicare Other

## 2018-04-25 ENCOUNTER — Ambulatory Visit (HOSPITAL_COMMUNITY): Payer: Medicare Other

## 2018-04-25 ENCOUNTER — Telehealth (HOSPITAL_COMMUNITY): Payer: Self-pay | Admitting: Cardiac Rehabilitation

## 2018-04-25 NOTE — Telephone Encounter (Signed)
Pt phone call to inform of continued Outpatient Cardiac Rehab departmental closure for COVID 19 precautions. Future opening date to be determined.  Left message for pt to return call to discuss home exercise guidelines.  Andi Hence, RN, BSN Cardiac Pulmonary Rehab

## 2018-04-27 ENCOUNTER — Ambulatory Visit (HOSPITAL_COMMUNITY): Payer: Medicare Other

## 2018-04-30 ENCOUNTER — Ambulatory Visit (HOSPITAL_COMMUNITY): Payer: Medicare Other

## 2018-05-02 ENCOUNTER — Ambulatory Visit (HOSPITAL_COMMUNITY): Payer: Medicare Other

## 2018-05-03 ENCOUNTER — Other Ambulatory Visit: Payer: Self-pay | Admitting: Physician Assistant

## 2018-05-04 ENCOUNTER — Ambulatory Visit (HOSPITAL_COMMUNITY): Payer: Medicare Other

## 2018-05-07 ENCOUNTER — Ambulatory Visit (HOSPITAL_COMMUNITY): Payer: Medicare Other

## 2018-05-08 ENCOUNTER — Telehealth: Payer: Self-pay | Admitting: Cardiovascular Disease

## 2018-05-08 NOTE — Telephone Encounter (Signed)
   Primary Cardiologist:  Candee Furbish, MD   Patient contacted.  History reviewed.   Called patient.   He has no cardiac symptoms Discussed his weight restrictions Following CABG    He is doing great,  Gaining strength   Priority level 3     No symptoms to suggest any unstable cardiac conditions.  Based on discussion, with current pandemic situation, we will be postponing this appointment for Georgie Chard with a plan for f/u in 12  wks or sooner if feasible/necessary.  If symptoms change, he has been instructed to contact our office.   Routing to C19 CANCEL pool for tracking (P CV DIV CV19 CANCEL - reason for visit "other.") and assigning priority  3 = >12 wks).   Mertie Moores, MD  05/08/2018 3:07 PM         .

## 2018-05-09 ENCOUNTER — Ambulatory Visit (HOSPITAL_COMMUNITY): Payer: Medicare Other

## 2018-05-11 ENCOUNTER — Ambulatory Visit (HOSPITAL_COMMUNITY): Payer: Medicare Other

## 2018-05-14 ENCOUNTER — Ambulatory Visit (HOSPITAL_COMMUNITY): Payer: Medicare Other

## 2018-05-15 ENCOUNTER — Other Ambulatory Visit (HOSPITAL_COMMUNITY): Payer: Medicare Other

## 2018-05-16 ENCOUNTER — Ambulatory Visit (HOSPITAL_COMMUNITY): Payer: Medicare Other

## 2018-05-18 ENCOUNTER — Ambulatory Visit (HOSPITAL_COMMUNITY): Payer: Medicare Other

## 2018-05-21 ENCOUNTER — Ambulatory Visit (HOSPITAL_COMMUNITY): Payer: Medicare Other

## 2018-05-23 ENCOUNTER — Ambulatory Visit (HOSPITAL_COMMUNITY): Payer: Medicare Other

## 2018-05-24 ENCOUNTER — Telehealth: Payer: Self-pay | Admitting: Cardiovascular Disease

## 2018-05-24 NOTE — Telephone Encounter (Signed)
F/U Message            Patient is returning someone's call, it was not documented, not sure who call.

## 2018-05-24 NOTE — Telephone Encounter (Signed)
Called pt in response to his My Chart message on 05/24/18 re: his 06/05/18 appt...   LMTCB.... needs to r/s his visit.. can do televisit with [facility] 06/12/18 or sooner with Dr. Marlou Porch.

## 2018-05-24 NOTE — Telephone Encounter (Signed)
Pt is due to have Echo and 3 month ROV with Truitt Merle NP per Dr. Marlou Porch last [place] 03/06/18...   Called pt re: his My Chart message and he was asking if possible to move up his Echo which was originally 03/2018 and moved to 07/23/18...to be made sooner.Alex Martin  He needs to also reschedule his visit with Tera Helper on 06/05/18..  Will forward to [facility] to see if we can now move up his Echo sooner and then Reschedule the 06/05/18 appt with Truitt Merle NP for just after the Echo (can be with either Cecille Rubin or Dr. Marlou Porch)   06/05/18 is needing to be moved anyway's due to schedule change.   Pt consented on 05/24/18 for Video visit when the appt is scheduled.         Virtual Visit Pre-Appointment Phone Call  1. "What is the BEST phone number to call the day of the visit?" - include this in appointment notes  2. Do you have or have access to (through a family member/friend) a smartphone with video capability that we can use for your visit?  YES  3. Confirm consent - "In the setting of the current Covid19 crisis, you are scheduled for a (phone or video) visit with your provider on (date) at (time).  Just as we do with many in-office visits, in order for you to participate in this visit, we must obtain consent.  If you'd like, I can send this to your mychart (if signed up) or email for you to review.  Otherwise, I can obtain your verbal consent now.  All virtual visits are billed to your insurance company just like a normal visit would be.  By agreeing to a virtual visit, we'd like you to understand that the technology does not allow for your provider to perform an examination, and thus may limit your provider's ability to fully assess your condition. If your provider identifies any concerns that need to be evaluated in person, we will make arrangements to do so.  Finally, though the technology is pretty good, we cannot assure that it will always work on either your or our end, and in the setting of a video visit, we  may have to convert it to a phone-only visit.  In either situation, we cannot ensure that we have a secure connection.  Are you willing to proceed?" STAFF: Did the patient verbally acknowledge consent to telehealth visit? Document YES/NO here: YES  4. Advise patient to be prepared - "Two hours prior to your appointment, go ahead and check your blood pressure, pulse, oxygen saturation, and your weight (if you have the equipment to check those) and write them all down. When your visit starts, your provider will ask you for this information. If you have an Apple Watch or Kardia device, please plan to have heart rate information ready on the day of your appointment. Please have a pen and paper handy nearby the day of the visit as well."  5. Give patient instructions for MyChart download to smartphone OR Doximity/Doxy.me as below if video visit (depending on what platform provider is using)  6. Inform patient they will receive a phone call 15 minutes prior to their appointment time (may be from unknown caller ID) so they should be prepared to answer    TELEPHONE CALL NOTE  DIONTAE ROUTE has been deemed a candidate for a follow-up tele-health visit to limit community exposure during the Covid-19 pandemic. I spoke with the patient via phone to  ensure availability of phone/video source, confirm preferred email & phone number, and discuss instructions and expectations.  I reminded MAGIC MOHLER to be prepared with any vital sign and/or heart rhythm information that could potentially be obtained via home monitoring, at the time of his visit. I reminded WITTEN CERTAIN to expect a phone call prior to his visit.  Stephani Police, RN 05/24/2018 3:50 PM   IF USING DOXIMITY or DOXY.ME - The patient will receive a link just prior to their visit by text.     FULL LENGTH CONSENT FOR TELE-HEALTH VISIT   I hereby voluntarily request, consent and authorize [facility] and its employed or contracted  physicians, physician assistants, nurse practitioners or other licensed health care professionals (the Practitioner), to provide me with telemedicine health care services (the Services") as deemed necessary by the treating Practitioner. I acknowledge and consent to receive the Services by the Practitioner via telemedicine. I understand that the telemedicine visit will involve communicating with the Practitioner through live audiovisual communication technology and the disclosure of certain medical information by electronic transmission. I acknowledge that I have been given the opportunity to request an in-person assessment or other available alternative prior to the telemedicine visit and am voluntarily participating in the telemedicine visit.  I understand that I have the right to withhold or withdraw my consent to the use of telemedicine in the course of my care at any time, without affecting my right to future care or treatment, and that the Practitioner or I may terminate the telemedicine visit at any time. I understand that I have the right to inspect all information obtained and/or recorded in the course of the telemedicine visit and may receive copies of available information for a reasonable fee.  I understand that some of the potential risks of receiving the Services via telemedicine include:   Delay or interruption in medical evaluation due to technological equipment failure or disruption;  Information transmitted may not be sufficient (e.g. poor resolution of images) to allow for appropriate medical decision making by the Practitioner; and/or   In rare instances, security protocols could fail, causing a breach of personal health information.  Furthermore, I acknowledge that it is my responsibility to provide information about my medical history, conditions and care that is complete and accurate to the best of my ability. I acknowledge that Practitioner's advice, recommendations, and/or decision  may be based on factors not within their control, such as incomplete or inaccurate data provided by me or distortions of diagnostic images or specimens that may result from electronic transmissions. I understand that the practice of medicine is not an exact science and that Practitioner makes no warranties or guarantees regarding treatment outcomes. I acknowledge that I will receive a copy of this consent concurrently upon execution via email to the email address I last provided but may also request a printed copy by calling the office of [facility].    I understand that my insurance will be billed for this visit.   I have read or had this consent read to me.  I understand the contents of this consent, which adequately explains the benefits and risks of the Services being provided via telemedicine.   I have been provided ample opportunity to ask questions regarding this consent and the Services and have had my questions answered to my satisfaction.  I give my informed consent for the services to be provided through the use of telemedicine in my medical care  By participating in  this telemedicine visit I agree to the above.

## 2018-05-25 ENCOUNTER — Ambulatory Visit (HOSPITAL_COMMUNITY): Payer: Medicare Other

## 2018-05-28 ENCOUNTER — Ambulatory Visit (HOSPITAL_COMMUNITY): Payer: Medicare Other

## 2018-05-28 NOTE — Telephone Encounter (Signed)
The echo appointment is for 5/27  I can see him earlier the first week of June if he would like.   Burtis Junes, RN, [facility] [address] [place], [place]  [id number] [phone]

## 2018-05-28 NOTE — Telephone Encounter (Signed)
Routing to Truitt Merle, NP and Andee Poles, CMA to determine if appointments are adequate

## 2018-05-28 NOTE — Telephone Encounter (Signed)
lvm to let pt know can move appt with Truitt Merle, NP to the first week of June with a 45 min slot starting at 8:00 am after pt's echo.

## 2018-05-29 NOTE — Telephone Encounter (Signed)
Reading chart pt cancelled Kathrene Alu and has appointment with Dr. Marlou Porch.

## 2018-05-30 ENCOUNTER — Ambulatory Visit (HOSPITAL_COMMUNITY): Payer: Medicare Other

## 2018-06-01 ENCOUNTER — Ambulatory Visit (HOSPITAL_COMMUNITY): Payer: Medicare Other

## 2018-06-04 ENCOUNTER — Ambulatory Visit (HOSPITAL_COMMUNITY): Payer: Medicare Other

## 2018-06-05 ENCOUNTER — Ambulatory Visit: Payer: Medicare Other | Admitting: Nurse Practitioner

## 2018-06-06 ENCOUNTER — Ambulatory Visit (HOSPITAL_COMMUNITY): Payer: Medicare Other

## 2018-06-08 ENCOUNTER — Ambulatory Visit (HOSPITAL_COMMUNITY): Payer: Medicare Other

## 2018-06-12 ENCOUNTER — Telehealth (HOSPITAL_COMMUNITY): Payer: Self-pay

## 2018-06-12 ENCOUNTER — Telehealth (HOSPITAL_COMMUNITY): Payer: Self-pay | Admitting: Radiology

## 2018-06-12 NOTE — Telephone Encounter (Signed)

## 2018-06-12 NOTE — Telephone Encounter (Signed)
Phone call made to Pt to provide information about virtual cardiac rehab. Pt did not answer. Message was left for pt to return call.

## 2018-06-13 ENCOUNTER — Encounter (HOSPITAL_COMMUNITY): Payer: Self-pay

## 2018-06-13 ENCOUNTER — Ambulatory Visit (HOSPITAL_COMMUNITY): Payer: Medicare Other

## 2018-06-13 ENCOUNTER — Other Ambulatory Visit: Payer: Self-pay

## 2018-06-13 ENCOUNTER — Ambulatory Visit (HOSPITAL_COMMUNITY): Payer: Medicare Other | Attending: Internal Medicine

## 2018-06-13 DIAGNOSIS — Z951 Presence of aortocoronary bypass graft: Secondary | ICD-10-CM | POA: Diagnosis present

## 2018-06-13 DIAGNOSIS — I10 Essential (primary) hypertension: Secondary | ICD-10-CM | POA: Insufficient documentation

## 2018-06-13 DIAGNOSIS — R600 Localized edema: Secondary | ICD-10-CM | POA: Diagnosis present

## 2018-06-13 NOTE — Progress Notes (Signed)
Transitioning to virtual cardiac rehab  Dr.  Marlou Porch   As you are aware our department remains closed to patients due to Covid-19.  We are excited to be able to offer an alternative to traditional onsite Cardiac Rehab while your patient continues to follow Re-Open guidelines.  This is a notification that your patient has been contacted and is very interested in participating in Virtual Cardiac Rehab.  Thank you for your continued support in helping [place] meet the health care needs of our patients.  Tedra Senegal. Support Rep II   Cardiac Rehab staff

## 2018-06-15 ENCOUNTER — Ambulatory Visit (HOSPITAL_COMMUNITY): Payer: Medicare Other

## 2018-06-15 ENCOUNTER — Telehealth: Payer: Self-pay

## 2018-06-15 NOTE — Telephone Encounter (Signed)
YOUR CARDIOLOGY TEAM HAS ARRANGED FOR AN E-VISIT FOR YOUR APPOINTMENT - PLEASE REVIEW IMPORTANT INFORMATION BELOW SEVERAL DAYS PRIOR TO YOUR APPOINTMENT  Due to the recent COVID-19 pandemic, we are transitioning in-person office visits to tele-medicine visits in an effort to decrease unnecessary exposure to our patients, their families, and staff. These visits are billed to your insurance just like a normal visit is. We also encourage you to sign up for MyChart if you have not already done so. You will need a smartphone if possible. For patients that do not have this, we can still complete the visit using a regular telephone but do prefer a smartphone to enable video when possible. You may have a family member that lives with you that can help. If possible, we also ask that you have a blood pressure cuff and scale at home to measure your blood pressure, heart rate and weight prior to your scheduled appointment. Patients with clinical needs that need an in-person evaluation and testing will still be able to come to the office if absolutely necessary. If you have any questions, feel free to call our office.     YOUR PROVIDER WILL BE USING THE FOLLOWING PLATFORM TO COMPLETE YOUR VISIT: Doxy.Me  . IF USING MYCHART - How to Download the MyChart App to Your SmartPhone   - If Apple, go to App Store and type in MyChart in the search bar and download the app. If Android, ask patient to go to Google Play Store and type in MyChart in the search bar and download the app. The app is free but as with any other app downloads, your phone may require you to verify saved payment information or Apple/Android password.  - You will need to then log into the app with your MyChart username and password, and select [facility] as your healthcare provider to link the account.  - When it is time for your visit, go to the MyChart app, find appointments, and click Begin Video Visit. Be sure to Select Allow for your device to  access the Microphone and Camera for your visit. You will then be connected, and your provider will be with you shortly.  **If you have any issues connecting or need assistance, please contact MyChart service desk [phone] ([phone])**  **If using a computer, in order to ensure the best quality for your visit, you will need to use either of the following Internet Browsers: Google Chrome or Microsoft Edge**  . IF USING DOXIMITY or DOXY.ME - The staff will give you instructions on receiving your link to join the meeting the day of your visit.      2-3 DAYS BEFORE YOUR APPOINTMENT  You will receive a telephone call from one of our HeartCare team members - your caller ID may say "Unknown caller." If this is a video visit, we will walk you through how to get the video launched on your phone. We will remind you check your blood pressure, heart rate and weight prior to your scheduled appointment. If you have an Apple Watch or Kardia, please upload any pertinent ECG strips the day before or morning of your appointment to MyChart. Our staff will also make sure you have reviewed the consent and agree to move forward with your scheduled tele-health visit.     THE DAY OF YOUR APPOINTMENT  Approximately 15 minutes prior to your scheduled appointment, you will receive a telephone call from one of HeartCare team - your caller ID may say "Unknown caller."    Our staff will confirm medications, vital signs for the day and any symptoms you may be experiencing. Please have this information available prior to the time of visit start. It may also be helpful for you to have a pad of paper and pen handy for any instructions given during your visit. They will also walk you through joining the smartphone meeting if this is a video visit.    CONSENT FOR TELE-HEALTH VISIT - PLEASE REVIEW  I hereby voluntarily request, consent and authorize [facility] and its employed or contracted physicians, physician  assistants, nurse practitioners or other licensed health care professionals (the Practitioner), to provide me with telemedicine health care services (the "Services") as deemed necessary by the treating Practitioner. I acknowledge and consent to receive the Services by the Practitioner via telemedicine. I understand that the telemedicine visit will involve communicating with the Practitioner through live audiovisual communication technology and the disclosure of certain medical information by electronic transmission. I acknowledge that I have been given the opportunity to request an in-person assessment or other available alternative prior to the telemedicine visit and am voluntarily participating in the telemedicine visit.  I understand that I have the right to withhold or withdraw my consent to the use of telemedicine in the course of my care at any time, without affecting my right to future care or treatment, and that the Practitioner or I may terminate the telemedicine visit at any time. I understand that I have the right to inspect all information obtained and/or recorded in the course of the telemedicine visit and may receive copies of available information for a reasonable fee.  I understand that some of the potential risks of receiving the Services via telemedicine include:  . Delay or interruption in medical evaluation due to technological equipment failure or disruption; . Information transmitted may not be sufficient (e.g. poor resolution of images) to allow for appropriate medical decision making by the Practitioner; and/or  . In rare instances, security protocols could fail, causing a breach of personal health information.  Furthermore, I acknowledge that it is my responsibility to provide information about my medical history, conditions and care that is complete and accurate to the best of my ability. I acknowledge that Practitioner's advice, recommendations, and/or decision may be based on  factors not within their control, such as incomplete or inaccurate data provided by me or distortions of diagnostic images or specimens that may result from electronic transmissions. I understand that the practice of medicine is not an exact science and that Practitioner makes no warranties or guarantees regarding treatment outcomes. I acknowledge that I will receive a copy of this consent concurrently upon execution via email to the email address I last provided but may also request a printed copy by calling the office of [facility].    I understand that my insurance will be billed for this visit.   I have read or had this consent read to me. . I understand the contents of this consent, which adequately explains the benefits and risks of the Services being provided via telemedicine.  . I have been provided ample opportunity to ask questions regarding this consent and the Services and have had my questions answered to my satisfaction. . I give my informed consent for the services to be provided through the use of telemedicine in my medical care  By participating in this telemedicine visit I agree to the above.  

## 2018-06-18 ENCOUNTER — Encounter (HOSPITAL_COMMUNITY)
Admission: RE | Admit: 2018-06-18 | Discharge: 2018-06-18 | Disposition: A | Discharge status: Home / Self Care | Payer: Medicare Other | Source: Ambulatory Visit | Attending: Cardiology | Admitting: Cardiology

## 2018-06-18 ENCOUNTER — Ambulatory Visit (HOSPITAL_COMMUNITY): Payer: Medicare Other

## 2018-06-18 ENCOUNTER — Encounter (HOSPITAL_COMMUNITY): Payer: Self-pay | Admitting: *Deleted

## 2018-06-18 ENCOUNTER — Telehealth (HOSPITAL_COMMUNITY): Payer: Self-pay

## 2018-06-18 DIAGNOSIS — Z951 Presence of aortocoronary bypass graft: Secondary | ICD-10-CM

## 2018-06-18 DIAGNOSIS — I214 Non-ST elevation (NSTEMI) myocardial infarction: Secondary | ICD-10-CM | POA: Insufficient documentation

## 2018-06-18 NOTE — Telephone Encounter (Signed)
° °        Confirm Consent - In the setting of the current Covid19 crisis, you are scheduled for a phone visit with your Cardiac or Pulmonary team member.  Just as we do with many in-gym visits, in order for you to participate in this visit, we must obtain consent.  If you'd like, I can send this to your mychart (if signed up) or email for you to review.  Otherwise, I can obtain your verbal consent now.  By agreeing to a telephone visit, we'd like you to understand that the technology does not allow for your Cardiac or Pulmonary Rehab team member to perform a physical assessment, and thus may limit their ability to fully assess your ability to perform exercise programs. If your provider identifies any concerns that need to be evaluated in person, we will make arrangements to do so.  Finally, though the technology is pretty good, we cannot assure that it will always work on either your or our end and we cannot ensure that we have a secure connection.  Cardiac and Pulmonary Rehab Telehealth visits and At Home cardiac and pulmonary rehab are provided at no cost to you.               Are you willing to proceed?" STAFF: Did the patient verbally acknowledge consent to telehealth visit? Document YES/NO here: Yes      Jessica C.   Cardiac and Pulmonary Rehab Staff   D6/01/2018 T1:51PM

## 2018-06-18 NOTE — Progress Notes (Signed)
Called and spoke to pt regarding Virtual Cardiac Rehab.  Pt  was able to download the [facility] app on their smart device with no issues. Pt set up their account and received the following welcome message -"Welcome to the [facility] and Pulmonary Rehabilitation program. We hope that you will find the exercise program beneficial in your recovery process. Our staff is available to assist with in questions/concerns about your exercise routine. Best wishes". Brief orientation provided to with the advisement to watch the "Intro to Rehab" series located under the Resource tab. Pt verbalized understanding. Will continue to follow and monitor pt progress with feedback as needed.Barnet Pall, RN,BSN 06/18/2018 2:24 PM

## 2018-06-19 ENCOUNTER — Telehealth (INDEPENDENT_AMBULATORY_CARE_PROVIDER_SITE_OTHER): Payer: Medicare Other | Admitting: Cardiology

## 2018-06-19 ENCOUNTER — Telehealth (HOSPITAL_COMMUNITY): Payer: Self-pay | Admitting: *Deleted

## 2018-06-19 ENCOUNTER — Encounter: Payer: Self-pay | Admitting: Cardiology

## 2018-06-19 ENCOUNTER — Other Ambulatory Visit: Payer: Self-pay

## 2018-06-19 VITALS — BP 133/71 | HR 62 | Ht 66.5 in | Wt 164.0 lb

## 2018-06-19 DIAGNOSIS — E785 Hyperlipidemia, unspecified: Secondary | ICD-10-CM

## 2018-06-19 DIAGNOSIS — Z951 Presence of aortocoronary bypass graft: Secondary | ICD-10-CM

## 2018-06-19 DIAGNOSIS — Z79899 Other long term (current) drug therapy: Secondary | ICD-10-CM

## 2018-06-19 DIAGNOSIS — I10 Essential (primary) hypertension: Secondary | ICD-10-CM

## 2018-06-19 DIAGNOSIS — I2510 Atherosclerotic heart disease of native coronary artery without angina pectoris: Secondary | ICD-10-CM | POA: Diagnosis not present

## 2018-06-19 NOTE — Patient Instructions (Signed)
Medication Instructions:  The current medical regimen is effective;  continue present plan and medications.  If you need a refill on your cardiac medications before your next appointment, please call your pharmacy.   Lab work: Please come into the office for fasting blood work (CBC, CMP and Lipid)  If you have labs (blood work) drawn today and your tests are completely normal, you will receive your results only by: Marland Kitchen MyChart Message (if you have MyChart) OR . A paper copy in the mail If you have any lab test that is abnormal or we need to change your treatment, we will call you to review the results.  Follow-Up: At [facility], you and your health needs are our priority.  As part of our continuing mission to provide you with exceptional heart care, we have created designated Provider Care Teams.  These Care Teams include your primary Cardiologist (physician) and Advanced Practice Providers (APPs -  Physician Assistants and Nurse Practitioners) who all work together to provide you with the care you need, when you need it. You will need a follow up appointment in 6 months.  Please call our office 2 months in advance to schedule this appointment.  You may see Candee Furbish, MD or one of the following Advanced Practice Providers on your designated Care Team:   Truitt Merle, NP Cecilie Kicks, NP . Kathyrn Drown, NP  Thank you for choosing [facility]!!

## 2018-06-19 NOTE — Progress Notes (Signed)
Virtual Visit via Video Note   This visit type was conducted due to national recommendations for restrictions regarding the COVID-19 Pandemic (e.g. social distancing) in an effort to limit this patient's exposure and mitigate transmission in our community.  Due to his co-morbid illnesses, this patient is at least at moderate risk for complications without adequate follow up.  This format is felt to be most appropriate for this patient at this time.  All issues noted in this document were discussed and addressed.  A limited physical exam was performed with this format.  Please refer to the patient's chart for his consent to telehealth for [facility].   Date:  06/19/2018   ID:  Alex Martin, DOB [birth year], MRN [id number]  Patient Location: Home Provider Location: Home  PCP:  Susy Frizzle, MD  Cardiologist:  Candee Furbish, MD  Electrophysiologist:  None   Evaluation Performed:  Follow-Up Visit  Chief Complaint:  CAD  History of Present Illness:    Alex Martin is a 78 y.o. male with follow-up of coronary artery disease status post CABG 02/05/2018.  I admitted him to the hospital with chest discomfort and elevated troponin consistent with non-ST elevation myocardial infarction.  Originally woke up from sleep at 230 with substernal chest discomfort radiating to his back and bilateral arms it was very intense.  ST depressions were noted in V5 and V6.  Original troponin was elevated at 2.28.  He then went forward with cardiac catheterization which revealed triple-vessel disease.  Dr. Dema Severin was consulted and performed CABG on 02/05/2018 x4 LIMA to LAD free RIMA to OM, sequential SVG to PDA and PL.  He was discharged on 02/10/2018.  He did develop some A. fib with RVR that was treated with IV amiodarone and successfully converted to normal sinus rhythm.  He was started on Plavix for ACS.  P.o. amiodarone was discontinued after having side effects.  There was some suspicion that  some of the side effects could have been alcohol withdrawal.  No further palpitations.  No fevers chills nausea vomiting syncope bleeding.  There was comfort and potentially discontinuing his Lasix during yesterday's visit with cardiothoracic surgery PA.  He did see Truitt Merle who increased furosemide from 40-60 back on 02/21/2018.  Appears to be euvolemic currently.  EF was reduced 30 to 35% with apical lateral akinesis.  This was pre-surgery.  06/19/2018- excellent EF now, echocardiogram shows normal EF 60%.  Continuing with medication regimen.  He is feeling well.  No chest pain fevers chills nausea vomiting syncope bleeding.  He does have some slight numbness at his chest scar.  His left fourth and fifth digit numbness has improved.  The patient does not have symptoms concerning for COVID-19 infection (fever, chills, cough, or new shortness of breath).    Past Medical History:  Diagnosis Date  . Abdominal distension    UMBILICAL HERNIA-NO PAIN  . Bleeding from the nose    SEVERAL EPISODES THAT REQUIRED TX IN ER--LAST EPISODE WAS APPROX 1 YR AGO  . Blood in urine    MICROSCOPIC BLOODEVALUATED BY DR. NESI FRIDAY 02/11/11--URINE SAMPLE AND EXAM-- AND BLOOD SAMPLE AND TO BE SEEN AGAIN IN 6 MONTHS AND GIVEN VESICARE  FOR FREQUENCY AND URGENCY  . Coronary artery disease   . GERD (gastroesophageal reflux disease)   . Hyperlipidemia   . Hypertension    Past Surgical History:  Procedure Laterality Date  . CARDIAC CATHETERIZATION    . CORONARY ARTERY BYPASS GRAFT N/A 02/05/2018  Procedure: CORONARY ARTERY BYPASS GRAFTING (CABG) x4 USING BILATERAL IMA'S WITH LEFT GREATER SAPHENOUS VEIN HARVEST AND EXPLORATION OF RIGHT SAPHENOUS;  Surgeon: Gaye Pollack, MD;  Location: [facility];  Service: Open Heart Surgery;  Laterality: N/A;  . [place]  . LEFT HEART CATH AND CORONARY ANGIOGRAPHY N/A 02/02/2018   Procedure: LEFT HEART CATH AND CORONARY ANGIOGRAPHY;  Surgeon: Martinique, Peter M,  MD;  Location: [facility];  Service: Cardiovascular;  Laterality: N/A;  . TEE WITHOUT CARDIOVERSION N/A 02/05/2018   Procedure: TRANSESOPHAGEAL ECHOCARDIOGRAM (TEE);  Surgeon: Gaye Pollack, MD;  Location: [facility];  Service: Open Heart Surgery;  Laterality: N/A;  . UMBILICAL HERNIA REPAIR  02/15/2011   Procedure: HERNIA REPAIR UMBILICAL ADULT;  Surgeon: Odis Hollingshead, MD;  Location: [facility];  Service: General;  Laterality: N/A;  umbilical hernia repair with mesh  . VASECTOMY  1976     Current Meds  Medication Sig  . aspirin EC 81 MG tablet Take 81 mg by mouth daily.  Marland Kitchen atorvastatin (LIPITOR) 80 MG tablet Take 1 tablet (80 mg total) by mouth daily at 6 PM.  . clopidogrel (PLAVIX) 75 MG tablet Take 1 tablet (75 mg total) by mouth daily.  . Cyanocobalamin (VITAMIN B-12 PO) Take 1 tablet by mouth daily.  Marland Kitchen latanoprost (XALATAN) 0.005 % ophthalmic solution Place 1 drop into both eyes at bedtime.   Marland Kitchen losartan (COZAAR) 50 MG tablet Take 1 tablet (50 mg total) by mouth daily.  . metoprolol succinate (TOPROL-XL) 50 MG 24 hr tablet Take 1 tablet (50 mg total) by mouth daily. Take with or immediately following a meal.  . Multiple Vitamin (MULTIVITAMIN) tablet Take 1 tablet by mouth daily.  Marland Kitchen NEXIUM 40 MG capsule TAKE 1 CAPSULE BY MOUTH EVERY DAY  . solifenacin (VESICARE) 5 MG tablet Take 5 mg by mouth daily.  . timolol (TIMOPTIC) 0.5 % ophthalmic solution Place 1 drop into both eyes daily.     Allergies:   Amlodipine and Shellfish allergy   Social History   Tobacco Use  . Smoking status: Former Smoker    Years: 10.00    Last attempt to quit: 02/22/1992    Years since quitting: 26.3  . Smokeless tobacco: Never Used  . Tobacco comment: QUIT SMOKING ABOUT 1994 - 1 PP WEEK  Substance Use Topics  . Alcohol use: Yes    Comment: 2 GLASSES WINE DAILY  . Drug use: No     Family Hx: The patient's family history includes Cancer in his sister; Heart disease in his father and mother.  ROS:    Please see the history of present illness.     All other systems reviewed and are negative.   Prior CV studies:   The following studies were reviewed today:  Echocardiograms reviewed  Labs/Other Tests and Data Reviewed:    EKG:  An ECG dated 02/21/2018 was personally reviewed today and demonstrated:  Sinus rhythm with T wave inversion laterally  Recent Labs: 11/07/2017: ALT 15 02/06/2018: Magnesium 2.5 02/21/2018: NT-Pro BNP 2,426 03/01/2018: BUN 28; Creatinine, Ser 1.46; Hemoglobin 12.0; Platelets 418; Potassium 5.1; Sodium 138   Recent Lipid Panel Lab Results  Component Value Date/Time   CHOL 194 02/03/2018 05:00 AM   TRIG 127 02/03/2018 05:00 AM   HDL 59 02/03/2018 05:00 AM   CHOLHDL 3.3 02/03/2018 05:00 AM   LDLCALC 110 (H) 02/03/2018 05:00 AM   LDLCALC 97 11/07/2017 10:17 AM    Wt Readings  from Last 3 Encounters:  06/19/18 164 lb (74.4 kg)  03/29/18 161 lb 13.1 oz (73.4 kg)  03/06/18 158 lb (71.7 kg)     Objective:    Vital Signs:  BP 133/71   Pulse 62   Ht 5' 6.5" (1.689 m)   Wt 164 lb (74.4 kg)   BMI 26.07 kg/m    VITAL SIGNS:  reviewed GEN:  no acute distress EYES:  sclerae anicteric, EOMI - Extraocular Movements Intact RESPIRATORY:  normal respiratory effort, symmetric expansion SKIN:  no rash, lesions or ulcers. MUSCULOSKELETAL:  no obvious deformities. NEURO:  alert and oriented x 3, no obvious focal deficit PSYCH:  normal affect  ASSESSMENT & PLAN:    Ischemic cardiomyopathy -EF 30 to 35% pre-bypass.  This was in January 2020.  06/13/2018- echocardiogram repeated showed normal EF.  This is excellent. -Continue with beta-blocker, angiotensin receptor blocker He is euvolemic.  Does not require any diuretic.  Coronary artery disease status post CABG -January 2020.  In the setting of non-ST elevation myocardial infarction.  EF has improved cardiac rehab.  Non-ST elevation myocardial infarction -Continue with aspirin and Plavix.  We will  discontinue the Plavix after 1 year treatment, January 2021  Paroxysmal atrial fibrillation - Brief postop, off of amiodarone now.  Stopped amiodarone because of potential side effects but this was thought to be potentially alcohol withdrawal.  Chronic systolic heart failure-resolved  Hyperlipidemia -On high intensity statin therapy.  LDL goal less than 70.  Repeat lipid panel.  Checking labs  Alcohol use - Continue to promote cessation.  Chronic kidney disease stage III - Avoid NSAIDs.  We will recheck renal function.  Creatinine has been in the 1.6 range previously.  COVID-19 Education: The signs and symptoms of COVID-19 were discussed with the patient and how to seek care for testing (follow up with PCP or arrange E-visit).  The importance of social distancing was discussed today.  Time:   Today, I have spent 18 minutes with the patient with telehealth technology discussing the above problems.     Medication Adjustments/Labs and Tests Ordered: Current medicines are reviewed at length with the patient today.  Concerns regarding medicines are outlined above.   Tests Ordered: Orders Placed This Encounter  Procedures  . CBC  . Comprehensive metabolic panel  . Lipid panel    Medication Changes: No orders of the defined types were placed in this encounter.   Disposition:  Follow up in 6 month(s)  Signed, Candee Furbish, MD  06/19/2018 10:19 AM    [place]

## 2018-06-20 ENCOUNTER — Other Ambulatory Visit: Payer: Medicare Other | Admitting: *Deleted

## 2018-06-20 ENCOUNTER — Other Ambulatory Visit: Payer: Self-pay

## 2018-06-20 ENCOUNTER — Ambulatory Visit (HOSPITAL_COMMUNITY): Payer: Medicare Other

## 2018-06-20 DIAGNOSIS — E785 Hyperlipidemia, unspecified: Secondary | ICD-10-CM

## 2018-06-20 DIAGNOSIS — I10 Essential (primary) hypertension: Secondary | ICD-10-CM

## 2018-06-20 DIAGNOSIS — Z79899 Other long term (current) drug therapy: Secondary | ICD-10-CM

## 2018-06-20 DIAGNOSIS — I2510 Atherosclerotic heart disease of native coronary artery without angina pectoris: Secondary | ICD-10-CM

## 2018-06-20 DIAGNOSIS — Z951 Presence of aortocoronary bypass graft: Secondary | ICD-10-CM

## 2018-06-20 LAB — CBC
Hematocrit: 38.3 % (ref 37.5–51.0)
Hemoglobin: 12.1 g/dL — ABNORMAL LOW (ref 13.0–17.7)
MCH: 26.9 pg (ref 26.6–33.0)
MCHC: 31.6 g/dL (ref 31.5–35.7)
MCV: 85 fL (ref 79–97)
Platelets: 285 10*3/uL (ref 150–450)
RBC: 4.49 x10E6/uL (ref 4.14–5.80)
RDW: 17.3 % — ABNORMAL HIGH (ref 11.6–15.4)
WBC: 4.7 10*3/uL (ref 3.4–10.8)

## 2018-06-20 LAB — LIPID PANEL
Chol/HDL Ratio: 2.6 ratio (ref 0.0–5.0)
Cholesterol, Total: 165 mg/dL (ref 100–199)
HDL: 63 mg/dL (ref >39)
LDL Calculated: 70 mg/dL (ref 0–99)
Triglycerides: 162 mg/dL — ABNORMAL HIGH (ref 0–149)
VLDL Cholesterol Cal: 32 mg/dL (ref 5–40)

## 2018-06-20 LAB — COMPREHENSIVE METABOLIC PANEL
ALT: 25 IU/L (ref 0–44)
AST: 25 IU/L (ref 0–40)
Albumin/Globulin Ratio: 1.6 (ref 1.2–2.2)
Albumin: 4 g/dL (ref 3.7–4.7)
Alkaline Phosphatase: 87 IU/L (ref 39–117)
BUN/Creatinine Ratio: 19 (ref 10–24)
BUN: 17 mg/dL (ref 8–27)
Bilirubin Total: 0.4 mg/dL (ref 0.0–1.2)
CO2: 22 mmol/L (ref 20–29)
Calcium: 9.3 mg/dL (ref 8.6–10.2)
Chloride: 105 mmol/L (ref 96–106)
Creatinine, Ser: 0.9 mg/dL (ref 0.76–1.27)
GFR calc Af Amer: 95 mL/min/{1.73_m2} (ref >59)
GFR calc non Af Amer: 82 mL/min/{1.73_m2} (ref >59)
Globulin, Total: 2.5 g/dL (ref 1.5–4.5)
Glucose: 89 mg/dL (ref 65–99)
Potassium: 4.5 mmol/L (ref 3.5–5.2)
Sodium: 143 mmol/L (ref 134–144)
Total Protein: 6.5 g/dL (ref 6.0–8.5)

## 2018-06-22 ENCOUNTER — Ambulatory Visit (HOSPITAL_COMMUNITY): Payer: Medicare Other

## 2018-06-25 ENCOUNTER — Ambulatory Visit (HOSPITAL_COMMUNITY): Payer: Medicare Other

## 2018-06-25 ENCOUNTER — Telehealth: Payer: Medicare Other | Admitting: Nurse Practitioner

## 2018-06-27 ENCOUNTER — Ambulatory Visit (HOSPITAL_COMMUNITY): Payer: Medicare Other

## 2018-06-29 ENCOUNTER — Ambulatory Visit (HOSPITAL_COMMUNITY): Payer: Medicare Other

## 2018-07-02 ENCOUNTER — Ambulatory Visit (HOSPITAL_COMMUNITY): Payer: Medicare Other

## 2018-07-04 ENCOUNTER — Ambulatory Visit (HOSPITAL_COMMUNITY): Payer: Medicare Other

## 2018-07-06 ENCOUNTER — Ambulatory Visit (HOSPITAL_COMMUNITY): Payer: Medicare Other

## 2018-07-17 ENCOUNTER — Telehealth (HOSPITAL_COMMUNITY): Payer: Self-pay | Admitting: *Deleted

## 2018-07-17 ENCOUNTER — Telehealth: Payer: Self-pay | Admitting: Cardiology

## 2018-07-17 NOTE — Telephone Encounter (Signed)
Spoke with the paitent regarding reports of feeling lightheaded on the virtual cardiac rehab APP. Alex Martin reported that he felt just "a little lightheaded" while riding his bike this morning the symptoms went way quickly. Encouraged the patient to make sure that he is drinking enough fluids and to continue to be careful of the temperature precautions.Blood pressure 141/59 heart rate 56 pre exercise. Post exercise blood pressure 137/52 heart rate 57. Medications reviewed. Patient is taking medications as prescribed. Dr Marlou Porch office notified about the patients reported symptoms. Alex Martin feels fine this afternoon.Barnet Pall, RN,BSN 07/17/2018 4:04 PM

## 2018-07-17 NOTE — Telephone Encounter (Signed)
Pt takes metoprolol 50 mg PO daily as well as uses Timoptic drops.  In review of his chart, pt's HR per documentation has been in the 50s to 60s bmp range for quite sometime even before starting the Metoprolol.  Per note from Barnet Pall, RN pt c/o some fleeting lightheadedness this AM while riding his bike.  BP was 141/59 HR 56 pre exercise and 137/52 HR 57 post exercise. She advised him to make sure he is drinking enough fluids and reviewed temperature precautions. Will forward to Dr Marlou Porch for review.

## 2018-07-17 NOTE — Telephone Encounter (Signed)
  Alex Martin at Cardiac Rehab is calling to let Dr Marlou Porch know that patient is on a beta blocker and a eye drop called timolol (TIMOPTIC) 0.5 % ophthalmic solution. BP was 141/59 HR 56. She is wondering if it is okay for him to take both these medications. Please follow up with the patient per Verdis Frederickson.

## 2018-07-18 NOTE — Telephone Encounter (Signed)
Left message for patient per Dr Marlou Porch - OK to take both medications as listed.   Requested he c/b if any questions or concerns.

## 2018-07-18 NOTE — Telephone Encounter (Signed)
Ok to take both. Thanks Candee Furbish, MD

## 2018-07-23 ENCOUNTER — Other Ambulatory Visit (HOSPITAL_COMMUNITY): Payer: Medicare Other

## 2018-08-27 ENCOUNTER — Encounter (HOSPITAL_COMMUNITY)
Admission: RE | Admit: 2018-08-27 | Discharge: 2018-08-27 | Disposition: A | Discharge status: Home / Self Care | Payer: Medicare Other | Source: Ambulatory Visit | Attending: Cardiology | Admitting: Cardiology

## 2018-08-27 DIAGNOSIS — Z951 Presence of aortocoronary bypass graft: Secondary | ICD-10-CM | POA: Insufficient documentation

## 2018-08-27 DIAGNOSIS — I214 Non-ST elevation (NSTEMI) myocardial infarction: Secondary | ICD-10-CM | POA: Insufficient documentation

## 2018-08-28 ENCOUNTER — Ambulatory Visit: Payer: Medicare Other

## 2018-08-28 NOTE — Addendum Note (Signed)
Encounter addended by: Sol Passer on: 08/28/2018 3:03 PM  Actions taken: Flowsheet data copied forward, Clinical Note Signed

## 2018-08-28 NOTE — Progress Notes (Signed)
Discharge Progress Report  Patient Details  Name: Alex Martin MRN: [id number] Date of Birth: 03-20-1940 Referring Provider:     [place] from 03/29/2018 in [place]  Referring Provider  Candee Furbish, MD        Number of Visits: 1- Patient completed cardiac rehab orientation, but unable to participate in the on-site program due to COVID-19 pandemic. Pt transitioned to virtual cardiac rehab program and is active on the app.  Reason for Discharge:  Patient independent in their exercise.  Smoking History:  Social History   Tobacco Use  Smoking Status Former Smoker  . Years: 10.00  . Quit date: 02/22/1992  . Years since quitting: 26.5  Smokeless Tobacco Never Used  Tobacco Comment   QUIT SMOKING ABOUT 1994 - 1 PP WEEK    Diagnosis:  NSTEMI (non-ST elevated myocardial infarction) ([place]) 02/02/18  S/P CABG x 4 02/05/18  ADL [facility]:   Initial Exercise Prescription: Initial Exercise Prescription - 03/29/18 1000      Date of Initial Exercise RX and Referring Provider   Date  03/29/18    Referring Provider  Candee Furbish, MD     Expected Discharge Date  07/04/18      Treadmill   MPH  1.8    Grade  1    Minutes  10    METs  2.63      Recumbant Bike   Level  1.5    Watts  15    Minutes  10    METs  2.5      NuStep   Level  2    SPM  85    Minutes  10    METs  2.5      Prescription Details   Frequency (times per week)  3x    Duration  Progress to 30 minutes of continuous aerobic without signs/symptoms of physical distress      Intensity   THRR 40-80% of Max Heartrate  08-29-40    Ratings of Perceived Exertion  11-13    Perceived Dyspnea  0-4      Progression   Progression  Continue progressive overload as per policy without signs/symptoms or physical distress.      Resistance Training   Training Prescription  Yes    Weight  4lbs    Reps  10-15       Discharge Exercise Prescription (Final  Exercise Prescription Changes): Exercise Prescription Changes - 04/04/18 1000      Home Exercise Plan   Plans to continue exercise at  Home (comment)    Frequency  Add 4 additional days to program exercise sessions.    Initial Home Exercises Provided  04/04/18       Functional Capacity: 6 Minute Walk    Row Name 03/29/18 1033         6 Minute Walk   Phase  Initial     Distance  1364 feet     Walk Time  6 minutes     # of Rest Breaks  0     MPH  2.6     METS  2.6     RPE  11     Perceived Dyspnea   0     VO2 Peak  9.1     Symptoms  No     Resting HR  64 bpm     Resting BP  110/62     Resting Oxygen Saturation  97 %     Exercise Oxygen Saturation  during 6 min walk  98 %     Max Ex. HR  83 bpm     Max Ex. BP  138/70     2 Minute Post BP  108/58        Psychological, QOL, Others - Outcomes: PHQ 2/9: Depression screen [facility] 2/9 11/14/2017 11/14/2017 11/14/2017 04/11/2017 06/30/2016  Decreased Interest 0 0 0 0 0  Down, Depressed, Hopeless 0 0 0 0 0  PHQ - 2 Score 0 0 0 0 0    Quality of Life: Quality of Life - 03/29/18 1014      Quality of Life   Select  Quality of Life      Quality of Life Scores   Health/Function Pre  28.2 %    Socioeconomic Pre  24.92 %    Psych/Spiritual Pre  27.43 %    Family Pre  27.6 %    GLOBAL Pre  27.2 %       Personal Goals: Goals established at orientation with interventions provided to work toward goal. Personal Goals and Risk Factors at Admission - 03/29/18 1050      Core Components/Risk Factors/Patient Goals on Admission    Weight Management  Yes;Weight Maintenance    Intervention  Weight Management: Develop a combined nutrition and exercise program designed to reach desired caloric intake, while maintaining appropriate intake of nutrient and fiber, sodium and fats, and appropriate energy expenditure required for the weight goal.;Weight Management: Provide education and appropriate resources to help participant work on and  attain dietary goals.;Weight Management/Obesity: Establish reasonable short term and long term weight goals.    Admit Weight  161 lb 13.1 oz (73.4 kg)    Expected Outcomes  Short Term: Continue to assess and modify interventions until short term weight is achieved;Long Term: Adherence to nutrition and physical activity/exercise program aimed toward attainment of established weight goal;Weight Maintenance: Understanding of the daily nutrition guidelines, which includes 25-35% calories from fat, 7% or less cal from saturated fats, less than 200mg  cholesterol, less than 1.5gm of sodium, & 5 or more servings of fruits and vegetables daily;Understanding recommendations for meals to include 15-35% energy as protein, 25-35% energy from fat, 35-60% energy from carbohydrates, less than 200mg  of dietary cholesterol, 20-35 gm of total fiber daily;Understanding of distribution of calorie intake throughout the day with the consumption of 4-5 meals/snacks    Hypertension  Yes    Intervention  Provide education on lifestyle modifcations including regular physical activity/exercise, weight management, moderate sodium restriction and increased consumption of fresh fruit, vegetables, and low fat dairy, alcohol moderation, and smoking cessation.;Monitor prescription use compliance.    Expected Outcomes  Short Term: Continued assessment and intervention until BP is < 140/47mm HG in hypertensive participants. < 130/73mm HG in hypertensive participants with diabetes, heart failure or chronic kidney disease.;Long Term: Maintenance of blood pressure at goal levels.    Lipids  Yes    Intervention  Provide education and support for participant on nutrition & aerobic/resistive exercise along with prescribed medications to achieve LDL 70mg , HDL >40mg .    Expected Outcomes  Short Term: Participant states understanding of desired cholesterol values and is compliant with medications prescribed. Participant is following exercise  prescription and nutrition guidelines.;Long Term: Cholesterol controlled with medications as prescribed, with individualized exercise RX and with personalized nutrition plan. Value goals: LDL < 70mg , HDL > 40 mg.        Personal Goals Discharge:  Exercise Goals and Review: Exercise Goals    Row Name 03/29/18 1035             Exercise Goals   Increase Physical Activity  Yes       Intervention  Provide advice, education, support and counseling about physical activity/exercise needs.;Develop an individualized exercise prescription for aerobic and resistive training based on initial evaluation findings, risk stratification, comorbidities and participant's personal goals.       Expected Outcomes  Short Term: Attend rehab on a regular basis to increase amount of physical activity.;Long Term: Add in home exercise to make exercise part of routine and to increase amount of physical activity.;Long Term: Exercising regularly at least 3-5 days a week.       Increase Strength and Stamina  Yes       Intervention  Provide advice, education, support and counseling about physical activity/exercise needs.;Develop an individualized exercise prescription for aerobic and resistive training based on initial evaluation findings, risk stratification, comorbidities and participant's personal goals.       Expected Outcomes  Short Term: Increase workloads from initial exercise prescription for resistance, speed, and METs.;Short Term: Perform resistance training exercises routinely during rehab and add in resistance training at home;Long Term: Improve cardiorespiratory fitness, muscular endurance and strength as measured by increased METs and functional capacity (6MWT)       Able to understand and use rate of perceived exertion (RPE) scale  Yes       Intervention  Provide education and explanation on how to use RPE scale       Expected Outcomes  Short Term: Able to use RPE daily in rehab to express subjective intensity  level;Long Term:  Able to use RPE to guide intensity level when exercising independently       Knowledge and understanding of Target Heart Rate Range (THRR)  Yes       Intervention  Provide education and explanation of THRR including how the numbers were predicted and where they are located for reference       Expected Outcomes  Short Term: Able to state/look up THRR;Short Term: Able to use daily as guideline for intensity in rehab;Long Term: Able to use THRR to govern intensity when exercising independently       Able to check pulse independently  Yes       Intervention  Provide education and demonstration on how to check pulse in carotid and radial arteries.;Review the importance of being able to check your own pulse for safety during independent exercise       Expected Outcomes  Short Term: Able to explain why pulse checking is important during independent exercise;Long Term: Able to check pulse independently and accurately       Understanding of Exercise Prescription  Yes       Intervention  Provide education, explanation, and written materials on patient's individual exercise prescription       Expected Outcomes  Short Term: Able to explain program exercise prescription;Long Term: Able to explain home exercise prescription to exercise independently          Exercise Goals Re-Evaluation: Exercise Goals Re-Evaluation    Row Name 04/04/18 1027             Exercise Goal Re-Evaluation   Exercise Goals Review  Increase Physical Activity;Increase Strength and Stamina;Able to check pulse independently;Able to understand and use rate of perceived exertion (RPE) scale;Knowledge and understanding of Target Heart Rate Range (THRR);Understanding of Exercise Prescription  Comments  Reviewed HEP with Pt. Pt was responsive and understands THRR, RPE scale, end points of exercise, and weather precautions. Pt is currently walking at home for 30-45 minutes on a treadmill 5-7 days per week.         Expected Outcomes  Will continue to monitor and progress Pt as tolerated.           Nutrition & Weight - Outcomes: Pre Biometrics - 03/29/18 1034      Pre Biometrics   Height  5' 6.5" (1.689 m)    Weight  73.4 kg    Waist Circumference  37.5 inches    Hip Circumference  36.5 inches    Waist to Hip Ratio  1.03 %    BMI (Calculated)  25.73    Triceps Skinfold  20 mm    % Body Fat  27.3 %    Grip Strength  30 kg    Flexibility  0 in    Single Leg Stand  30 seconds        Nutrition: Nutrition Therapy & Goals - 03/29/18 1116      Nutrition Therapy   Diet  heart healthy      Personal Nutrition Goals   Nutrition Goal  Pt to identify and limit food sources of saturated fat, trans fat, refined carbohydrates and sodium    Personal Goal #2  Pt to identify food quantities necessary to achieve weight loss of 6-24 lb at graduation from cardiac rehab.     Personal Goal #3  Pt to build a healthy plate including vegetables, fruits, whole grains, and low-fat dairy products in a heart healthy meal plan.    Personal Goal #4  Pt able to name foods that affect blood glucose.      Intervention Plan   Intervention  Prescribe, educate and counsel regarding individualized specific dietary modifications aiming towards targeted core components such as weight, hypertension, lipid management, diabetes, heart failure and other comorbidities.    Expected Outcomes  Short Term Goal: Understand basic principles of dietary content, such as calories, fat, sodium, cholesterol and nutrients.;Long Term Goal: Adherence to prescribed nutrition plan.       Nutrition Discharge: Nutrition Assessments - 03/29/18 1116      MEDFICTS Scores   Pre Score  52       Education Questionnaire Score: Knowledge Questionnaire Score - 03/29/18 1015      Knowledge Questionnaire Score   Pre Score  21/24       Unable to complete post assessment due to department closure secondary to COVID-19 pandemic.

## 2018-08-29 ENCOUNTER — Ambulatory Visit: Payer: Medicare Other

## 2018-08-29 ENCOUNTER — Other Ambulatory Visit: Payer: Self-pay

## 2018-08-29 VITALS — BP 142/70 | Temp 97.4°F

## 2018-08-29 DIAGNOSIS — I10 Essential (primary) hypertension: Secondary | ICD-10-CM

## 2018-08-29 NOTE — Progress Notes (Signed)
Pt came in a bp check. Checked it on L arm. Pt states he checked it at home and it was 132/73. In office it was 142/70.

## 2018-10-03 ENCOUNTER — Encounter: Payer: Self-pay | Admitting: Nurse Practitioner

## 2018-11-19 ENCOUNTER — Encounter: Payer: Self-pay | Admitting: Cardiology

## 2018-11-19 ENCOUNTER — Other Ambulatory Visit: Payer: Self-pay

## 2018-11-19 ENCOUNTER — Ambulatory Visit: Payer: Medicare Other | Admitting: Cardiology

## 2018-11-19 VITALS — BP 180/60 | HR 62 | Ht 66.5 in | Wt 167.7 lb

## 2018-11-19 DIAGNOSIS — I10 Essential (primary) hypertension: Secondary | ICD-10-CM

## 2018-11-19 DIAGNOSIS — Z951 Presence of aortocoronary bypass graft: Secondary | ICD-10-CM

## 2018-11-19 DIAGNOSIS — E785 Hyperlipidemia, unspecified: Secondary | ICD-10-CM | POA: Diagnosis not present

## 2018-11-19 DIAGNOSIS — I2510 Atherosclerotic heart disease of native coronary artery without angina pectoris: Secondary | ICD-10-CM

## 2018-11-19 MED ORDER — LOSARTAN POTASSIUM 100 MG PO TABS: 100.0000 mg | ORAL_TABLET | Freq: Every day | ORAL | 3 refills | Status: DC

## 2018-11-19 NOTE — Patient Instructions (Addendum)
Medication Instructions:  You may discontinue your Plavix. Increase Losartan to 100 mg a day. Continue all other medications as listed  *If you need a refill on your cardiac medications before your next appointment, please call your pharmacy*  Follow-Up: At [facility], you and your health needs are our priority.  As part of our continuing mission to provide you with exceptional heart care, we have created designated Provider Care Teams.  These Care Teams include your primary Cardiologist (physician) and Advanced Practice Providers (APPs -  Physician Assistants and Nurse Practitioners) who all work together to provide you with the care you need, when you need it.  Your next appointment:   12 months  The format for your next appointment:   In Person  Provider:   You may see Candee Furbish, MD or one of the following Advanced Practice Providers on your designated Care Team:    Truitt Merle, NP  Cecilie Kicks, NP  Kathyrn Drown, NP   Thank you for choosing [facility]!!

## 2018-11-19 NOTE — Progress Notes (Signed)
Cardiology Office Note:    Date:  11/19/2018   ID:  [name], DOB Aug 05, 1940, MRN [id number]  PCP:  Alex Frizzle, MD  Cardiologist:  Candee Furbish, MD  Electrophysiologist:  None   Referring MD: Alex Frizzle, MD     History of Present Illness:    Alex Martin is a 78 y.o. male here for the follow-up of coronary artery disease status post CABG 02/05/2018.  I admitted him to the hospital with chest discomfort and elevated troponin consistent with non-ST elevation myocardial infarction.  Originally woke up from sleep at 230 with substernal chest discomfort radiating to his back and bilateral arms it was very intense.  ST depressions were noted in V5 and V6.  Original troponin was elevated at 2.28.  He then went forward with cardiac catheterization which revealed triple-vessel disease.  Dr. Dema Severin was consulted and performed CABG on 02/05/2018 x4 LIMA to LAD free RIMA to OM, sequential SVG to PDA and PL.  He was discharged on 02/10/2018.  He did develop some A. fib with RVR that was treated with IV amiodarone and successfully converted to normal sinus rhythm.  He was started on Plavix for ACS.  P.o. amiodarone was discontinued after having side effects.  There was some suspicion that some of the side effects could have been alcohol withdrawal.  No further palpitations.  No fevers chills nausea vomiting syncope bleeding.  There was comfort and potentially discontinuing his Lasix during yesterday's visit with cardiothoracic surgery PA.  He did see Truitt Merle who increased furosemide from 40-60 back on 02/21/2018.  Appears to be euvolemic currently.  EF was reduced 30 to 35% with apical lateral akinesis.  This was pre-surgery.  11/19/2018-here for follow-up of CAD ischemic cardiomyopathy.  Last patient message was demonstrating some slightly higher blood pressures.  This is back in July 2020.  I recommended that he try taking the losartan after dinner.  He was going to record  some blood pressures during the experience.  He was back to kayaking. Reassuringly, his post CABG echocardiogram showed EF of 60%.  Did have some slight numbness at his chest scar previously.  Past Medical History:  Diagnosis Date  . Abdominal distension    UMBILICAL HERNIA-NO PAIN  . Bleeding from the nose    SEVERAL EPISODES THAT REQUIRED TX IN ER--LAST EPISODE WAS APPROX 1 YR AGO  . Blood in urine    MICROSCOPIC BLOODEVALUATED BY DR. NESI FRIDAY 02/11/11--URINE SAMPLE AND EXAM-- AND BLOOD SAMPLE AND TO BE SEEN AGAIN IN 6 MONTHS AND GIVEN VESICARE  FOR FREQUENCY AND URGENCY  . Coronary artery disease   . GERD (gastroesophageal reflux disease)   . Hyperlipidemia   . Hypertension     Past Surgical History:  Procedure Laterality Date  . CARDIAC CATHETERIZATION    . CORONARY ARTERY BYPASS GRAFT N/A 02/05/2018   Procedure: CORONARY ARTERY BYPASS GRAFTING (CABG) x4 USING BILATERAL IMA'S WITH LEFT GREATER SAPHENOUS VEIN HARVEST AND EXPLORATION OF RIGHT SAPHENOUS;  Surgeon: Alex Pollack, MD;  Location: [place];  Service: Open Heart Surgery;  Laterality: N/A;  . [place]  . LEFT HEART CATH AND CORONARY ANGIOGRAPHY N/A 02/02/2018   Procedure: LEFT HEART CATH AND CORONARY ANGIOGRAPHY;  Surgeon: Martin, Alex M, MD;  Location: [place] [facility];  Service: Cardiovascular;  Laterality: N/A;  . TEE WITHOUT CARDIOVERSION N/A 02/05/2018   Procedure: TRANSESOPHAGEAL ECHOCARDIOGRAM (TEE);  Surgeon: Alex Pollack, MD;  Location: [facility]  OR;  Service: Open Heart Surgery;  Laterality: N/A;  . UMBILICAL HERNIA REPAIR  02/15/2011   Procedure: HERNIA REPAIR UMBILICAL ADULT;  Surgeon: Alex Hollingshead, MD;  Location: [facility];  Service: General;  Laterality: N/A;  umbilical hernia repair with mesh  . VASECTOMY  1976    Current Medications: Current Meds  Medication Sig  . aspirin EC 81 MG tablet Take 81 mg by mouth daily.  Marland Kitchen atorvastatin (LIPITOR) 80 MG tablet Take 1 tablet (80 mg total) by  mouth daily at 6 PM.  . Cyanocobalamin (VITAMIN B-12 PO) Take 1 tablet by mouth daily.  Marland Kitchen latanoprost (XALATAN) 0.005 % ophthalmic solution Place 1 drop into both eyes at bedtime.   . metoprolol succinate (TOPROL-XL) 50 MG 24 hr tablet Take 1 tablet (50 mg total) by mouth daily. Take with or immediately following a meal.  . Multiple Vitamin (MULTIVITAMIN) tablet Take 1 tablet by mouth daily.  Marland Kitchen NEXIUM 40 MG capsule TAKE 1 CAPSULE BY MOUTH EVERY DAY  . solifenacin (VESICARE) 5 MG tablet Take 5 mg by mouth daily.  . timolol (TIMOPTIC) 0.5 % ophthalmic solution Place 1 drop into both eyes daily.  . [DISCONTINUED] clopidogrel (PLAVIX) 75 MG tablet Take 1 tablet (75 mg total) by mouth daily.  . [DISCONTINUED] losartan (COZAAR) 50 MG tablet Take 1 tablet (50 mg total) by mouth daily.     Allergies:   Amlodipine and Shellfish allergy   Social History   Socioeconomic History  . Marital status: Divorced    Spouse name: Not on file  . Number of children: Not on file  . Years of education: Not on file  . Highest education level: Bachelor's degree (e.g., BA, AB, BS)  Occupational History  . Not on file  Social Needs  . Financial resource strain: Not very hard  . Food insecurity    Worry: Never true    Inability: Never true  . Transportation needs    Medical: No    Non-medical: No  Tobacco Use  . Smoking status: Former Smoker    Years: 10.00    Quit date: 02/22/1992    Years since quitting: 26.7  . Smokeless tobacco: Never Used  . Tobacco comment: QUIT SMOKING ABOUT 1994 - 1 PP WEEK  Substance and Sexual Activity  . Alcohol use: Yes    Comment: 2 GLASSES WINE DAILY  . Drug use: No  . Sexual activity: Not on file  Lifestyle  . Physical activity    Days per week: 5 days    Minutes per session: 50 min  . Stress: Only a little  Relationships  . Social Herbalist on phone: Not on file    Gets together: Not on file    Attends religious service: Not on file    Active member  of club or organization: Not on file    Attends meetings of clubs or organizations: Not on file    Relationship status: Not on file  Other Topics Concern  . Not on file  Social History Narrative  . Not on file     Family History: The patient's family history includes Cancer in his sister; Heart disease in his father and mother.  ROS:   Please see the history of present illness.    Denies any fevers chills nausea vomiting syncope bleeding all other systems reviewed and are negative.  EKGs/Labs/Other Studies Reviewed:    The following studies were reviewed today: Prior hospital records cardiac catheterization echocardiogram lab  work  EKG:  EKG is not ordered today.    Recent Labs: 02/06/2018: Magnesium 2.5 02/21/2018: NT-Pro BNP 2,426 06/20/2018: ALT 25; BUN 17; Creatinine, Ser 0.90; Hemoglobin 12.1; Platelets 285; Potassium 4.5; Sodium 143  Recent Lipid Panel    Component Value Date/Time   CHOL 165 06/20/2018 0746   TRIG 162 (H) 06/20/2018 0746   HDL 63 06/20/2018 0746   CHOLHDL 2.6 06/20/2018 0746   CHOLHDL 3.3 02/03/2018 0500   VLDL 25 02/03/2018 0500   LDLCALC 70 06/20/2018 0746   LDLCALC 97 11/07/2017 1017    Physical Exam:    VS:  BP (!) 180/60   Pulse 62   Ht 5' 6.5" (1.689 m)   Wt 167 lb 11.2 oz (76.1 kg)   SpO2 96%   BMI 26.66 kg/m     Wt Readings from Last 3 Encounters:  11/19/18 167 lb 11.2 oz (76.1 kg)  06/19/18 164 lb (74.4 kg)  03/29/18 161 lb 13.1 oz (73.4 kg)     GEN:  Well nourished, well developed in no acute distress HEENT: Normal NECK: No JVD; No carotid bruits LYMPHATICS: No lymphadenopathy CARDIAC: RRR, 2/6 DM, no rubs, gallops, CABG scar well-healing RESPIRATORY:  Clear to auscultation without rales, wheezing or rhonchi  ABDOMEN: Soft, non-tender, non-distended MUSCULOSKELETAL:  No edema; No deformity  SKIN: Warm and dry NEUROLOGIC:  Alert and oriented x 3 PSYCHIATRIC:  Normal affect   ASSESSMENT:    1. Coronary artery disease  involving native coronary artery of native heart without angina pectoris   2. S/P CABG (coronary artery bypass graft)   3. Hyperlipidemia, unspecified hyperlipidemia type   4. Essential hypertension    PLAN:    In order of problems listed above:  Coronary artery disease status post CABG -January 2020.  In the setting of non-ST elevation myocardial infarction.  EF was reduced, now normal.  Virtual cardiac rehab.  Excellent resolution of pump function.  Doing great.  We will go ahead and stop his Plavix at this time.  Aspirin only.  Non-ST elevation myocardial infarction -Continue with aspirin.  Stopping Plavix at this time.  Paroxysmal atrial fibrillation - Brief postop, off of amiodarone now.   Essential hypertension -Increase losartan to 100 mg once a day, continue Toprol at 50.  Blood pressure was elevated at today's visit.  He does check at home periodically.  Aortic regurgitation -Mild, diastolic murmur heard on exam.  Hyperlipidemia -On high intensity statin therapy.  LDL goal less than 70.  Repeat lipid panel at next visit.  Medication Adjustments/Labs and Tests Ordered: Current medicines are reviewed at length with the patient today.  Concerns regarding medicines are outlined above.  No orders of the defined types were placed in this encounter.  Meds ordered this encounter  Medications  . losartan (COZAAR) 100 MG tablet    Sig: Take 1 tablet (100 mg total) by mouth daily.    Dispense:  90 tablet    Refill:  3    Patient Instructions  Medication Instructions:  You may discontinue your Plavix. Increase Losartan to 100 mg a day. Continue all other medications as listed  *If you need a refill on your cardiac medications before your next appointment, please call your pharmacy*  Follow-Up: At [facility], you and your health needs are our priority.  As part of our continuing mission to provide you with exceptional heart care, we have created designated Provider  Care Teams.  These Care Teams include your primary Cardiologist (physician) and Advanced  Practice Providers (APPs -  Physician Assistants and Nurse Practitioners) who all work together to provide you with the care you need, when you need it.  Your next appointment:   12 months  The format for your next appointment:   In Person  Provider:   You may see Candee Furbish, MD or one of the following Advanced Practice Providers on your designated Care Team:    Truitt Merle, NP  Cecilie Kicks, NP  Kathyrn Drown, NP   Thank you for choosing [facility]!!        Signed, Candee Furbish, MD  11/19/2018 5:08 PM    [place]

## 2019-02-18 ENCOUNTER — Encounter (HOSPITAL_COMMUNITY): Payer: Self-pay | Admitting: *Deleted

## 2019-02-18 DIAGNOSIS — I214 Non-ST elevation (NSTEMI) myocardial infarction: Secondary | ICD-10-CM

## 2019-02-18 DIAGNOSIS — Z951 Presence of aortocoronary bypass graft: Secondary | ICD-10-CM

## 2019-02-18 NOTE — Progress Notes (Signed)
Alex Martin has completed the virtual cardiac rehab program and will continue exercise independently at this time. Patient's virtual cardiac rehab report will be scanned to media for review.  Sol Passer, MS, ACSM CEP

## 2019-03-03 ENCOUNTER — Other Ambulatory Visit: Payer: Self-pay | Admitting: Cardiology

## 2019-04-09 ENCOUNTER — Other Ambulatory Visit: Payer: Self-pay | Admitting: Family Medicine

## 2019-04-16 ENCOUNTER — Telehealth: Payer: Self-pay | Admitting: *Deleted

## 2019-04-16 ENCOUNTER — Other Ambulatory Visit: Payer: Self-pay

## 2019-04-16 ENCOUNTER — Encounter: Payer: Self-pay | Admitting: Family Medicine

## 2019-04-16 ENCOUNTER — Ambulatory Visit (INDEPENDENT_AMBULATORY_CARE_PROVIDER_SITE_OTHER): Payer: Medicare PPO | Admitting: Family Medicine

## 2019-04-16 VITALS — BP 180/80 | HR 66 | Temp 97.2°F | Resp 16 | Ht 67.0 in | Wt 170.0 lb

## 2019-04-16 DIAGNOSIS — Z951 Presence of aortocoronary bypass graft: Secondary | ICD-10-CM

## 2019-04-16 DIAGNOSIS — H9113 Presbycusis, bilateral: Secondary | ICD-10-CM

## 2019-04-16 DIAGNOSIS — Z0000 Encounter for general adult medical examination without abnormal findings: Secondary | ICD-10-CM

## 2019-04-16 DIAGNOSIS — I10 Essential (primary) hypertension: Secondary | ICD-10-CM | POA: Diagnosis not present

## 2019-04-16 DIAGNOSIS — E7800 Pure hypercholesterolemia, unspecified: Secondary | ICD-10-CM

## 2019-04-16 DIAGNOSIS — Z0001 Encounter for general adult medical examination with abnormal findings: Secondary | ICD-10-CM | POA: Diagnosis not present

## 2019-04-16 DIAGNOSIS — I252 Old myocardial infarction: Secondary | ICD-10-CM | POA: Diagnosis not present

## 2019-04-16 LAB — CBC WITH DIFFERENTIAL/PLATELET
Absolute Monocytes: 649 cells/uL (ref 200–950)
Basophils Absolute: 99 cells/uL (ref 0–200)
Basophils Relative: 1.8 %
Eosinophils Absolute: 473 cells/uL (ref 15–500)
Eosinophils Relative: 8.6 %
HCT: 43.2 % (ref 38.5–50.0)
Hemoglobin: 14.3 g/dL (ref 13.2–17.1)
Lymphs Abs: 1793 cells/uL (ref 850–3900)
MCH: 32.2 pg (ref 27.0–33.0)
MCHC: 33.1 g/dL (ref 32.0–36.0)
MCV: 97.3 fL (ref 80.0–100.0)
MPV: 10.6 fL (ref 7.5–12.5)
Monocytes Relative: 11.8 %
Neutro Abs: 2486 cells/uL (ref 1500–7800)
Neutrophils Relative %: 45.2 %
Platelets: 190 10*3/uL (ref 140–400)
RBC: 4.44 10*6/uL (ref 4.20–5.80)
RDW: 13.9 % (ref 11.0–15.0)
Total Lymphocyte: 32.6 %
WBC: 5.5 10*3/uL (ref 3.8–10.8)

## 2019-04-16 LAB — COMPLETE METABOLIC PANEL WITH GFR
AG Ratio: 1.6 (calc) (ref 1.0–2.5)
ALT: 21 U/L (ref 9–46)
AST: 23 U/L (ref 10–35)
Albumin: 4.2 g/dL (ref 3.6–5.1)
Alkaline phosphatase (APISO): 62 U/L (ref 35–144)
BUN/Creatinine Ratio: 26 (calc) — ABNORMAL HIGH (ref 6–22)
BUN: 29 mg/dL — ABNORMAL HIGH (ref 7–25)
CO2: 30 mmol/L (ref 20–32)
Calcium: 9.6 mg/dL (ref 8.6–10.3)
Chloride: 106 mmol/L (ref 98–110)
Creat: 1.11 mg/dL (ref 0.70–1.18)
GFR, Est African American: 73 mL/min/{1.73_m2} (ref >=60)
GFR, Est Non African American: 63 mL/min/{1.73_m2} (ref >=60)
Globulin: 2.7 g/dL (calc) (ref 1.9–3.7)
Glucose, Bld: 91 mg/dL (ref 65–99)
Potassium: 5.2 mmol/L (ref 3.5–5.3)
Sodium: 142 mmol/L (ref 135–146)
Total Bilirubin: 0.6 mg/dL (ref 0.2–1.2)
Total Protein: 6.9 g/dL (ref 6.1–8.1)

## 2019-04-16 LAB — LIPID PANEL
Cholesterol: 181 mg/dL (ref <200)
HDL: 51 mg/dL (ref >=40)
LDL Cholesterol (Calc): 98 mg/dL (calc)
Non-HDL Cholesterol (Calc): 130 mg/dL (calc) — ABNORMAL HIGH (ref <130)
Total CHOL/HDL Ratio: 3.5 (calc) (ref <5.0)
Triglycerides: 199 mg/dL — ABNORMAL HIGH (ref <150)

## 2019-04-16 MED ORDER — HYDROCHLOROTHIAZIDE 25 MG PO TABS: 25.0000 mg | ORAL_TABLET | Freq: Every day | ORAL | 3 refills | Status: DC

## 2019-04-16 NOTE — Telephone Encounter (Signed)
Received verbal orders for Cologuard.   Order placed via [employer].   Cologuard (Order IS:[id number])

## 2019-04-16 NOTE — Telephone Encounter (Signed)
-----   Message from Alyson Locket, [place] sent at 04/16/2019  9:38 AM EDT -----  ----- Message ----- From: Susy Frizzle, MD Sent: 04/16/2019   8:42 AM EDT To: Alyson Locket, RMA  Please schedule cologuard

## 2019-04-16 NOTE — Progress Notes (Signed)
Subjective:    Patient ID: Alex Martin, male    DOB: [birth year], 79 y.o.   MRN: [id number]  HPI Patient is a very pleasant 79 year old Caucasian male who presents today for complete physical exam.  Since I last saw the patient in 2019, he suffered a non-ST elevation myocardial infarction.  He underwent coronary artery bypass grafting x4.  He states that he was initially very fatigued after the incident however he has improved dramatically since then and is subjectively back to his baseline.  He is taking an aspirin.  He is also on a beta-blocker as well as losartan.  He is on maximum dose Lipitor.  He denies any chest pain shortness of breath or dyspnea on exertion today.  He denies any myalgias or right upper quadrant pain on a statin.  His last colonoscopy was in 2011.  He will be due this year.  We discussed the risk and benefits and he elects not to perform a colonoscopy though would like to proceed with a Cologuard.  I would not recommend prostate cancer screening at this point given his age.  Immunization records are below and are up-to-date.  He denies any recent falls.  He denies any depression.  He denies any issues with his memory. Immunization History  Administered Date(s) Administered  . Influenza, High Dose Seasonal PF 11/01/2012, 10/14/2015, 09/17/2016, 09/15/2017, 09/11/2018, 09/11/2018  . Influenza,inj,Quad PF,6+ Mos 10/29/2014  . Influenza-Unspecified 10/04/2013, 10/14/2015, 09/21/2017, 09/25/2018  . Moderna SARS-COVID-2 Vaccination 01/31/2019, 02/28/2019  . Pneumococcal Conjugate-13 10/29/2014  . Pneumococcal Polysaccharide-23 06/30/2016  . Tdap 06/17/2012  . Zoster 01/18/2007  . Zoster Recombinat (Shingrix) 11/16/2017    Past Medical History:  Diagnosis Date  . Abdominal distension    UMBILICAL HERNIA-NO PAIN  . Bleeding from the nose    SEVERAL EPISODES THAT REQUIRED TX IN ER--LAST EPISODE WAS APPROX 1 YR AGO  . Blood in urine    MICROSCOPIC BLOODEVALUATED BY DR.  NESI FRIDAY 02/11/11--URINE SAMPLE AND EXAM-- AND BLOOD SAMPLE AND TO BE SEEN AGAIN IN 6 MONTHS AND GIVEN VESICARE  FOR FREQUENCY AND URGENCY  . Coronary artery disease   . GERD (gastroesophageal reflux disease)   . Hyperlipidemia   . Hypertension    Past Surgical History:  Procedure Laterality Date  . CARDIAC CATHETERIZATION    . CORONARY ARTERY BYPASS GRAFT N/A 02/05/2018   Procedure: CORONARY ARTERY BYPASS GRAFTING (CABG) x4 USING BILATERAL IMA'S WITH LEFT GREATER SAPHENOUS VEIN HARVEST AND EXPLORATION OF RIGHT SAPHENOUS;  Surgeon: Gaye Pollack, MD;  Location: [place];  Service: Open Heart Surgery;  Laterality: N/A;  . [place]  . LEFT HEART CATH AND CORONARY ANGIOGRAPHY N/A 02/02/2018   Procedure: LEFT HEART CATH AND CORONARY ANGIOGRAPHY;  Surgeon: Martinique, Peter M, MD;  Location: [place] [facility];  Service: Cardiovascular;  Laterality: N/A;  . TEE WITHOUT CARDIOVERSION N/A 02/05/2018   Procedure: TRANSESOPHAGEAL ECHOCARDIOGRAM (TEE);  Surgeon: Gaye Pollack, MD;  Location: [place];  Service: Open Heart Surgery;  Laterality: N/A;  . UMBILICAL HERNIA REPAIR  02/15/2011   Procedure: HERNIA REPAIR UMBILICAL ADULT;  Surgeon: Odis Hollingshead, MD;  [facility]: [facility];  Service: General;  Laterality: N/A;  umbilical hernia repair with mesh  . VASECTOMY  1976   Current Outpatient Medications on File Prior to Visit  Medication Sig Dispense Refill  . aspirin EC 81 MG tablet Take 81 mg by mouth daily.    Marland Kitchen atorvastatin (LIPITOR) 80 MG tablet  Take 1 tablet (80 mg total) by mouth daily at 6 PM. 90 tablet 3  . Cyanocobalamin (VITAMIN B-12 PO) Take 1 tablet by mouth daily.    Marland Kitchen latanoprost (XALATAN) 0.005 % ophthalmic solution Place 1 drop into both eyes at bedtime.   0  . losartan (COZAAR) 100 MG tablet Take 1 tablet (100 mg total) by mouth daily. 90 tablet 3  . metoprolol succinate (TOPROL-XL) 50 MG 24 hr tablet TAKE 1 TABLET BY MOUTH EVERY DAY WITH OR IMMEDIATELY FOLLOWING A MEAL 90  tablet 2  . Multiple Vitamin (MULTIVITAMIN) tablet Take 1 tablet by mouth daily.    Marland Kitchen NEXIUM 40 MG capsule TAKE 1 CAPSULE BY MOUTH EVERY DAY 90 capsule 3  . solifenacin (VESICARE) 5 MG tablet Take 5 mg by mouth daily.    . timolol (TIMOPTIC) 0.5 % ophthalmic solution Place 1 drop into both eyes daily.     No current facility-administered medications on file prior to visit.   Allergies  Allergen Reactions  . Amlodipine Swelling    swelling in ankles  . Shellfish Allergy Other (See Comments)    N&V AFTER EATING MUSSELS   Social History   Socioeconomic History  . Marital status: Divorced    Spouse name: Not on file  . Number of children: Not on file  . Years of education: Not on file  . Highest education level: Bachelor's degree (e.g., BA, AB, BS)  Occupational History  . Not on file  Tobacco Use  . Smoking status: Former Smoker    Years: 10.00    Quit date: 02/22/1992    Years since quitting: 27.1  . Smokeless tobacco: Never Used  . Tobacco comment: QUIT SMOKING ABOUT 1994 - 1 PP WEEK  Substance and Sexual Activity  . Alcohol use: Yes    Comment: 2 GLASSES WINE DAILY  . Drug use: No  . Sexual activity: Not on file  Other Topics Concern  . Not on file  Social History Narrative  . Not on file   Social Determinants of Health   Financial Resource Strain:   . Difficulty of Paying Living Expenses:   Food Insecurity:   . Worried About Charity fundraiser in the Last Year:   . Arboriculturist in the Last Year:   Transportation Needs:   . Film/video editor (Medical):   Marland Kitchen Lack of Transportation (Non-Medical):   Physical Activity:   . Days of Exercise per Week:   . Minutes of Exercise per Session:   Stress:   . Feeling of Stress :   Social Connections:   . Frequency of Communication with Friends and Family:   . Frequency of Social Gatherings with Friends and Family:   . Attends Religious Services:   . Active Member of Clubs or Organizations:   . Attends Theatre manager Meetings:   Marland Kitchen Marital Status:   Intimate Partner Violence:   . Fear of Current or Ex-Partner:   . Emotionally Abused:   Marland Kitchen Physically Abused:   . Sexually Abused:       Review of Systems  All other systems reviewed and are negative.      Objective:   Physical Exam  Constitutional: He is oriented to person, place, and time. He appears well-developed and well-nourished. No distress.  HENT:  Head: Normocephalic and atraumatic.  Right Ear: External ear normal.  Left Ear: External ear normal.  Nose: Nose normal.  Mouth/Throat: Oropharynx is clear and moist. No oropharyngeal exudate.  Eyes: Pupils are equal, round, and reactive to light. Conjunctivae and EOM are normal. Right eye exhibits no discharge. Left eye exhibits no discharge. No scleral icterus.  Neck: No JVD present. No tracheal deviation present. No thyromegaly present.  Cardiovascular: Normal rate, regular rhythm, normal heart sounds and intact distal pulses. Exam reveals no gallop and no friction rub.  No murmur heard. Pulmonary/Chest: Effort normal and breath sounds normal. No stridor. No respiratory distress. He has no wheezes. He has no rales. He exhibits no tenderness.  Abdominal: Soft. Bowel sounds are normal. He exhibits no distension and no mass. There is no abdominal tenderness. There is no rebound and no guarding.  Musculoskeletal:        General: No tenderness, deformity or edema. Normal range of motion.     Cervical back: Normal range of motion and neck supple.  Lymphadenopathy:    He has no cervical adenopathy.  Neurological: He is alert and oriented to person, place, and time. He has normal reflexes. No cranial nerve deficit. He exhibits normal muscle tone. Coordination normal.  Skin: Skin is warm. No rash noted. He is not diaphoretic. No erythema. No pallor.  Psychiatric: He has a normal mood and affect. His behavior is normal. Judgment and thought content normal.  Vitals reviewed.          Assessment & Plan:  Pure hypercholesterolemia - Plan: CBC with Differential/Platelet, COMPLETE METABOLIC PANEL WITH GFR, Lipid panel  Benign essential HTN  General medical exam  Presbycusis of both ears  S/P CABG x 4  History of acute myocardial infarction  I will schedule the patient for Cologuard screening.  Blood pressure today is extremely high.  He has been checking his blood pressure at home and finding his systolic blood pressure in the 150s.  Therefore I will add hydrochlorothiazide 25 mg a day and recheck blood pressure in 2 weeks.  Meanwhile check a CBC, CMP, and fasting lipid panel.  Recommended against prostate cancer screening.  Patient denies any issues with depression, falls, or memory loss.  Immunizations are up-to-date.  Regular anticipatory guidance is provided.  Patient does have numerous actinic keratoses on his scalp and on the crown of his head.  I recommended the patient follow-up with his dermatologist for treatment.

## 2019-04-17 ENCOUNTER — Other Ambulatory Visit: Payer: Self-pay | Admitting: Family Medicine

## 2019-04-17 MED ORDER — ESOMEPRAZOLE MAGNESIUM 40 MG PO CPDR: 40.0000 mg | DELAYED_RELEASE_CAPSULE | Freq: Every day | ORAL | 3 refills | Status: DC

## 2019-04-24 DIAGNOSIS — Z1211 Encounter for screening for malignant neoplasm of colon: Secondary | ICD-10-CM | POA: Diagnosis not present

## 2019-04-24 DIAGNOSIS — Z1212 Encounter for screening for malignant neoplasm of rectum: Secondary | ICD-10-CM | POA: Diagnosis not present

## 2019-04-24 LAB — COLOGUARD

## 2019-04-28 LAB — COLOGUARD: COLOGUARD: POSITIVE — AB

## 2019-04-30 ENCOUNTER — Other Ambulatory Visit: Payer: Self-pay

## 2019-04-30 ENCOUNTER — Ambulatory Visit: Payer: Medicare PPO

## 2019-04-30 ENCOUNTER — Telehealth: Payer: Self-pay | Admitting: Family Medicine

## 2019-04-30 ENCOUNTER — Other Ambulatory Visit: Payer: Self-pay | Admitting: Family Medicine

## 2019-04-30 DIAGNOSIS — Z0130 Encounter for examination of blood pressure without abnormal findings: Secondary | ICD-10-CM

## 2019-04-30 DIAGNOSIS — R195 Other fecal abnormalities: Secondary | ICD-10-CM

## 2019-04-30 NOTE — Progress Notes (Signed)
Patient came in today for a blood pressure check. I checked patient's manual BP at rest in the left arm with normal cuff and BP was 158/62. Checked patient's BP with his electronic cuff from home BP read 138/73. Patient states that it always reads on the lower end at home but BP was similar to my manual reading when he came into the office. Will send message to patient's PCP and notify him of patient's BP reading.

## 2019-04-30 NOTE — Telephone Encounter (Signed)
Patient came in today for a blood pressure check. I checked patient's manual BP at rest in the left arm with normal cuff and BP was 158/62. Checked patient's BP with his electronic cuff from home BP read 138/73. Patient's BP manual checked again was same as first manual BP. Patient has been on HCTZ since 04/16/19. Please advise?

## 2019-04-30 NOTE — Telephone Encounter (Signed)
Pt called back and states that the pharmacy called him and he was telling them about his back pain and they recommended that he call us as he may need his electrolytes checked????

## 2019-05-01 ENCOUNTER — Encounter: Payer: Self-pay | Admitting: Gastroenterology

## 2019-05-02 NOTE — Telephone Encounter (Signed)
Do not feel electrolytes would cause back pain.  Not sure of the cause of his back pain but likely arthritis or muscular (just a guess).  BP is still slightly elevated.I'd like to see him to recheck his bp personally and check bmp and eval his back.

## 2019-05-02 NOTE — Telephone Encounter (Signed)
Pt aware and apt made 

## 2019-05-03 ENCOUNTER — Ambulatory Visit (INDEPENDENT_AMBULATORY_CARE_PROVIDER_SITE_OTHER): Payer: Medicare PPO | Admitting: Family Medicine

## 2019-05-03 ENCOUNTER — Ambulatory Visit
Admission: RE | Admit: 2019-05-03 | Discharge: 2019-05-03 | Disposition: A | Discharge status: Home / Self Care | Payer: Medicare Other | Source: Ambulatory Visit | Attending: Family Medicine | Admitting: Family Medicine

## 2019-05-03 ENCOUNTER — Other Ambulatory Visit: Payer: Self-pay

## 2019-05-03 ENCOUNTER — Encounter: Payer: Self-pay | Admitting: Family Medicine

## 2019-05-03 VITALS — BP 142/70 | HR 55 | Temp 96.4°F | Resp 14 | Ht 67.0 in | Wt 170.0 lb

## 2019-05-03 DIAGNOSIS — M5432 Sciatica, left side: Secondary | ICD-10-CM

## 2019-05-03 DIAGNOSIS — M545 Low back pain: Secondary | ICD-10-CM | POA: Diagnosis not present

## 2019-05-03 MED ORDER — DOXAZOSIN MESYLATE 2 MG PO TABS: 2.0000 mg | ORAL_TABLET | Freq: Every day | ORAL | 1 refills | Status: DC

## 2019-05-03 NOTE — Progress Notes (Signed)
Subjective:    Patient ID: Alex Martin, male    DOB: [birth year], 79 y.o.   MRN: [id number]  Hypertension  Back Pain    Patient states that he has been having low back pain for several months.  The pain is located in the lumbar spine area.  Is located to the left of the center of his back and just above his left gluteus.  It also radiates down into his left gluteus and left posterior thigh.  The pain comes and goes.  There is no exacerbating or alleviating factors.  He denies any symptoms of cauda equina such as saddle anesthesia or bowel or bladder incontinence or leg weakness.  His blood pressure has been between [id number] and [id number] systolic every time we checked it.  I checked it and found to be 148/70.  His machine at home is consistently 10-15 points lower than what we are checking and therefore I do not believe is accurate. Past Medical History:  Diagnosis Date  . Abdominal distension    UMBILICAL HERNIA-NO PAIN  . Bleeding from the nose    SEVERAL EPISODES THAT REQUIRED TX IN ER--LAST EPISODE WAS APPROX 1 YR AGO  . Blood in urine    MICROSCOPIC BLOODEVALUATED BY DR. NESI FRIDAY 02/11/11--URINE SAMPLE AND EXAM-- AND BLOOD SAMPLE AND TO BE SEEN AGAIN IN 6 MONTHS AND GIVEN VESICARE  FOR FREQUENCY AND URGENCY  . Coronary artery disease   . GERD (gastroesophageal reflux disease)   . Hyperlipidemia   . Hypertension    Past Surgical History:  Procedure Laterality Date  . CARDIAC CATHETERIZATION    . CORONARY ARTERY BYPASS GRAFT N/A 02/05/2018   Procedure: CORONARY ARTERY BYPASS GRAFTING (CABG) x4 USING BILATERAL IMA'S WITH LEFT GREATER SAPHENOUS VEIN HARVEST AND EXPLORATION OF RIGHT SAPHENOUS;  Surgeon: Gaye Pollack, MD;  Location: [place];  Service: Open Heart Surgery;  Laterality: N/A;  . [place]  . LEFT HEART CATH AND CORONARY ANGIOGRAPHY N/A 02/02/2018   Procedure: LEFT HEART CATH AND CORONARY ANGIOGRAPHY;  Surgeon: Martinique, Peter M, MD;  Location: [facility];   Service: Cardiovascular;  Laterality: N/A;  . TEE WITHOUT CARDIOVERSION N/A 02/05/2018   Procedure: TRANSESOPHAGEAL ECHOCARDIOGRAM (TEE);  Surgeon: Gaye Pollack, MD;  Location: [facility];  Service: Open Heart Surgery;  Laterality: N/A;  . UMBILICAL HERNIA REPAIR  02/15/2011   Procedure: HERNIA REPAIR UMBILICAL ADULT;  Surgeon: Odis Hollingshead, MD;  Location: [facility];  Service: General;  Laterality: N/A;  umbilical hernia repair with mesh  . VASECTOMY  1976   Current Outpatient Medications on File Prior to Visit  Medication Sig Dispense Refill  . aspirin EC 81 MG tablet Take 81 mg by mouth daily.    Marland Kitchen atorvastatin (LIPITOR) 80 MG tablet Take 1 tablet (80 mg total) by mouth daily at 6 PM. 90 tablet 3  . Cyanocobalamin (VITAMIN B-12 PO) Take 1 tablet by mouth daily.    Marland Kitchen esomeprazole (NEXIUM) 40 MG capsule Take 1 capsule (40 mg total) by mouth daily at 12 noon. 90 capsule 3  . hydrochlorothiazide (HYDRODIURIL) 25 MG tablet Take 1 tablet (25 mg total) by mouth daily. 90 tablet 3  . latanoprost (XALATAN) 0.005 % ophthalmic solution Place 1 drop into both eyes at bedtime.   0  . losartan (COZAAR) 100 MG tablet Take 1 tablet (100 mg total) by mouth daily. 90 tablet 3  . metoprolol succinate (TOPROL-XL) 50 MG 24 hr tablet  TAKE 1 TABLET BY MOUTH EVERY DAY WITH OR IMMEDIATELY FOLLOWING A MEAL 90 tablet 2  . Multiple Vitamin (MULTIVITAMIN) tablet Take 1 tablet by mouth daily.    . solifenacin (VESICARE) 5 MG tablet Take 5 mg by mouth daily.    . timolol (TIMOPTIC) 0.5 % ophthalmic solution Place 1 drop into both eyes daily.     No current facility-administered medications on file prior to visit.   Allergies  Allergen Reactions  . Amlodipine Swelling    swelling in ankles  . Shellfish Allergy Other (See Comments)    N&V AFTER EATING MUSSELS   Social History   Socioeconomic History  . Marital status: Divorced    Spouse name: Not on file  . Number of children: Not on file  . Years of education:  Not on file  . Highest education level: Bachelor's degree (e.g., BA, AB, BS)  Occupational History  . Not on file  Tobacco Use  . Smoking status: Former Smoker    Years: 10.00    Quit date: 02/22/1992    Years since quitting: 27.2  . Smokeless tobacco: Never Used  . Tobacco comment: QUIT SMOKING ABOUT 1994 - 1 PP WEEK  Substance and Sexual Activity  . Alcohol use: Yes    Comment: 2 GLASSES WINE DAILY  . Drug use: No  . Sexual activity: Not on file  Other Topics Concern  . Not on file  Social History Narrative  . Not on file   Social Determinants of Health   Financial Resource Strain:   . Difficulty of Paying Living Expenses:   Food Insecurity:   . Worried About Charity fundraiser in the Last Year:   . Arboriculturist in the Last Year:   Transportation Needs:   . Film/video editor (Medical):   Marland Kitchen Lack of Transportation (Non-Medical):   Physical Activity:   . Days of Exercise per Week:   . Minutes of Exercise per Session:   Stress:   . Feeling of Stress :   Social Connections:   . Frequency of Communication with Friends and Family:   . Frequency of Social Gatherings with Friends and Family:   . Attends Religious Services:   . Active Member of Clubs or Organizations:   . Attends Archivist Meetings:   Marland Kitchen Marital Status:   Intimate Partner Violence:   . Fear of Current or Ex-Partner:   . Emotionally Abused:   Marland Kitchen Physically Abused:   . Sexually Abused:       Review of Systems  Musculoskeletal: Positive for back pain.  All other systems reviewed and are negative.      Objective:   Physical Exam  Constitutional: He is oriented to person, place, and time. He appears well-developed and well-nourished. No distress.  Neck: No tracheal deviation present. No thyromegaly present.  Cardiovascular: Normal rate, regular rhythm, normal heart sounds and intact distal pulses. Exam reveals no gallop and no friction rub.  No murmur heard. Pulmonary/Chest: Effort  normal and breath sounds normal. No respiratory distress. He has no wheezes. He has no rales. He exhibits no tenderness.  Abdominal: Soft. Bowel sounds are normal.  Musculoskeletal:     Left shoulder: No effusion, tenderness, bony tenderness, crepitus, pain or spasms. Normal range of motion. Normal strength.     Cervical back: Normal range of motion and neck supple.     Lumbar back: Pain present. No spasms, tenderness or bony tenderness. Decreased range of motion.  Back:  Neurological: He is alert and oriented to person, place, and time. He has normal reflexes.  Skin: He is not diaphoretic.  Vitals reviewed.         Assessment & Plan:   Left sided sciatica - Plan: DG Lumbar Spine Complete I believe the majority of his low back pain is muscular however he is also having symptoms of possible sciatica.  Therefore I recommended an x-ray of the lumbar spine to evaluate further.  Regarding his blood pressure I would add doxazosin 2 mg a day and recheck blood pressure in 1 month. Patient's exam is completely normal.  There is no evidence of fluid overload/congestive heart failure.  I do not believe the patient has a DVT.  There is no physical stigmata of kidney failure liver failure.  I believe this is most likely due to the amlodipine.  We discussed this at length, and the patient is willing to accept this level of swelling as the medication is controlling his blood pressure well.  Will make no changes at this time.

## 2019-05-08 DIAGNOSIS — L570 Actinic keratosis: Secondary | ICD-10-CM | POA: Diagnosis not present

## 2019-05-08 DIAGNOSIS — X32XXXD Exposure to sunlight, subsequent encounter: Secondary | ICD-10-CM | POA: Diagnosis not present

## 2019-05-08 DIAGNOSIS — L821 Other seborrheic keratosis: Secondary | ICD-10-CM | POA: Diagnosis not present

## 2019-05-08 DIAGNOSIS — Z1283 Encounter for screening for malignant neoplasm of skin: Secondary | ICD-10-CM | POA: Diagnosis not present

## 2019-05-13 ENCOUNTER — Other Ambulatory Visit: Payer: Self-pay

## 2019-05-13 ENCOUNTER — Telehealth: Payer: Self-pay

## 2019-05-13 ENCOUNTER — Ambulatory Visit: Payer: Medicare PPO

## 2019-05-13 VITALS — BP 110/62 | HR 59

## 2019-05-13 DIAGNOSIS — I10 Essential (primary) hypertension: Secondary | ICD-10-CM

## 2019-05-13 NOTE — Telephone Encounter (Signed)
Pt came in for bp check. Pt brought his machine, bp was 119/61 HR 61. I checked pt's bp, it was 110/62 HR 59.

## 2019-05-13 NOTE — Progress Notes (Signed)
Pt came in for bp check. Pt brought his machine, bp was 119/61 HR 61. I checked pt's bp, it was 110/62 HR 59.

## 2019-05-13 NOTE — Telephone Encounter (Signed)
Outstanding no changes at this time

## 2019-05-14 ENCOUNTER — Telehealth: Payer: Self-pay | Admitting: *Deleted

## 2019-05-14 DIAGNOSIS — H401131 Primary open-angle glaucoma, bilateral, mild stage: Secondary | ICD-10-CM | POA: Diagnosis not present

## 2019-05-14 NOTE — Telephone Encounter (Signed)
Received request from pharmacy for PA on Nexium.  PA submitted.   Dx:K21.9- GERD.  Received immediate determination: Available without authorization.

## 2019-05-15 ENCOUNTER — Other Ambulatory Visit: Payer: Self-pay | Admitting: Cardiology

## 2019-05-25 ENCOUNTER — Other Ambulatory Visit: Payer: Self-pay | Admitting: Family Medicine

## 2019-05-28 ENCOUNTER — Encounter: Payer: Self-pay | Admitting: Gastroenterology

## 2019-05-28 ENCOUNTER — Ambulatory Visit: Payer: Medicare PPO | Admitting: Gastroenterology

## 2019-05-28 ENCOUNTER — Other Ambulatory Visit: Payer: Self-pay | Admitting: Gastroenterology

## 2019-05-28 VITALS — BP 120/76 | HR 62 | Temp 98.7°F | Ht 67.0 in | Wt 172.0 lb

## 2019-05-28 DIAGNOSIS — K219 Gastro-esophageal reflux disease without esophagitis: Secondary | ICD-10-CM | POA: Diagnosis not present

## 2019-05-28 DIAGNOSIS — R195 Other fecal abnormalities: Secondary | ICD-10-CM

## 2019-05-28 MED ORDER — SUTAB 1479-225-188 MG PO TABS: 1.0000 | ORAL_TABLET | Freq: Once | ORAL | 0 refills | Status: AC

## 2019-05-28 MED ORDER — ESOMEPRAZOLE MAGNESIUM 40 MG PO CPDR: 40.0000 mg | DELAYED_RELEASE_CAPSULE | ORAL | 3 refills | Status: DC

## 2019-05-28 NOTE — Progress Notes (Signed)
HPI :  78 year old male with a history of CAD status post CABG June 2020, history of GERD, hypertension, referred by Jenna Luo, MD for positive Cologuard test.   He had a positive Cologuard on April 7.  He states his last colonoscopy was about 10 years ago, he thinks he may have had some polyps removed but is not sure.  Unclear where this exam was done.  No records available.  He states he takes fiber supplement daily for occasional constipation and this tends to work pretty well for him.  Most the time he is fairly regular.  Denies any blood in his stools.  He denies any abdominal pains on a routine basis.  No family history of colon cancer.  He has been vaccinated to COVID-19.  He takes Nexium 40 mg a day and has done so for a long time, at least 5 years, for symptoms of reflux.  He states this controls his symptoms pretty well.  He denies any dysphagia.  He had a barium swallow in 2015 which did not show any significant abnormalities.  He denies any family history of esophageal cancer.  He has a 15-year tobacco history.  He has never had an EGD for Barrett's screening.  He otherwise states he is functionally doing very well after his bypass surgery, he denies any cardiopulmonary symptoms at baseline.  He had an echocardiogram in May 2020 which showed a good ejection fraction.   Cologuard 04/24/19 - positive  Echo 06/13/18 - EF 60-65%  Past Medical History:  Diagnosis Date  . Abdominal distension    UMBILICAL HERNIA-NO PAIN  . Bleeding from the nose    SEVERAL EPISODES THAT REQUIRED TX IN ER--LAST EPISODE WAS APPROX 1 YR AGO  . Blood in urine    MICROSCOPIC BLOODEVALUATED BY DR. NESI FRIDAY 02/11/11--URINE SAMPLE AND EXAM-- AND BLOOD SAMPLE AND TO BE SEEN AGAIN IN 6 MONTHS AND GIVEN VESICARE  FOR FREQUENCY AND URGENCY  . Coronary artery disease   . GERD (gastroesophageal reflux disease)   . Hyperlipidemia   . Hypertension      Past Surgical History:  Procedure Laterality Date  .  CARDIAC CATHETERIZATION    . CORONARY ARTERY BYPASS GRAFT N/A 02/05/2018   Procedure: CORONARY ARTERY BYPASS GRAFTING (CABG) x4 USING BILATERAL IMA'S WITH LEFT GREATER SAPHENOUS VEIN HARVEST AND EXPLORATION OF RIGHT SAPHENOUS;  Surgeon: Gaye Pollack, MD;  Location: [facility];  Service: Open Heart Surgery;  Laterality: N/A;  . [place]  . LEFT HEART CATH AND CORONARY ANGIOGRAPHY N/A 02/02/2018   Procedure: LEFT HEART CATH AND CORONARY ANGIOGRAPHY;  Surgeon: Martinique, Peter M, MD;  Location: [facility];  Service: Cardiovascular;  Laterality: N/A;  . TEE WITHOUT CARDIOVERSION N/A 02/05/2018   Procedure: TRANSESOPHAGEAL ECHOCARDIOGRAM (TEE);  Surgeon: Gaye Pollack, MD;  Location: [employer] [facility];  Service: Open Heart Surgery;  Laterality: N/A;  . UMBILICAL HERNIA REPAIR  02/15/2011   Procedure: HERNIA REPAIR UMBILICAL ADULT;  Surgeon: Odis Hollingshead, MD;  Location: [facility];  Service: General;  Laterality: N/A;  umbilical hernia repair with mesh  . VASECTOMY  1976   Family History  Problem Relation Age of Onset  . Heart disease Mother   . Heart disease Father   . Cancer Sister        pancreatic  . Esophageal cancer Neg Hx   . Stomach cancer Neg Hx   . Pancreatic cancer Neg Hx    Social History  Tobacco Use  . Smoking status: Former Smoker    Years: 10.00    Quit date: 02/22/1992    Years since quitting: 27.2  . Smokeless tobacco: Never Used  . Tobacco comment: QUIT SMOKING ABOUT 1994 - 1 PP WEEK  Substance Use Topics  . Alcohol use: Yes    Comment: 2 GLASSES WINE DAILY  . Drug use: No   Current Outpatient Medications  Medication Sig Dispense Refill  . aspirin EC 81 MG tablet Take 81 mg by mouth daily.    Marland Kitchen atorvastatin (LIPITOR) 80 MG tablet TAKE 1 TABLET BY MOUTH DAILY AT 6 PM 90 tablet 1  . Cyanocobalamin (VITAMIN B-12 PO) Take 1 tablet by mouth daily.    Marland Kitchen esomeprazole (NEXIUM) 40 MG capsule Take 1 capsule (40 mg total) by mouth daily at 12 noon. 90 capsule 3  .  hydrochlorothiazide (HYDRODIURIL) 25 MG tablet Take 1 tablet (25 mg total) by mouth daily. 90 tablet 3  . latanoprost (XALATAN) 0.005 % ophthalmic solution Place 1 drop into both eyes at bedtime.   0  . losartan (COZAAR) 100 MG tablet Take 1 tablet (100 mg total) by mouth daily. 90 tablet 3  . metoprolol succinate (TOPROL-XL) 50 MG 24 hr tablet TAKE 1 TABLET BY MOUTH EVERY DAY WITH OR IMMEDIATELY FOLLOWING A MEAL 90 tablet 2  . Multiple Vitamin (MULTIVITAMIN) tablet Take 1 tablet by mouth daily.    . solifenacin (VESICARE) 5 MG tablet Take 5 mg by mouth daily.    . timolol (TIMOPTIC) 0.5 % ophthalmic solution Place 1 drop into both eyes daily.     No current facility-administered medications for this visit.   Allergies  Allergen Reactions  . Amlodipine Swelling    swelling in ankles  . Shellfish Allergy Other (See Comments)    N&V AFTER EATING MUSSELS     Review of Systems: All systems reviewed and negative except where noted in HPI.    DG Lumbar Spine Complete  Result Date: 05/05/2019 CLINICAL DATA:  Low back pain radiating down left leg EXAM: LUMBAR SPINE - COMPLETE 4+ VIEW COMPARISON:  None. FINDINGS: Advanced degenerative facet disease throughout the lumbar spine, most pronounced in the lower lumbar spine. 7 mm of anterolisthesis of L4 on L5. Degenerative spurring in the lower thoracic spine. Disc space narrowing at L5-S1 and L1-2 with early vacuum disc. Slight wedged appearance of the T12 vertebral body could reflect mild old compression fracture, stable since chest x-ray from 03/05/2018 IMPRESSION: Degenerative disc and facet disease.  No acute bony abnormality. Electronically Signed   By: Rolm Baptise M.D.   On: 05/05/2019 20:04   Lab Results  Component Value Date   WBC 5.5 04/16/2019   HGB 14.3 04/16/2019   HCT 43.2 04/16/2019   MCV 97.3 04/16/2019   PLT 190 04/16/2019    Lab Results  Component Value Date   CREATININE 1.11 04/16/2019   BUN 29 (H) 04/16/2019   NA 142  04/16/2019   K 5.2 04/16/2019   CL 106 04/16/2019   CO2 30 04/16/2019    Lab Results  Component Value Date   ALT 21 04/16/2019   AST 23 04/16/2019   ALKPHOS 87 06/20/2018   BILITOT 0.6 04/16/2019      Physical Exam: BP 120/76   Pulse 62   Temp 98.7 F (37.1 C)   Ht 5\' 7"  (1.702 m)   Wt 172 lb (78 kg)   BMI 26.94 kg/m  Constitutional: Pleasant,well-developed, male in no acute distress.  HEENT: Normocephalic and atraumatic. Conjunctivae are normal. No scleral icterus. Neck supple.  Cardiovascular: Normal rate, regular rhythm.  Pulmonary/chest: Effort normal and breath sounds normal. No wheezing, rales or rhonchi. Abdominal: Soft, nondistended, nontender. There are no masses palpable.  Extremities: no edema Lymphadenopathy: No cervical adenopathy noted. Neurological: Alert and oriented to person place and time. Skin: Skin is warm and dry. No rashes noted. Psychiatric: Normal mood and affect. Behavior is normal.   ASSESSMENT AND PLAN: 79 year old male here for new patient assessment of the following:  Positive Cologuard - he is asymptomatic and does not have any alarm symptoms, no anemia.  I discussed what to Cologuard test is and implications of a positive test.  I am recommending a colonoscopy to further evaluate this.  I discussed risks and benefits of colonoscopy and anesthesia and he wanted to proceed.  Further recommendations pending the results.  GERD - longstanding reflux that is well controlled with Nexium 40 mg a day.  I discussed reflux with him and treatment options.  I specifically discussed long-term risks of PPI use to include increased risk for CKD, bone fracture, etc.  Recommend using the lowest dose of Nexium needed to control symptoms or use of an alternative.  We discussed options, he will try weaning Nexium to 40 mg every other day, and if he does okay with this may transition to a 20 mg tablet.  If however he finds he needs to continue use it daily to  maintain symptoms that is okay, he can let me know.  Otherwise given his age, ethnicity, gender, tobacco history, longstanding reflux, he meets criteria for screening for Barrett's esophagus.  I discussed what EGD entails and if he wanted to pursue that, I offered this to him to be done at the same time as his colonoscopy.  He thinks he does but he wants to think about it and discuss a bit further with his primary care.  He wanted to tentatively be scheduled for EGD and colonoscopy today, he will cancel the upper endoscopy if he finds he does not want to proceed with it, will let us know   Cellar, MD [facility]  CC: Susy Frizzle, MD

## 2019-05-28 NOTE — Patient Instructions (Addendum)
If you are age 79 or older, your body mass index should be between 23-30. Your Body mass index is 26.94 kg/m. If this is out of the aforementioned range listed, please consider follow up with your Primary Care Provider.  If you are age 61 or younger, your body mass index should be between 19-25. Your Body mass index is 26.94 kg/m. If this is out of the aformentioned range listed, please consider follow up with your Primary Care Provider.   You have been scheduled for a colonoscopy. Please follow written instructions given to you at your visit today.  Please pick up your prep supplies at the pharmacy within the next 1-3 days. If you use inhalers (even only as needed), please bring them with you on the day of your procedure.  Take Nexium 40 mg every OTHER day.  Please give the pharmacist the [place] we are providing you today if your bowel prep is over $40.  They need to [place] in order to get the reduced price.   Thank you for entrusting me with your care and for choosing [facility], Dr. [place] Cellar

## 2019-06-04 ENCOUNTER — Ambulatory Visit: Payer: Medicare Other | Admitting: Gastroenterology

## 2019-06-04 ENCOUNTER — Encounter: Payer: Self-pay | Admitting: Gastroenterology

## 2019-06-10 ENCOUNTER — Other Ambulatory Visit: Payer: Self-pay

## 2019-06-10 ENCOUNTER — Encounter: Payer: Self-pay | Admitting: Gastroenterology

## 2019-06-10 ENCOUNTER — Ambulatory Visit (AMBULATORY_SURGERY_CENTER): Payer: Medicare PPO | Admitting: Gastroenterology

## 2019-06-10 VITALS — BP 108/45 | HR 52 | Temp 96.9°F | Resp 16

## 2019-06-10 DIAGNOSIS — D122 Benign neoplasm of ascending colon: Secondary | ICD-10-CM | POA: Diagnosis not present

## 2019-06-10 DIAGNOSIS — K219 Gastro-esophageal reflux disease without esophagitis: Secondary | ICD-10-CM | POA: Diagnosis not present

## 2019-06-10 DIAGNOSIS — K2970 Gastritis, unspecified, without bleeding: Secondary | ICD-10-CM | POA: Diagnosis not present

## 2019-06-10 DIAGNOSIS — R195 Other fecal abnormalities: Secondary | ICD-10-CM

## 2019-06-10 DIAGNOSIS — K5730 Diverticulosis of large intestine without perforation or abscess without bleeding: Secondary | ICD-10-CM | POA: Diagnosis not present

## 2019-06-10 DIAGNOSIS — K2101 Gastro-esophageal reflux disease with esophagitis, with bleeding: Secondary | ICD-10-CM | POA: Diagnosis not present

## 2019-06-10 DIAGNOSIS — D123 Benign neoplasm of transverse colon: Secondary | ICD-10-CM

## 2019-06-10 DIAGNOSIS — K635 Polyp of colon: Secondary | ICD-10-CM

## 2019-06-10 DIAGNOSIS — K317 Polyp of stomach and duodenum: Secondary | ICD-10-CM

## 2019-06-10 DIAGNOSIS — K449 Diaphragmatic hernia without obstruction or gangrene: Secondary | ICD-10-CM | POA: Diagnosis not present

## 2019-06-10 MED ORDER — SODIUM CHLORIDE 0.9 % IV SOLN
500.0000 mL | Freq: Once | INTRAVENOUS | Status: DC
Start: 2019-06-10 — End: 2019-06-10

## 2019-06-10 NOTE — Op Note (Signed)
[facility] Patient Name: Alex Martin Procedure Date: 06/10/2019 9:36 AM MRN: [id number] Endoscopist: Remo Lipps P. Havery Moros , MD Age: 79 Referring MD:  Date of Birth: 1940-04-03 Gender: Male Account #: [id number] Procedure:                Colonoscopy Indications:              Positive Cologuard test Medicines:                Monitored Anesthesia Care Procedure:                Pre-Anesthesia Assessment:                           - Prior to the procedure, a History and Physical                            was performed, and patient medications and                            allergies were reviewed. The patient's tolerance of                            previous anesthesia was also reviewed. The risks                            and benefits of the procedure and the sedation                            options and risks were discussed with the patient.                            All questions were answered, and informed consent                            was obtained. Prior Anticoagulants: The patient has                            taken no previous anticoagulant or antiplatelet                            agents. ASA Grade Assessment: III - A patient with                            severe systemic disease. After reviewing the risks                            and benefits, the patient was deemed in                            satisfactory condition to undergo the procedure.                           After obtaining informed consent, the colonoscope  was passed under direct vision. Throughout the                            procedure, the patient's blood pressure, pulse, and                            oxygen saturations were monitored continuously. The                            Colonoscope was introduced through the anus and                            advanced to the the terminal ileum, with                            identification of the appendiceal  orifice and IC                            valve. The colonoscopy was performed without                            difficulty. The patient tolerated the procedure                            well. The quality of the bowel preparation was                            adequate. The terminal ileum, ileocecal valve,                            appendiceal orifice, and rectum were photographed. Scope In: 9:50:30 AM Scope Out: 10:19:42 AM Scope Withdrawal Time: 0 hours 24 minutes 30 seconds  Total Procedure Duration: 0 hours 29 minutes 12 seconds  Findings:                 The perianal and digital rectal examinations were                            normal.                           The terminal ileum appeared normal.                           Four sessile polyps were found in the ascending                            colon. The polyps were 3 to 4 mm in size. These                            polyps were removed with a cold snare. Resection                            and retrieval were complete.  A few small-mouthed diverticula were found in the                            ascending colon.                           A 3 mm polyp was found in the hepatic flexure. The                            polyp was sessile. The polyp was removed with a                            cold snare. Resection and retrieval were complete.                           A 4 mm polyp was found in the transverse colon. The                            polyp was sessile. The polyp was removed with a                            cold snare. Resection and retrieval were complete.                           The colon was tortuous.                           The exam was otherwise without abnormality. Complications:            No immediate complications. Estimated blood loss:                            Minimal. Estimated Blood Loss:     Estimated blood loss was minimal. Impression:               - The examined portion  of the ileum was normal.                           - Four 3 to 4 mm polyps in the ascending colon,                            removed with a cold snare. Resected and retrieved.                           - Diverticulosis in the ascending colon.                           - One 3 mm polyp at the hepatic flexure, removed                            with a cold snare. Resected and retrieved.                           - One 4 mm  polyp in the transverse colon, removed                            with a cold snare. Resected and retrieved.                           - Tortuous colon.                           - The examination was otherwise normal. Recommendation:           - Patient has a contact number available for                            emergencies. The signs and symptoms of potential                            delayed complications were discussed with the                            patient. Return to normal activities tomorrow.                            Written discharge instructions were provided to the                            patient.                           - Resume previous diet.                           - Continue present medications.                           - Await pathology results. Remo Lipps P. Rosland Riding, MD 06/10/2019 10:24:01 AM This report has been signed electronically.

## 2019-06-10 NOTE — Progress Notes (Signed)
Called to room to assist during endoscopic procedure.  Patient ID and intended procedure confirmed with present staff. Received instructions for my participation in the procedure from the performing physician.  

## 2019-06-10 NOTE — Op Note (Signed)
[place] Patient Name: Alex Martin Procedure Date: 06/10/2019 9:32 AM MRN: [id number] Endoscopist: Remo Lipps P. Havery Moros , MD Age: 79 Referring MD:  Date of Birth: 10/25/1940 Gender: Male Account #: [id number] Procedure:                Upper GI endoscopy Indications:              Screening for Barrett's esophagus, on nexium 40mg  /                            day - longstanding reflux Medicines:                Monitored Anesthesia Care Procedure:                Pre-Anesthesia Assessment:                           - Prior to the procedure, a History and Physical                            was performed, and patient medications and                            allergies were reviewed. The patient's tolerance of                            previous anesthesia was also reviewed. The risks                            and benefits of the procedure and the sedation                            options and risks were discussed with the patient.                            All questions were answered, and informed consent                            was obtained. Prior Anticoagulants: The patient has                            taken no previous anticoagulant or antiplatelet                            agents. ASA Grade Assessment: III - A patient with                            severe systemic disease. After reviewing the risks                            and benefits, the patient was deemed in                            satisfactory condition to undergo the procedure.  After obtaining informed consent, the endoscope was                            passed under direct vision. Throughout the                            procedure, the patient's blood pressure, pulse, and                            oxygen saturations were monitored continuously. The                            Endoscope was introduced through the mouth, and                            advanced to the  second part of duodenum. The upper                            GI endoscopy was accomplished without difficulty.                            The patient tolerated the procedure well. Scope In: Scope Out: Findings:                 Esophagogastric landmarks were identified: the                            Z-line was found at 36 cm, the gastroesophageal                            junction was found at 38 cm and the upper extent of                            the gastric folds was found at 41 cm from the                            incisors.                           There were esophageal mucosal changes suggestive of                            Barrett's stage C2-M2 (1.5 to 2cm in length) per                            Prague criteria present in the lower third of the                            esophagus. The maximum longitudinal extent of these                            mucosal changes was 1.5 to 2 cm in length. No  nodularity appreciated. Biopsies were taken with a                            cold forceps for histology.                           A 3 cm hiatal hernia was present.                           The exam of the esophagus was otherwise normal.                           A few small sessile polyps were found in the                            gastric body and in the gastric antrum. Biopsies                            were taken with a cold forceps for histology from a                            few of them as representative samples.                           Patchy mild inflammation characterized by erythema                            was found in the gastric body and in the gastric                            antrum. Biopsies were taken with a cold forceps for                            histology.                           The exam of the stomach was otherwise normal.                           The duodenal bulb and second portion of the                             duodenum were normal. Complications:            No immediate complications. Estimated blood loss:                            Minimal. Estimated Blood Loss:     Estimated blood loss was minimal. Impression:               - Esophagogastric landmarks identified.                           - Esophageal mucosal changes classified as  Barrett's stage C2-M2 per Prague criteria. Biopsied.                           - 3 cm hiatal hernia.                           - A few gastric polyps. Biopsied.                           - Gastritis. Biopsied.                           - Normal duodenal bulb and second portion of the                            duodenum. Recommendation:           - Patient has a contact number available for                            emergencies. The signs and symptoms of potential                            delayed complications were discussed with the                            patient. Return to normal activities tomorrow.                            Written discharge instructions were provided to the                            patient.                           - Resume previous diet.                           - Continue present medications.                           - Await pathology results. Remo Lipps P. Pairlee Sawtell, MD 06/10/2019 10:30:51 AM This report has been signed electronically.

## 2019-06-10 NOTE — Progress Notes (Signed)
A/ox3, pleased with MAC, report to RN 

## 2019-06-10 NOTE — Progress Notes (Signed)
Pt's states no medical or surgical changes since previsit or office visit. 

## 2019-06-10 NOTE — Patient Instructions (Addendum)
Handouts provided on gastritis, hiatal hernia, polyps and diverticulosis.    Increase Nexium to daily at current dose of 40mg .   YOU HAD AN ENDOSCOPIC PROCEDURE TODAY AT [place]:   Refer to the procedure report that was given to you for any specific questions about what was found during the examination.  If the procedure report does not answer your questions, please call your gastroenterologist to clarify.  If you requested that your care partner not be given the details of your procedure findings, then the procedure report has been included in a sealed envelope for you to review at your convenience later.  YOU SHOULD EXPECT: Some feelings of bloating in the abdomen. Passage of more gas than usual.  Walking can help get rid of the air that was put into your GI tract during the procedure and reduce the bloating. If you had a lower endoscopy (such as a colonoscopy or flexible sigmoidoscopy) you may notice spotting of blood in your stool or on the toilet paper. If you underwent a bowel prep for your procedure, you may not have a normal bowel movement for a few days.  Please Note:  You might notice some irritation and congestion in your nose or some drainage.  This is from the oxygen used during your procedure.  There is no need for concern and it should clear up in a day or so.  SYMPTOMS TO REPORT IMMEDIATELY:   Following lower endoscopy (colonoscopy or flexible sigmoidoscopy):  Excessive amounts of blood in the stool  Significant tenderness or worsening of abdominal pains  Swelling of the abdomen that is new, acute  Fever of 100F or higher   Following upper endoscopy (EGD)  Vomiting of blood or coffee ground material  New chest pain or pain under the shoulder blades  Painful or persistently difficult swallowing  New shortness of breath  Fever of 100F or higher  Black, tarry-looking stools  For urgent or emergent issues, a gastroenterologist can be reached at any hour  by calling [phone]. Do not use MyChart messaging for urgent concerns.    DIET:  We do recommend a small meal at first, but then you may proceed to your regular diet.  Drink plenty of fluids but you should avoid alcoholic beverages for 24 hours.  ACTIVITY:  You should plan to take it easy for the rest of today and you should NOT DRIVE or use heavy machinery until tomorrow (because of the sedation medicines used during the test).    FOLLOW UP: Our staff will call the number listed on your records 48-72 hours following your procedure to check on you and address any questions or concerns that you may have regarding the information given to you following your procedure. If we do not reach you, we will leave a message.  We will attempt to reach you two times.  During this call, we will ask if you have developed any symptoms of COVID 19. If you develop any symptoms (ie: fever, flu-like symptoms, shortness of breath, cough etc.) before then, please call [phone].  If you test positive for Covid 19 in the 2 weeks post procedure, please call and report this information to [place].    If any biopsies were taken you will be contacted by phone or by letter within the next 1-3 weeks.  Please call us at [phone] if you have not heard about the biopsies in 3 weeks.    SIGNATURES/CONFIDENTIALITY: You and/or your care partner have signed  paperwork which will be entered into your electronic medical record.  These signatures attest to the fact that that the information above on your After Visit Summary has been reviewed and is understood.  Full responsibility of the confidentiality of this discharge information lies with you and/or your care-partner.

## 2019-06-12 ENCOUNTER — Telehealth: Payer: Self-pay

## 2019-06-12 NOTE — Telephone Encounter (Signed)
  Follow up Call-  Call back number 06/10/2019  Post procedure Call Back phone  # [phone]  Permission to leave phone message Yes  Some recent data might be hidden     Patient questions:  Do you have a fever, pain , or abdominal swelling? No. Pain Score  0 *  Have you tolerated food without any problems? Yes.    Have you been able to return to your normal activities? Yes.    Do you have any questions about your discharge instructions: Diet   No. Medications  No. Follow up visit  No.  Do you have questions or concerns about your Care? No.  Actions: * If pain score is 4 or above: 1. No action needed, pain <4.Have you developed a fever since your procedure? no  2.   Have you had an respiratory symptoms (SOB or cough) since your procedure? no  3.   Have you tested positive for COVID 19 since your procedure no  4.   Have you had any family members/close contacts diagnosed with the COVID 19 since your procedure?  no   If yes to any of these questions please route to Joylene John, RN and Erenest Rasher, RN

## 2019-06-12 NOTE — Telephone Encounter (Signed)
Left message on follow up call. 

## 2019-09-25 DIAGNOSIS — C44319 Basal cell carcinoma of skin of other parts of face: Secondary | ICD-10-CM | POA: Diagnosis not present

## 2019-09-26 DIAGNOSIS — Z7722 Contact with and (suspected) exposure to environmental tobacco smoke (acute) (chronic): Secondary | ICD-10-CM | POA: Diagnosis not present

## 2019-09-26 DIAGNOSIS — Z85828 Personal history of other malignant neoplasm of skin: Secondary | ICD-10-CM | POA: Diagnosis not present

## 2019-09-26 DIAGNOSIS — I252 Old myocardial infarction: Secondary | ICD-10-CM | POA: Diagnosis not present

## 2019-09-26 DIAGNOSIS — I2510 Atherosclerotic heart disease of native coronary artery without angina pectoris: Secondary | ICD-10-CM | POA: Diagnosis not present

## 2019-09-26 DIAGNOSIS — K219 Gastro-esophageal reflux disease without esophagitis: Secondary | ICD-10-CM | POA: Diagnosis not present

## 2019-09-26 DIAGNOSIS — R609 Edema, unspecified: Secondary | ICD-10-CM | POA: Diagnosis not present

## 2019-09-26 DIAGNOSIS — E785 Hyperlipidemia, unspecified: Secondary | ICD-10-CM | POA: Diagnosis not present

## 2019-09-26 DIAGNOSIS — R32 Unspecified urinary incontinence: Secondary | ICD-10-CM | POA: Diagnosis not present

## 2019-09-26 DIAGNOSIS — H409 Unspecified glaucoma: Secondary | ICD-10-CM | POA: Diagnosis not present

## 2019-09-26 DIAGNOSIS — Z9181 History of falling: Secondary | ICD-10-CM | POA: Diagnosis not present

## 2019-09-26 DIAGNOSIS — Z87891 Personal history of nicotine dependence: Secondary | ICD-10-CM | POA: Diagnosis not present

## 2019-09-26 DIAGNOSIS — I10 Essential (primary) hypertension: Secondary | ICD-10-CM | POA: Diagnosis not present

## 2019-10-14 ENCOUNTER — Other Ambulatory Visit: Payer: Medicare PPO

## 2019-10-14 ENCOUNTER — Other Ambulatory Visit: Payer: Self-pay

## 2019-10-14 DIAGNOSIS — Z20822 Contact with and (suspected) exposure to covid-19: Secondary | ICD-10-CM

## 2019-10-15 LAB — SARS-COV-2, NAA 2 DAY TAT

## 2019-10-15 LAB — NOVEL CORONAVIRUS, NAA: SARS-CoV-2, NAA: NOT DETECTED

## 2019-10-29 DIAGNOSIS — X32XXXD Exposure to sunlight, subsequent encounter: Secondary | ICD-10-CM | POA: Diagnosis not present

## 2019-10-29 DIAGNOSIS — Z08 Encounter for follow-up examination after completed treatment for malignant neoplasm: Secondary | ICD-10-CM | POA: Diagnosis not present

## 2019-10-29 DIAGNOSIS — Z85828 Personal history of other malignant neoplasm of skin: Secondary | ICD-10-CM | POA: Diagnosis not present

## 2019-10-29 DIAGNOSIS — L570 Actinic keratosis: Secondary | ICD-10-CM | POA: Diagnosis not present

## 2019-10-29 DIAGNOSIS — C44519 Basal cell carcinoma of skin of other part of trunk: Secondary | ICD-10-CM | POA: Diagnosis not present

## 2019-11-06 ENCOUNTER — Other Ambulatory Visit: Payer: Self-pay | Admitting: Cardiology

## 2019-11-15 DIAGNOSIS — H401131 Primary open-angle glaucoma, bilateral, mild stage: Secondary | ICD-10-CM | POA: Diagnosis not present

## 2019-11-15 DIAGNOSIS — H2513 Age-related nuclear cataract, bilateral: Secondary | ICD-10-CM | POA: Diagnosis not present

## 2019-11-15 DIAGNOSIS — H43813 Vitreous degeneration, bilateral: Secondary | ICD-10-CM | POA: Diagnosis not present

## 2019-11-25 ENCOUNTER — Other Ambulatory Visit: Payer: Self-pay | Admitting: Cardiology

## 2019-11-28 ENCOUNTER — Encounter: Payer: Self-pay | Admitting: Cardiology

## 2019-11-28 ENCOUNTER — Other Ambulatory Visit: Payer: Self-pay

## 2019-11-28 ENCOUNTER — Ambulatory Visit: Payer: Medicare PPO | Admitting: Cardiology

## 2019-11-28 VITALS — BP 110/60 | HR 50 | Ht 67.0 in | Wt 173.0 lb

## 2019-11-28 DIAGNOSIS — E785 Hyperlipidemia, unspecified: Secondary | ICD-10-CM | POA: Diagnosis not present

## 2019-11-28 DIAGNOSIS — I10 Essential (primary) hypertension: Secondary | ICD-10-CM | POA: Diagnosis not present

## 2019-11-28 DIAGNOSIS — Z951 Presence of aortocoronary bypass graft: Secondary | ICD-10-CM | POA: Diagnosis not present

## 2019-11-28 DIAGNOSIS — I2510 Atherosclerotic heart disease of native coronary artery without angina pectoris: Secondary | ICD-10-CM | POA: Diagnosis not present

## 2019-11-28 MED ORDER — METOPROLOL SUCCINATE ER 25 MG PO TB24: 25.0000 mg | ORAL_TABLET | Freq: Every day | ORAL | 3 refills | Status: DC

## 2019-11-28 MED ORDER — LOSARTAN POTASSIUM 100 MG PO TABS
100.0000 mg | ORAL_TABLET | Freq: Every day | ORAL | 3 refills | Status: DC
Start: 2019-11-28 — End: 2020-12-18

## 2019-11-28 NOTE — Patient Instructions (Signed)
Medication Instructions:  Please decrease Metoprolol Succinate to 25 mg a day.  Continue all other medications as listed.  *If you need a refill on your cardiac medications before your next appointment, please call your pharmacy*  Follow-Up: At [facility], you and your health needs are our priority.  As part of our continuing mission to provide you with exceptional heart care, we have created designated Provider Care Teams.  These Care Teams include your primary Cardiologist (physician) and Advanced Practice Providers (APPs -  Physician Assistants and Nurse Practitioners) who all work together to provide you with the care you need, when you need it.  We recommend signing up for the patient portal called "MyChart".  Sign up information is provided on this After Visit Summary.  MyChart is used to connect with patients for Virtual Visits (Telemedicine).  Patients are able to view lab/test results, encounter notes, upcoming appointments, etc.  Non-urgent messages can be sent to your provider as well.   To learn more about what you can do with MyChart, go to [email or web address].    Your next appointment:   12 month(s)  The format for your next appointment:   In Person  Provider:   Candee Furbish, MD   Thank you for choosing [facility]!!

## 2019-11-28 NOTE — Addendum Note (Signed)
Addended by: Shellia Cleverly on: 11/28/2019 11:00 AM   Modules accepted: Orders

## 2019-11-28 NOTE — Progress Notes (Addendum)
Cardiology Office Note:    Date:  11/28/2019   ID:  Alex Martin, DOB 1940-11-10, MRN [id number]  PCP:  Alex Frizzle, MD  [facility] Cardiologist:  Alex Furbish, MD  [facility] Electrophysiologist:  None   Referring MD: Alex Frizzle, MD     History of Present Illness:    Alex Martin is a 79 y.o. male here for the follow-up of CAD post CABG 02/05/2018.  Previously admitted with non-ST elevation myocardial infarction with severe substernal chest discomfort to his back bilateral arms intense.  ST depressions were noted in V5 and V6.  Troponin was elevated.  CABG on 02/05/2018 x4 LIMA to LAD free RIMA to OM, sequential SVG to PDA and PL  Brief postop atrial fibrillation.  Had some side effects with amiodarone and this was discontinued.  EF reduced from 30 to 35% up to 60%.  Kayaking.  No CP.  No fevers chills nausea vomiting syncope bleeding.  Doing very well.  Tolerating his medications well.  Past Medical History:  Diagnosis Date  . Abdominal distension    UMBILICAL HERNIA-NO PAIN  . Bleeding from the nose    SEVERAL EPISODES THAT REQUIRED TX IN ER--LAST EPISODE WAS APPROX 1 YR AGO  . Blood in urine    MICROSCOPIC BLOODEVALUATED BY DR. NESI FRIDAY 02/11/11--URINE SAMPLE AND EXAM-- AND BLOOD SAMPLE AND TO BE SEEN AGAIN IN 6 MONTHS AND GIVEN VESICARE  FOR FREQUENCY AND URGENCY  . Coronary artery disease   . GERD (gastroesophageal reflux disease)   . Hyperlipidemia   . Hypertension     Past Surgical History:  Procedure Laterality Date  . CARDIAC CATHETERIZATION    . CORONARY ARTERY BYPASS GRAFT N/A 02/05/2018   Procedure: CORONARY ARTERY BYPASS GRAFTING (CABG) x4 USING BILATERAL IMA'S WITH LEFT GREATER SAPHENOUS VEIN HARVEST AND EXPLORATION OF RIGHT SAPHENOUS;  Surgeon: Gaye Pollack, MD;  Location: [facility];  Service: Open Heart Surgery;  Laterality: N/A;  . [place]  . LEFT HEART CATH AND CORONARY ANGIOGRAPHY N/A 02/02/2018    Procedure: LEFT HEART CATH AND CORONARY ANGIOGRAPHY;  Surgeon: Martinique, Peter M, MD;  Location: [place] CV LAB;  Service: Cardiovascular;  Laterality: N/A;  . TEE WITHOUT CARDIOVERSION N/A 02/05/2018   Procedure: TRANSESOPHAGEAL ECHOCARDIOGRAM (TEE);  Surgeon: Gaye Pollack, MD;  Location: [place];  Service: Open Heart Surgery;  Laterality: N/A;  . UMBILICAL HERNIA REPAIR  02/15/2011   Procedure: HERNIA REPAIR UMBILICAL ADULT;  Surgeon: Odis Hollingshead, MD;  Location: [facility];  Service: General;  Laterality: N/A;  umbilical hernia repair with mesh  . VASECTOMY  1976    Current Medications: Current Meds  Medication Sig  . aspirin EC 81 MG tablet Take 81 mg by mouth daily.  Marland Kitchen atorvastatin (LIPITOR) 80 MG tablet TAKE 1 TABLET BY MOUTH DAILY AT 6 PM  . Cyanocobalamin (VITAMIN B-12 PO) Take 1 tablet by mouth daily.  Marland Kitchen doxazosin (CARDURA) 2 MG tablet Take 2 mg by mouth daily.  Marland Kitchen esomeprazole (NEXIUM) 40 MG capsule Take 1 capsule (40 mg total) by mouth every other day.  . hydrochlorothiazide (HYDRODIURIL) 25 MG tablet Take 1 tablet (25 mg total) by mouth daily.  Marland Kitchen latanoprost (XALATAN) 0.005 % ophthalmic solution Place 1 drop into both eyes at bedtime.   Marland Kitchen losartan (COZAAR) 100 MG tablet Take 1 tablet (100 mg total) by mouth daily. Please keep upcoming appointment for future refills. Thank you  . Multiple Vitamin (  MULTIVITAMIN) tablet Take 1 tablet by mouth daily.  . solifenacin (VESICARE) 5 MG tablet Take 5 mg by mouth daily.  . timolol (TIMOPTIC) 0.5 % ophthalmic solution Place 1 drop into both eyes daily.  . [DISCONTINUED] metoprolol succinate (TOPROL-XL) 50 MG 24 hr tablet TAKE 1 TABLET BY MOUTH EVERY DAY WITH OR IMMEDIATELY FOLLOWING A MEAL     Allergies:   Amlodipine and Shellfish allergy   Social History   Socioeconomic History  . Marital status: Divorced    Spouse name: Not on file  . Number of children: Not on file  . Years of education: Not on file  . Highest education level:  Bachelor's degree (e.g., BA, AB, BS)  Occupational History  . Not on file  Tobacco Use  . Smoking status: Former Smoker    Years: 10.00    Quit date: 02/22/1992    Years since quitting: 27.7  . Smokeless tobacco: Never Used  . Tobacco comment: QUIT SMOKING ABOUT 1994 - 1 PP WEEK  Vaping Use  . Vaping Use: Never used  Substance and Sexual Activity  . Alcohol use: Yes    Comment: 2 GLASSES WINE DAILY  . Drug use: No  . Sexual activity: Not on file  Other Topics Concern  . Not on file  Social History Narrative  . Not on file   Social Determinants of Health   Financial Resource Strain:   . Difficulty of Paying Living Expenses: Not on file  Food Insecurity:   . Worried About Charity fundraiser in the Last Year: Not on file  . Ran Out of Food in the Last Year: Not on file  Transportation Needs:   . Lack of Transportation (Medical): Not on file  . Lack of Transportation (Non-Medical): Not on file  Physical Activity:   . Days of Exercise per Week: Not on file  . Minutes of Exercise per Session: Not on file  Stress:   . Feeling of Stress : Not on file  Social Connections:   . Frequency of Communication with Friends and Family: Not on file  . Frequency of Social Gatherings with Friends and Family: Not on file  . Attends Religious Services: Not on file  . Active Member of Clubs or Organizations: Not on file  . Attends Archivist Meetings: Not on file  . Marital Status: Not on file     Family History: The patient's family history includes Cancer in his sister; Heart disease in his father and mother. There is no history of Esophageal cancer, Stomach cancer, or Pancreatic cancer.  ROS:   Please see the history of present illness.     All other systems reviewed and are negative.  EKGs/Labs/Other Studies Reviewed:      EKG:  EKG is  ordered today.  The ekg ordered today demonstrates sinus bradycardia 50 with no other abnormalities.  PR interval 214 ms. First-degree  AV block.  Recent Labs: 04/16/2019: ALT 21; BUN 29; Creat 1.11; Hemoglobin 14.3; Platelets 190; Potassium 5.2; Sodium 142  Recent Lipid Panel    Component Value Date/Time   CHOL 181 04/16/2019 0855   CHOL 165 06/20/2018 0746   TRIG 199 (H) 04/16/2019 0855   HDL 51 04/16/2019 0855   HDL 63 06/20/2018 0746   CHOLHDL 3.5 04/16/2019 0855   VLDL 25 02/03/2018 0500   LDLCALC 98 04/16/2019 0855     Risk Assessment/Calculations:       Physical Exam:    VS:  BP 110/60  Pulse (!) 50   Ht 5\' 7"  (1.702 m)   Wt 173 lb (78.5 kg)   SpO2 96%   BMI 27.10 kg/m     Wt Readings from Last 3 Encounters:  11/28/19 173 lb (78.5 kg)  05/28/19 172 lb (78 kg)  05/03/19 170 lb (77.1 kg)     GEN:  Well nourished, well developed in no acute distress HEENT: Normal NECK: No JVD; No carotid bruits LYMPHATICS: No lymphadenopathy CARDIAC: RRR, no murmurs, rubs, gallops RESPIRATORY:  Clear to auscultation without rales, wheezing or rhonchi  ABDOMEN: Soft, non-tender, non-distended MUSCULOSKELETAL:  No edema; No deformity  SKIN: Warm and dry NEUROLOGIC:  Alert and oriented x 3 PSYCHIATRIC:  Normal affect   ASSESSMENT:    1. Coronary artery disease involving native coronary artery of native heart without angina pectoris   2. S/P CABG (coronary artery bypass graft)   3. Essential hypertension   4. Hyperlipidemia, unspecified hyperlipidemia type    PLAN:    In order of problems listed above:  CAD post CABG -January 2020 in the setting of myocardial infarction.  Had ischemic cardiomyopathy at the time but this has improved.  Did virtual cardiac rehab. -Back on aspirin only.  Completed Plavix for 1 year. -Continue with risk factor modification.  First-degree AV block, sinus bradycardia -We will decrease the Toprol from 50 down to 25.  He is not having any symptoms with this.  PR interval is minimally increased.  Non-ST elevation myocardial infarction -Aspirin.  High intensity  statin.  Hyperlipidemia -LDL goal less than 70.  High intensity statin.  Last LDL on 04/16/2019 was 98. -Could be a candidate for the Orion 4 trial (inclisarin).  He will talk with our clinic nurse coordinator. -If he decides not to go through with the trial, we will change him over to Crestor 40 mg daily and repeat lipid panel in 3 months.  ALT last was 21.  Creatinine 1.1 hemoglobin 14.3  Aortic regurgitation -Mild diastolic murmur heard on exam.  Continue to monitor clinically.   Shared Decision Making/Informed Consent      Medication Adjustments/Labs and Tests Ordered: Current medicines are reviewed at length with the patient today.  Concerns regarding medicines are outlined above.  Orders Placed This Encounter  Procedures  . EKG 12-Lead   Meds ordered this encounter  Medications  . metoprolol succinate (TOPROL-XL) 25 MG 24 hr tablet    Sig: Take 1 tablet (25 mg total) by mouth daily.    Dispense:  90 tablet    Refill:  3    Patient Instructions  Medication Instructions:  Please decrease Metoprolol Succinate to 25 mg a day.  Continue all other medications as listed.  *If you need a refill on your cardiac medications before your next appointment, please call your pharmacy*  Follow-Up: At [facility], you and your health needs are our priority.  As part of our continuing mission to provide you with exceptional heart care, we have created designated Provider Care Teams.  These Care Teams include your primary Cardiologist (physician) and Advanced Practice Providers (APPs -  Physician Assistants and Nurse Practitioners) who all work together to provide you with the care you need, when you need it.  We recommend signing up for the patient portal called "MyChart".  Sign up information is provided on this After Visit Summary.  MyChart is used to connect with patients for Virtual Visits (Telemedicine).  Patients are able to view lab/test results, encounter notes, upcoming  appointments, etc.  Non-urgent messages can be sent to your provider as well.   To learn more about what you can do with MyChart, go to [email or web address].    Your next appointment:   12 month(s)  The format for your next appointment:   In Person  Provider:   Candee Furbish, MD   Thank you for choosing [facility]!!        Signed, Alex Furbish, MD  11/28/2019 10:25 AM    [facility]  Addendum: 12/02/2019 Total cholesterol 125 at clinical trial screening. Will repeat lipids.  Alex Furbish, MD

## 2019-12-02 ENCOUNTER — Encounter: Payer: Medicare PPO | Admitting: *Deleted

## 2019-12-02 ENCOUNTER — Other Ambulatory Visit: Payer: Self-pay

## 2019-12-02 ENCOUNTER — Telehealth: Payer: Self-pay | Admitting: Cardiology

## 2019-12-02 DIAGNOSIS — E785 Hyperlipidemia, unspecified: Secondary | ICD-10-CM

## 2019-12-02 DIAGNOSIS — Z006 Encounter for examination for normal comparison and control in clinical research program: Secondary | ICD-10-CM

## 2019-12-02 DIAGNOSIS — Z79899 Other long term (current) drug therapy: Secondary | ICD-10-CM

## 2019-12-02 NOTE — Research (Signed)
Subject came into research clinic for ORION 4 screening. Subject read over and signed consent at 12:34 on November 15th, 2021. A copy of this consent was given to the patient.   Subject Name: Alex Martin  Subject met inclusion and exclusion criteria.  The informed consent form, study requirements and expectations were reviewed with the subject and questions and concerns were addressed prior to the signing of the consent form.  The subject verbalized understanding of the trial requirements.  The subject agreed to participate in the ORION 4 trial and signed the informed consent at 12:34 on 12/02/19  The informed consent was obtained prior to performance of any protocol-specific procedures for the subject.  A copy of the signed informed consent was given to the subject and a copy was placed in the subject's medical record.   [name]    Subject ended up a screen failure with a cholesterol of 126 mg/dL. To be eligible subject's need to have a cholesterol level of at least 135 mg/dL or greater on the day of screening.

## 2019-12-02 NOTE — Telephone Encounter (Signed)
Left message on VM for pt to c/b to discuss needed lab work.

## 2019-12-02 NOTE — Telephone Encounter (Signed)
Please order lipid panel  Screening for orion-4 study showed Total choles- 125  Candee Furbish, MD

## 2019-12-02 NOTE — Telephone Encounter (Signed)
Pt aware and agreeable to having Lipid panel.  He requests to come in the AM.  Order placed, location verified and pt has been scheduled.  He have no further questions at this time.

## 2019-12-03 ENCOUNTER — Other Ambulatory Visit: Payer: Medicare PPO

## 2019-12-03 DIAGNOSIS — E785 Hyperlipidemia, unspecified: Secondary | ICD-10-CM

## 2019-12-03 DIAGNOSIS — Z79899 Other long term (current) drug therapy: Secondary | ICD-10-CM

## 2019-12-03 LAB — LIPID PANEL
Chol/HDL Ratio: 2.8 ratio (ref 0.0–5.0)
Cholesterol, Total: 146 mg/dL (ref 100–199)
HDL: 53 mg/dL (ref >39)
LDL Chol Calc (NIH): 77 mg/dL (ref 0–99)
Triglycerides: 81 mg/dL (ref 0–149)
VLDL Cholesterol Cal: 16 mg/dL (ref 5–40)

## 2019-12-04 ENCOUNTER — Telehealth: Payer: Self-pay | Admitting: *Deleted

## 2019-12-04 DIAGNOSIS — E785 Hyperlipidemia, unspecified: Secondary | ICD-10-CM

## 2019-12-04 MED ORDER — ROSUVASTATIN CALCIUM 40 MG PO TABS: 40.0000 mg | ORAL_TABLET | Freq: Every day | ORAL | 3 refills | Status: DC

## 2019-12-04 NOTE — Telephone Encounter (Signed)
Patient notified. Will send prescription to [facility] on [address].  Patient will come in for lab work on 03/10/20

## 2019-12-04 NOTE — Telephone Encounter (Signed)
-----   Message from Jerline Pain, MD sent at 12/04/2019  6:19 AM EST ----- LDL 77.  Goal is less than 70. Lets change him from atorvastatin 80 mg over to Crestor 40 mg daily.Repeat lipid panel in 3 months.  Candee Furbish, MD

## 2019-12-11 ENCOUNTER — Other Ambulatory Visit: Payer: Self-pay | Admitting: Cardiology

## 2019-12-24 DIAGNOSIS — H401131 Primary open-angle glaucoma, bilateral, mild stage: Secondary | ICD-10-CM | POA: Diagnosis not present

## 2019-12-24 DIAGNOSIS — H43813 Vitreous degeneration, bilateral: Secondary | ICD-10-CM | POA: Diagnosis not present

## 2019-12-24 DIAGNOSIS — H3562 Retinal hemorrhage, left eye: Secondary | ICD-10-CM | POA: Diagnosis not present

## 2019-12-24 DIAGNOSIS — H2513 Age-related nuclear cataract, bilateral: Secondary | ICD-10-CM | POA: Diagnosis not present

## 2019-12-26 NOTE — Progress Notes (Signed)
[facility] Note  12/30/2019     CHIEF COMPLAINT Patient presents for Retina Evaluation   HISTORY OF PRESENT ILLNESS: Alex Martin is a 79 y.o. male who presents to the clinic today for:   HPI    Retina Evaluation    In both eyes.  This started weeks ago.  Duration of weeks.  Context:  distance vision and near vision.  I, the attending physician,  performed the HPI with the patient and updated documentation appropriately.          Comments    Pt had Lasik 20 years ago OD ([facility]) Pt states Dr. Posey Pronto has been managing Glaucoma and sees her every 6 months.  Patient is currently using Latanoprost QHS OU and Timolol QAM OU.  Pt denies eye pain or discomfort.  Patient denies any change in vision, pain or discomfort, and denies any new or worsening floaters or fol OU.       Last edited by Bernarda Caffey, MD on 12/30/2019  5:20 PM. (History)    pt is here on the referral of Dr. Virgina Evener for concern of bleeding behind the eye, pt states he has been a pt of hers for about 10 years, he states Dr. Posey Pronto first saw the bleeding about a month ago and then had him follow up in a month and still saw the bleeding, pt states he has no visual symptoms, he states he takes a baby aspirin every day, he had a heart attack in January 2020 and had 4 bypasses, pt had lasik about 20 years ago in [place]  Referring physician: Virgina Evener, [address] [place],  [place] [address]  HISTORICAL INFORMATION:   Selected notes from the MEDICAL RECORD NUMBER Referred by Dr. Sammie Bench for eval of ret heme OS/Rule out vascular occlusion LEE:  Ocular Hx- PMH-    CURRENT MEDICATIONS: Current Outpatient Medications (Ophthalmic Drugs)  Medication Sig  . latanoprost (XALATAN) 0.005 % ophthalmic solution Place 1 drop into both eyes at bedtime.   . timolol (TIMOPTIC) 0.5 % ophthalmic solution Place 1 drop into both eyes daily.   No current  facility-administered medications for this visit. (Ophthalmic Drugs)   Current Outpatient Medications (Other)  Medication Sig  . aspirin EC 81 MG tablet Take 81 mg by mouth daily.  . Cyanocobalamin (VITAMIN B-12 PO) Take 1 tablet by mouth daily.  Marland Kitchen doxazosin (CARDURA) 2 MG tablet Take 2 mg by mouth daily.  Marland Kitchen esomeprazole (NEXIUM) 40 MG capsule Take 1 capsule (40 mg total) by mouth every other day.  . hydrochlorothiazide (HYDRODIURIL) 25 MG tablet Take 1 tablet (25 mg total) by mouth daily.  Marland Kitchen losartan (COZAAR) 100 MG tablet Take 1 tablet (100 mg total) by mouth daily.  . metoprolol succinate (TOPROL-XL) 25 MG 24 hr tablet Take 1 tablet (25 mg total) by mouth daily.  . Multiple Vitamin (MULTIVITAMIN) tablet Take 1 tablet by mouth daily.  . rosuvastatin (CRESTOR) 40 MG tablet Take 1 tablet (40 mg total) by mouth daily.  . solifenacin (VESICARE) 5 MG tablet Take 5 mg by mouth daily.   No current facility-administered medications for this visit. (Other)      REVIEW OF SYSTEMS: ROS    Positive for: Eyes   Negative for: Constitutional, Gastrointestinal, Neurological, Skin, Genitourinary, Musculoskeletal, HENT, Endocrine, Cardiovascular, Respiratory, Psychiatric, Allergic/Imm, Heme/Lymph   Last edited by Doneen Poisson on 12/30/2019  1:29 PM. (History)  ALLERGIES Allergies  Allergen Reactions  . Amlodipine Swelling    swelling in ankles  . Shellfish Allergy Other (See Comments)    N&V AFTER EATING MUSSELS    PAST MEDICAL HISTORY Past Medical History:  Diagnosis Date  . Abdominal distension    UMBILICAL HERNIA-NO PAIN  . Bleeding from the nose    SEVERAL EPISODES THAT REQUIRED TX IN ER--LAST EPISODE WAS APPROX 1 YR AGO  . Blood in urine    MICROSCOPIC BLOODEVALUATED BY DR. NESI FRIDAY 02/11/11--URINE SAMPLE AND EXAM-- AND BLOOD SAMPLE AND TO BE SEEN AGAIN IN 6 MONTHS AND GIVEN VESICARE  FOR FREQUENCY AND URGENCY  . Coronary artery disease   . GERD (gastroesophageal  reflux disease)   . Hyperlipidemia   . Hypertension    Past Surgical History:  Procedure Laterality Date  . CARDIAC CATHETERIZATION    . CORONARY ARTERY BYPASS GRAFT N/A 02/05/2018   Procedure: CORONARY ARTERY BYPASS GRAFTING (CABG) x4 USING BILATERAL IMA'S WITH LEFT GREATER SAPHENOUS VEIN HARVEST AND EXPLORATION OF RIGHT SAPHENOUS;  Surgeon: Gaye Pollack, MD;  Location: [facility];  Service: Open Heart Surgery;  Laterality: N/A;  . [place]  . LEFT HEART CATH AND CORONARY ANGIOGRAPHY N/A 02/02/2018   Procedure: LEFT HEART CATH AND CORONARY ANGIOGRAPHY;  Surgeon: Martinique, Peter M, MD;  Location: [facility];  Service: Cardiovascular;  Laterality: N/A;  . TEE WITHOUT CARDIOVERSION N/A 02/05/2018   Procedure: TRANSESOPHAGEAL ECHOCARDIOGRAM (TEE);  Surgeon: Gaye Pollack, MD;  Location: [facility];  Service: Open Heart Surgery;  Laterality: N/A;  . UMBILICAL HERNIA REPAIR  02/15/2011   Procedure: HERNIA REPAIR UMBILICAL ADULT;  Surgeon: Odis Hollingshead, MD;  Location: [facility];  Service: General;  Laterality: N/A;  umbilical hernia repair with mesh  . VASECTOMY  1976    FAMILY HISTORY Family History  Problem Relation Age of Onset  . Heart disease Mother   . Heart disease Father   . Cancer Sister        pancreatic  . Esophageal cancer Neg Hx   . Stomach cancer Neg Hx   . Pancreatic cancer Neg Hx     SOCIAL HISTORY Social History   Tobacco Use  . Smoking status: Former Smoker    Years: 10.00    Quit date: 02/22/1992    Years since quitting: 27.8  . Smokeless tobacco: Never Used  . Tobacco comment: QUIT SMOKING ABOUT 1994 - 1 PP WEEK  Vaping Use  . Vaping Use: Never used  Substance Use Topics  . Alcohol use: Yes    Comment: 2 GLASSES WINE DAILY  . Drug use: No         OPHTHALMIC EXAM:  Base Eye Exam    Visual Acuity (Snellen - Linear)      Right Left   Dist cc 20/30 +1 20/30 -2   Dist ph cc 20/25 -1 NI   Correction: Glasses       Tonometry  (Tonopen, 1:46 PM)      Right Left   Pressure 13 11       Pupils      Dark Light Shape React APD   Right 3 2 Round Brisk 0   Left 3 2 Round Brisk 0       Visual Fields      Left Right    Full Full       Extraocular Movement      Right Left    Full  Full       Neuro/Psych    Oriented x3: Yes   Mood/Affect: Normal       Dilation    Both eyes: 1.0% Mydriacyl, 2.5% Phenylephrine @ 1:46 PM        Slit Lamp and Fundus Exam    Slit Lamp Exam      Right Left   Lids/Lashes Dermatochalasis - upper lid, Meibomian gland dysfunction Dermatochalasis - upper lid, Meibomian gland dysfunction   Conjunctiva/Sclera Mild nasal and temporal pinguecula Mild nasal and temporal pinguecula   Cornea arcus, 1+ Punctate epithelial erosions, well healed lasik flap arcus, 1+ Punctate epithelial erosions, well healed lasik flap   Anterior Chamber Deep and quiet Deep and quiet   Iris Round and dilated Round and dilated   Lens 2-3+ Nuclear sclerosis, 2-3+ Cortical cataract 2-3+ Nuclear sclerosis, 2-3+ Cortical cataract   Vitreous Vitreous syneresis Vitreous syneresis       Fundus Exam      Right Left   Disc Pink and Sharp, +heme at 0500 Pallor, Sharp rim, +cupping, +PPA   C/D Ratio 0.6 0.7   Macula Flat, Blunted foveal reflex, RPE mottling and clumping, No heme or edema Flat, Blunted foveal reflex, RPE mottling and clumping, No heme or edema   Vessels attenuated, Tortuous, mild Copper wiring attenuated, Tortuous, mild Copper wiring   Periphery Attached, mild reticular degeneration, peripheral drusen Attached, faint mild cluster of IRH temporal periphery, mild reticular degeneration, peripheral drusen        Refraction    Wearing Rx      Sphere Cylinder Axis   Right +1.25 +0.75 049   Left +0.25 +0.50 076       Manifest Refraction      Sphere Cylinder Axis Dist VA   Right -0.25 +0.75 048 20/25-2   Left -0.50 +0.50 075 20/30-2          IMAGING AND PROCEDURES  Imaging and Procedures  for 12/30/2019  OCT, Retina - OU - Both Eyes       Right Eye Quality was good. Central Foveal Thickness: 270. Progression has no prior data. Findings include normal foveal contour, no IRF, no SRF.   Left Eye Quality was good. Central Foveal Thickness: 276. Progression has no prior data. Findings include no IRF, normal foveal contour, no SRF.   Notes *Images captured and stored on drive  Diagnosis / Impression:  NFP, no IRF/SRF OU  Clinical management:  See below  Abbreviations: NFP - Normal foveal profile. CME - cystoid macular edema. PED - pigment epithelial detachment. IRF - intraretinal fluid. SRF - subretinal fluid. EZ - ellipsoid zone. ERM - epiretinal membrane. ORA - outer retinal atrophy. ORT - outer retinal tubulation. SRHM - subretinal hyper-reflective material. IRHM - intraretinal hyper-reflective material        Fluorescein Angiography Optos (Transit OS)       Right Eye   Progression has no prior data. Early phase findings include normal observations. Mid/Late phase findings include normal observations.   Left Eye   Progression has no prior data. Early phase findings include normal observations. Mid/Late phase findings include normal observations (No vascular perfusion defects; no leakage).   Notes **Images stored on drive**  Impression: Normal study OU - no vascular perfusion defect or leakage OS -- no RVO                  ASSESSMENT/PLAN:    ICD-10-CM   1. Retinal hemorrhage of left eye  H35.62   2.  Retinal edema  H35.81 OCT, Retina - OU - Both Eyes  3. Essential hypertension  I10   4. Hypertensive retinopathy of both eyes  H35.033 Fluorescein Angiography Optos (Transit OS)  5. Combined forms of age-related cataract of both eyes  H25.813   6. Primary open angle glaucoma (POAG) of both eyes, moderate stage  H40.1132   7. Hx of LASIK  Z98.890     1,2. Retinal hemorrhage OS  - small peripheral cluster of IRH temporal periphery  - no  retinal edema  - widefield FA 12.13.21 shows normal study -- no vascular perfusion defect, no leakage -- no RVO component  - unclear etiology of retinal hemes but likely combination of HTN and ASA use  - no retinal intervention recommended at this time  - monitor  - f/u 2-3 mos for DFE/OCT, review FA  3,4. Hypertensive retinopathy OU - discussed importance of tight BP control - monitor  5. Mixed Cataract OU - The symptoms of cataract, surgical options, and treatments and risks were discussed with patient. - discussed diagnosis and progression - not yet visually significant - monitor for now  6. POAG OU - IOP today: 13,11 - on latanoprost QHS OU and Timolol QAM OU - under the expert management of Dr. Posey Pronto  7. Hx of lasik  - done in [place], [place]  - stable  Ophthalmic Meds Ordered this visit:  No orders of the defined types were placed in this encounter.      Return for 2-3 mos -- retinal hemes OS.  There are no Patient Instructions on file for this visit.   Explained the diagnoses, plan, and follow up with the patient and they expressed understanding.  Patient expressed understanding of the importance of proper follow up care.   This document serves as a record of services personally performed by Gardiner Sleeper, MD, PhD. It was created on their behalf by Leeann Must, [place], an ophthalmic technician. The creation of this record is the provider's dictation and/or activities during the visit.    Electronically signed by: Leeann Must, COA 12.09.2021 5:23 PM   Gardiner Sleeper, M.D., Ph.D. Diseases & Surgery of the Retina and Vitreous [facility]  I have reviewed the above documentation for accuracy and completeness, and I agree with the above. Gardiner Sleeper, M.D., Ph.D. 12/30/19 5:23 PM  Abbreviations: M myopia (nearsighted); A astigmatism; H hyperopia (farsighted); P presbyopia; Mrx spectacle prescription;  CTL contact lenses; OD right  eye; OS left eye; OU both eyes  XT exotropia; ET esotropia; PEK punctate epithelial keratitis; PEE punctate epithelial erosions; DES dry eye syndrome; MGD meibomian gland dysfunction; ATs artificial tears; PFAT's preservative free artificial tears; [place] nuclear sclerotic cataract; PSC posterior subcapsular cataract; ERM epi-retinal membrane; PVD posterior vitreous detachment; RD retinal detachment; DM diabetes mellitus; DR diabetic retinopathy; NPDR non-proliferative diabetic retinopathy; PDR proliferative diabetic retinopathy; CSME clinically significant macular edema; DME diabetic macular edema; dbh dot blot hemorrhages; CWS cotton wool spot; POAG primary open angle glaucoma; C/D cup-to-disc ratio; HVF humphrey visual field; GVF goldmann visual field; OCT optical coherence tomography; IOP intraocular pressure; BRVO Branch retinal vein occlusion; CRVO central retinal vein occlusion; CRAO central retinal artery occlusion; BRAO branch retinal artery occlusion; RT retinal tear; SB scleral buckle; PPV pars plana vitrectomy; VH Vitreous hemorrhage; PRP panretinal laser photocoagulation; IVK intravitreal kenalog; VMT vitreomacular traction; MH Macular hole;  NVD neovascularization of the disc; NVE neovascularization elsewhere; AREDS age related eye disease study; ARMD age related macular  degeneration; POAG primary open angle glaucoma; EBMD epithelial/anterior basement membrane dystrophy; ACIOL anterior chamber intraocular lens; IOL intraocular lens; PCIOL posterior chamber intraocular lens; Phaco/IOL phacoemulsification with intraocular lens placement; [place] photorefractive keratectomy; LASIK laser assisted in situ keratomileusis; HTN hypertension; DM diabetes mellitus; COPD chronic obstructive pulmonary disease

## 2019-12-30 ENCOUNTER — Other Ambulatory Visit: Payer: Self-pay

## 2019-12-30 ENCOUNTER — Encounter (INDEPENDENT_AMBULATORY_CARE_PROVIDER_SITE_OTHER): Payer: Self-pay | Admitting: Ophthalmology

## 2019-12-30 ENCOUNTER — Ambulatory Visit (INDEPENDENT_AMBULATORY_CARE_PROVIDER_SITE_OTHER): Payer: Medicare PPO | Admitting: Ophthalmology

## 2019-12-30 DIAGNOSIS — H3562 Retinal hemorrhage, left eye: Secondary | ICD-10-CM | POA: Diagnosis not present

## 2019-12-30 DIAGNOSIS — H3581 Retinal edema: Secondary | ICD-10-CM

## 2019-12-30 DIAGNOSIS — H35033 Hypertensive retinopathy, bilateral: Secondary | ICD-10-CM | POA: Diagnosis not present

## 2019-12-30 DIAGNOSIS — Z9889 Other specified postprocedural states: Secondary | ICD-10-CM

## 2019-12-30 DIAGNOSIS — I10 Essential (primary) hypertension: Secondary | ICD-10-CM

## 2019-12-30 DIAGNOSIS — H401132 Primary open-angle glaucoma, bilateral, moderate stage: Secondary | ICD-10-CM | POA: Diagnosis not present

## 2019-12-30 DIAGNOSIS — H25813 Combined forms of age-related cataract, bilateral: Secondary | ICD-10-CM | POA: Diagnosis not present

## 2020-02-25 NOTE — Progress Notes (Signed)
[facility] Note  03/02/2020     CHIEF COMPLAINT Patient presents for Retina Follow Up   HISTORY OF PRESENT ILLNESS: Alex Martin is a 80 y.o. male who presents to the clinic today for:   HPI    Retina Follow Up    Patient presents with  Other.  In left eye.  Duration of 2 months.  Since onset it is stable.  I, the attending physician,  performed the HPI with the patient and updated documentation appropriately.          Comments    2 month follow up Retinal heme OS- Pt is doing well, no changes in vision since last visit OU.  Taking Latanoprost qhs and Timolol qam OU.        Last edited by Bernarda Caffey, MD on 03/02/2020  1:49 PM. (History)    pt states no change in vision, no new health concerns   Referring physician: Virgina Evener, [address] [place],  [place] [address]  HISTORICAL INFORMATION:   Selected notes from the MEDICAL RECORD NUMBER Referred by Dr. Sammie Bench for eval of ret heme OS/Rule out vascular occlusion LEE:  Ocular Hx- PMH-    CURRENT MEDICATIONS: Current Outpatient Medications (Ophthalmic Drugs)  Medication Sig  . latanoprost (XALATAN) 0.005 % ophthalmic solution Place 1 drop into both eyes at bedtime.   . timolol (TIMOPTIC) 0.5 % ophthalmic solution Place 1 drop into both eyes daily.   No current facility-administered medications for this visit. (Ophthalmic Drugs)   Current Outpatient Medications (Other)  Medication Sig  . aspirin EC 81 MG tablet Take 81 mg by mouth daily.  . Cyanocobalamin (VITAMIN B-12 PO) Take 1 tablet by mouth daily.  Marland Kitchen doxazosin (CARDURA) 2 MG tablet Take 2 mg by mouth daily.  Marland Kitchen esomeprazole (NEXIUM) 40 MG capsule Take 1 capsule (40 mg total) by mouth every other day.  . hydrochlorothiazide (HYDRODIURIL) 25 MG tablet Take 1 tablet (25 mg total) by mouth daily.  Marland Kitchen losartan (COZAAR) 100 MG tablet Take 1 tablet (100 mg total) by mouth daily.  . metoprolol succinate  (TOPROL-XL) 25 MG 24 hr tablet Take 1 tablet (25 mg total) by mouth daily.  . Multiple Vitamin (MULTIVITAMIN) tablet Take 1 tablet by mouth daily.  . rosuvastatin (CRESTOR) 40 MG tablet Take 1 tablet (40 mg total) by mouth daily.  . solifenacin (VESICARE) 5 MG tablet Take 5 mg by mouth daily.   No current facility-administered medications for this visit. (Other)      REVIEW OF SYSTEMS: ROS    Positive for: Gastrointestinal, Cardiovascular, Eyes   Negative for: Constitutional, Neurological, Skin, Genitourinary, Musculoskeletal, HENT, Endocrine, Respiratory, Psychiatric, Allergic/Imm, Heme/Lymph   Last edited by Leonie Douglas, COA on 03/02/2020  1:09 PM. (History)       ALLERGIES Allergies  Allergen Reactions  . Amlodipine Swelling    swelling in ankles  . Shellfish Allergy Other (See Comments)    N&V AFTER EATING MUSSELS    PAST MEDICAL HISTORY Past Medical History:  Diagnosis Date  . Abdominal distension    UMBILICAL HERNIA-NO PAIN  . Bleeding from the nose    SEVERAL EPISODES THAT REQUIRED TX IN ER--LAST EPISODE WAS APPROX 1 YR AGO  . Blood in urine    MICROSCOPIC BLOODEVALUATED BY DR. NESI FRIDAY 02/11/11--URINE SAMPLE AND EXAM-- AND BLOOD SAMPLE AND TO BE SEEN AGAIN IN 6 MONTHS AND GIVEN VESICARE  FOR FREQUENCY AND URGENCY  .  Coronary artery disease   . GERD (gastroesophageal reflux disease)   . Hyperlipidemia   . Hypertension    Past Surgical History:  Procedure Laterality Date  . CARDIAC CATHETERIZATION    . CORONARY ARTERY BYPASS GRAFT N/A 02/05/2018   Procedure: CORONARY ARTERY BYPASS GRAFTING (CABG) x4 USING BILATERAL IMA'S WITH LEFT GREATER SAPHENOUS VEIN HARVEST AND EXPLORATION OF RIGHT SAPHENOUS;  Surgeon: Gaye Pollack, MD;  Location: [facility];  Service: Open Heart Surgery;  Laterality: N/A;  . [place]  . LEFT HEART CATH AND CORONARY ANGIOGRAPHY N/A 02/02/2018   Procedure: LEFT HEART CATH AND CORONARY ANGIOGRAPHY;  Surgeon: Martinique, Peter M,  MD;  Location: [facility];  Service: Cardiovascular;  Laterality: N/A;  . TEE WITHOUT CARDIOVERSION N/A 02/05/2018   Procedure: TRANSESOPHAGEAL ECHOCARDIOGRAM (TEE);  Surgeon: Gaye Pollack, MD;  Location: [facility];  Service: Open Heart Surgery;  Laterality: N/A;  . UMBILICAL HERNIA REPAIR  02/15/2011   Procedure: HERNIA REPAIR UMBILICAL ADULT;  Surgeon: Odis Hollingshead, MD;  Location: [facility];  Service: General;  Laterality: N/A;  umbilical hernia repair with mesh  . VASECTOMY  1976    FAMILY HISTORY Family History  Problem Relation Age of Onset  . Heart disease Mother   . Heart disease Father   . Cancer Sister        pancreatic  . Esophageal cancer Neg Hx   . Stomach cancer Neg Hx   . Pancreatic cancer Neg Hx     SOCIAL HISTORY Social History   Tobacco Use  . Smoking status: Former Smoker    Years: 10.00    Quit date: 02/22/1992    Years since quitting: 28.0  . Smokeless tobacco: Never Used  . Tobacco comment: QUIT SMOKING ABOUT 1994 - 1 PP WEEK  Vaping Use  . Vaping Use: Never used  Substance Use Topics  . Alcohol use: Yes    Comment: 2 GLASSES WINE DAILY  . Drug use: No         OPHTHALMIC EXAM:  Base Eye Exam    Visual Acuity (Snellen - Linear)      Right Left   Dist cc 20/25 -2 20/25 -2       Tonometry (Tonopen, 1:13 PM)      Right Left   Pressure 15 13       Pupils      Dark Light Shape React APD   Right 3 2 Round Brisk None   Left 3 2 Round Brisk None       Visual Fields (Counting fingers)      Left Right    Full Full       Extraocular Movement      Right Left    Full Full       Neuro/Psych    Oriented x3: Yes   Mood/Affect: Normal       Dilation    Both eyes: 1.0% Mydriacyl, 2.5% Phenylephrine @ 1:15 PM        Slit Lamp and Fundus Exam    Slit Lamp Exam      Right Left   Lids/Lashes Dermatochalasis - upper lid, Meibomian gland dysfunction Dermatochalasis - upper lid, Meibomian gland dysfunction   Conjunctiva/Sclera Mild  nasal and temporal pinguecula Mild nasal and temporal pinguecula   Cornea Arcus, trace Punctate epithelial erosions, well healed lasik flap, mild tear film debris arcus, 1+ Punctate epithelial erosions, well healed lasik flap, trace tear film debris  Anterior Chamber Deep and quiet Deep and quiet   Iris Round and dilated Round and dilated   Lens 2-3+ Nuclear sclerosis, 2-3+ Cortical cataract 2-3+ Nuclear sclerosis, 2-3+ Cortical cataract   Vitreous Vitreous syneresis Vitreous syneresis       Fundus Exam      Right Left   Disc Pink and Sharp, +heme at 0500 -- resolved, +PPA Pallor, Sharp rim, +cupping, +PPA   C/D Ratio 0.6 0.7   Macula Flat, Blunted foveal reflex, RPE mottling and clumping, No heme or edema Flat, Blunted foveal reflex, RPE mottling and clumping, No heme or edema   Vessels attenuated, Tortuous, mild Copper wiring attenuated, Tortuous, mild Copper wiring   Periphery Attached, mild reticular degeneration, peripheral drusen Attached, faint mild cluster of IRH temporal periphery -- resolved, mild reticular degeneration, peripheral drusen          IMAGING AND PROCEDURES  Imaging and Procedures for 03/02/2020  OCT, Retina - OU - Both Eyes       Right Eye Quality was good. Central Foveal Thickness: 271. Progression has been stable. Findings include normal foveal contour, no IRF, no SRF.   Left Eye Quality was good. Central Foveal Thickness: 277. Progression has been stable. Findings include no IRF, normal foveal contour, no SRF.   Notes *Images captured and stored on drive  Diagnosis / Impression:  NFP, no IRF/SRF OU  Clinical management:  See below  Abbreviations: NFP - Normal foveal profile. CME - cystoid macular edema. PED - pigment epithelial detachment. IRF - intraretinal fluid. SRF - subretinal fluid. EZ - ellipsoid zone. ERM - epiretinal membrane. ORA - outer retinal atrophy. ORT - outer retinal tubulation. SRHM - subretinal hyper-reflective material. IRHM -  intraretinal hyper-reflective material                 ASSESSMENT/PLAN:    ICD-10-CM   1. Retinal hemorrhage of left eye  H35.62   2. Retinal edema  H35.81 OCT, Retina - OU - Both Eyes  3. Essential hypertension  I10   4. Hypertensive retinopathy of both eyes  H35.033   5. Combined forms of age-related cataract of both eyes  H25.813   6. Primary open angle glaucoma (POAG) of both eyes, moderate stage  H40.1132   7. Hx of LASIK  Z98.890     1,2. Retinal hemorrhage OS  - small peripheral cluster of IRH temporal periphery -- resolved today  - no retinal edema  - widefield FA 12.13.21 shows normal study -- no vascular perfusion defect, no leakage -- no RVO component  - unclear etiology of retinal hemes but likely combination of HTN and ASA use  - no retinal intervention recommended at this time  - pt is cleared from a retina standpoint for release to Dr. Posey Pronto and resumption of primary eye care  3,4. Hypertensive retinopathy OU - discussed importance of tight BP control - monitor  5. Mixed Cataract OU - The symptoms of cataract, surgical options, and treatments and risks were discussed with patient. - discussed diagnosis and progression - not yet visually significant - monitor for now  6. POAG OU - IOP today: 15,13 - on latanoprost QHS OU and Timolol QAM OU - under the expert management of Dr. Posey Pronto  7. Hx of lasik  - done in [place], [place]  - stable  Ophthalmic Meds Ordered this visit:  No orders of the defined types were placed in this encounter.      Return if symptoms worsen or fail to improve.  There are no Patient Instructions on file for this visit.  This document serves as a record of services personally performed by Gardiner Sleeper, MD, PhD. It was created on their behalf by Leeann Must, [place], an ophthalmic technician. The creation of this record is the provider's dictation and/or activities during the visit.    Electronically signed by: Leeann Must, COA '@TODAY' @ 1:50 PM   This document serves as a record of services personally performed by Gardiner Sleeper, MD, PhD. It was created on their behalf by San Jetty. Owens Shark, OA an ophthalmic technician. The creation of this record is the provider's dictation and/or activities during the visit.    Electronically signed by: San Jetty. Owens Shark, [place] 02.14.2022 1:50 PM  Gardiner Sleeper, M.D., Ph.D. Diseases & Surgery of the Retina and [place] 03/02/2020   I have reviewed the above documentation for accuracy and completeness, and I agree with the above. Gardiner Sleeper, M.D., Ph.D. 03/02/20 1:50 PM   Abbreviations: M myopia (nearsighted); A astigmatism; H hyperopia (farsighted); P presbyopia; Mrx spectacle prescription;  CTL contact lenses; OD right eye; OS left eye; OU both eyes  XT exotropia; ET esotropia; PEK punctate epithelial keratitis; PEE punctate epithelial erosions; DES dry eye syndrome; MGD meibomian gland dysfunction; ATs artificial tears; PFAT's preservative free artificial tears; [place] nuclear sclerotic cataract; PSC posterior subcapsular cataract; ERM epi-retinal membrane; PVD posterior vitreous detachment; RD retinal detachment; DM diabetes mellitus; DR diabetic retinopathy; NPDR non-proliferative diabetic retinopathy; PDR proliferative diabetic retinopathy; CSME clinically significant macular edema; DME diabetic macular edema; dbh dot blot hemorrhages; CWS cotton wool spot; POAG primary open angle glaucoma; C/D cup-to-disc ratio; HVF humphrey visual field; GVF goldmann visual field; OCT optical coherence tomography; IOP intraocular pressure; BRVO Branch retinal vein occlusion; CRVO central retinal vein occlusion; CRAO central retinal artery occlusion; BRAO branch retinal artery occlusion; RT retinal tear; SB scleral buckle; PPV pars plana vitrectomy; VH Vitreous hemorrhage; PRP panretinal laser photocoagulation; IVK intravitreal kenalog; VMT vitreomacular  traction; MH Macular hole;  NVD neovascularization of the disc; NVE neovascularization elsewhere; AREDS age related eye disease study; ARMD age related macular degeneration; POAG primary open angle glaucoma; EBMD epithelial/anterior basement membrane dystrophy; ACIOL anterior chamber intraocular lens; IOL intraocular lens; PCIOL posterior chamber intraocular lens; Phaco/IOL phacoemulsification with intraocular lens placement; [place] photorefractive keratectomy; LASIK laser assisted in situ keratomileusis; HTN hypertension; DM diabetes mellitus; COPD chronic obstructive pulmonary disease

## 2020-03-02 ENCOUNTER — Ambulatory Visit (INDEPENDENT_AMBULATORY_CARE_PROVIDER_SITE_OTHER): Payer: Medicare PPO | Admitting: Ophthalmology

## 2020-03-02 ENCOUNTER — Encounter (INDEPENDENT_AMBULATORY_CARE_PROVIDER_SITE_OTHER): Payer: Self-pay | Admitting: Ophthalmology

## 2020-03-02 ENCOUNTER — Other Ambulatory Visit: Payer: Self-pay

## 2020-03-02 DIAGNOSIS — Z9889 Other specified postprocedural states: Secondary | ICD-10-CM

## 2020-03-02 DIAGNOSIS — H3562 Retinal hemorrhage, left eye: Secondary | ICD-10-CM

## 2020-03-02 DIAGNOSIS — H35033 Hypertensive retinopathy, bilateral: Secondary | ICD-10-CM

## 2020-03-02 DIAGNOSIS — H25813 Combined forms of age-related cataract, bilateral: Secondary | ICD-10-CM

## 2020-03-02 DIAGNOSIS — H3581 Retinal edema: Secondary | ICD-10-CM

## 2020-03-02 DIAGNOSIS — H401132 Primary open-angle glaucoma, bilateral, moderate stage: Secondary | ICD-10-CM | POA: Diagnosis not present

## 2020-03-02 DIAGNOSIS — I10 Essential (primary) hypertension: Secondary | ICD-10-CM

## 2020-03-10 ENCOUNTER — Other Ambulatory Visit: Payer: Medicare PPO | Admitting: *Deleted

## 2020-03-10 ENCOUNTER — Other Ambulatory Visit: Payer: Self-pay

## 2020-03-10 DIAGNOSIS — E785 Hyperlipidemia, unspecified: Secondary | ICD-10-CM

## 2020-03-10 LAB — LIPID PANEL
Chol/HDL Ratio: 2.7 ratio (ref 0.0–5.0)
Cholesterol, Total: 141 mg/dL (ref 100–199)
HDL: 52 mg/dL (ref >39)
LDL Chol Calc (NIH): 66 mg/dL (ref 0–99)
Triglycerides: 134 mg/dL (ref 0–149)
VLDL Cholesterol Cal: 23 mg/dL (ref 5–40)

## 2020-03-31 ENCOUNTER — Other Ambulatory Visit: Payer: Self-pay | Admitting: *Deleted

## 2020-03-31 DIAGNOSIS — Z951 Presence of aortocoronary bypass graft: Secondary | ICD-10-CM

## 2020-03-31 DIAGNOSIS — Z136 Encounter for screening for cardiovascular disorders: Secondary | ICD-10-CM

## 2020-03-31 DIAGNOSIS — I10 Essential (primary) hypertension: Secondary | ICD-10-CM

## 2020-03-31 DIAGNOSIS — Z1159 Encounter for screening for other viral diseases: Secondary | ICD-10-CM

## 2020-03-31 DIAGNOSIS — E7800 Pure hypercholesterolemia, unspecified: Secondary | ICD-10-CM

## 2020-04-05 ENCOUNTER — Other Ambulatory Visit: Payer: Self-pay | Admitting: Family Medicine

## 2020-04-15 ENCOUNTER — Other Ambulatory Visit: Payer: Self-pay

## 2020-04-15 ENCOUNTER — Other Ambulatory Visit: Payer: Medicare PPO

## 2020-04-15 DIAGNOSIS — Z136 Encounter for screening for cardiovascular disorders: Secondary | ICD-10-CM | POA: Diagnosis not present

## 2020-04-15 DIAGNOSIS — E7800 Pure hypercholesterolemia, unspecified: Secondary | ICD-10-CM | POA: Diagnosis not present

## 2020-04-15 DIAGNOSIS — Z951 Presence of aortocoronary bypass graft: Secondary | ICD-10-CM | POA: Diagnosis not present

## 2020-04-15 DIAGNOSIS — I10 Essential (primary) hypertension: Secondary | ICD-10-CM | POA: Diagnosis not present

## 2020-04-15 DIAGNOSIS — Z1159 Encounter for screening for other viral diseases: Secondary | ICD-10-CM | POA: Diagnosis not present

## 2020-04-16 ENCOUNTER — Other Ambulatory Visit: Payer: Medicare PPO

## 2020-04-16 LAB — CBC WITH DIFFERENTIAL/PLATELET
Absolute Monocytes: 725 cells/uL (ref 200–950)
Basophils Absolute: 90 cells/uL (ref 0–200)
Basophils Relative: 1.8 %
Eosinophils Absolute: 425 cells/uL (ref 15–500)
Eosinophils Relative: 8.5 %
HCT: 37.6 % — ABNORMAL LOW (ref 38.5–50.0)
Hemoglobin: 12.2 g/dL — ABNORMAL LOW (ref 13.2–17.1)
Lymphs Abs: 1650 cells/uL (ref 850–3900)
MCH: 31 pg (ref 27.0–33.0)
MCHC: 32.4 g/dL (ref 32.0–36.0)
MCV: 95.4 fL (ref 80.0–100.0)
MPV: 10 fL (ref 7.5–12.5)
Monocytes Relative: 14.5 %
Neutro Abs: 2110 cells/uL (ref 1500–7800)
Neutrophils Relative %: 42.2 %
Platelets: 207 10*3/uL (ref 140–400)
RBC: 3.94 10*6/uL — ABNORMAL LOW (ref 4.20–5.80)
RDW: 13.4 % (ref 11.0–15.0)
Total Lymphocyte: 33 %
WBC: 5 10*3/uL (ref 3.8–10.8)

## 2020-04-16 LAB — COMPLETE METABOLIC PANEL WITH GFR
AG Ratio: 1.7 (calc) (ref 1.0–2.5)
ALT: 18 U/L (ref 9–46)
AST: 21 U/L (ref 10–35)
Albumin: 4 g/dL (ref 3.6–5.1)
Alkaline phosphatase (APISO): 55 U/L (ref 35–144)
BUN/Creatinine Ratio: 17 (calc) (ref 6–22)
BUN: 21 mg/dL (ref 7–25)
CO2: 28 mmol/L (ref 20–32)
Calcium: 9 mg/dL (ref 8.6–10.3)
Chloride: 104 mmol/L (ref 98–110)
Creat: 1.26 mg/dL — ABNORMAL HIGH (ref 0.70–1.18)
GFR, Est African American: 62 mL/min/{1.73_m2} (ref >=60)
GFR, Est Non African American: 54 mL/min/{1.73_m2} — ABNORMAL LOW (ref >=60)
Globulin: 2.3 g/dL (calc) (ref 1.9–3.7)
Glucose, Bld: 94 mg/dL (ref 65–99)
Potassium: 4.5 mmol/L (ref 3.5–5.3)
Sodium: 140 mmol/L (ref 135–146)
Total Bilirubin: 0.4 mg/dL (ref 0.2–1.2)
Total Protein: 6.3 g/dL (ref 6.1–8.1)

## 2020-04-16 LAB — HEPATITIS C ANTIBODY
Hepatitis C Ab: NONREACTIVE
SIGNAL TO CUT-OFF: 0.01 (ref <1.00)

## 2020-04-21 ENCOUNTER — Encounter: Payer: Self-pay | Admitting: Family Medicine

## 2020-04-28 DIAGNOSIS — Z08 Encounter for follow-up examination after completed treatment for malignant neoplasm: Secondary | ICD-10-CM | POA: Diagnosis not present

## 2020-04-28 DIAGNOSIS — Z1283 Encounter for screening for malignant neoplasm of skin: Secondary | ICD-10-CM | POA: Diagnosis not present

## 2020-04-28 DIAGNOSIS — L821 Other seborrheic keratosis: Secondary | ICD-10-CM | POA: Diagnosis not present

## 2020-04-28 DIAGNOSIS — Z85828 Personal history of other malignant neoplasm of skin: Secondary | ICD-10-CM | POA: Diagnosis not present

## 2020-04-28 DIAGNOSIS — H04123 Dry eye syndrome of bilateral lacrimal glands: Secondary | ICD-10-CM | POA: Diagnosis not present

## 2020-04-28 DIAGNOSIS — H401131 Primary open-angle glaucoma, bilateral, mild stage: Secondary | ICD-10-CM | POA: Diagnosis not present

## 2020-04-30 ENCOUNTER — Ambulatory Visit (INDEPENDENT_AMBULATORY_CARE_PROVIDER_SITE_OTHER): Payer: Medicare PPO | Admitting: Family Medicine

## 2020-04-30 ENCOUNTER — Other Ambulatory Visit: Payer: Self-pay

## 2020-04-30 ENCOUNTER — Encounter: Payer: Self-pay | Admitting: Family Medicine

## 2020-04-30 VITALS — BP 128/60 | HR 68 | Temp 98.0°F | Resp 14 | Ht 67.0 in | Wt 176.0 lb

## 2020-04-30 DIAGNOSIS — Z125 Encounter for screening for malignant neoplasm of prostate: Secondary | ICD-10-CM

## 2020-04-30 DIAGNOSIS — E7800 Pure hypercholesterolemia, unspecified: Secondary | ICD-10-CM | POA: Diagnosis not present

## 2020-04-30 DIAGNOSIS — Z951 Presence of aortocoronary bypass graft: Secondary | ICD-10-CM | POA: Diagnosis not present

## 2020-04-30 DIAGNOSIS — Z0001 Encounter for general adult medical examination with abnormal findings: Secondary | ICD-10-CM

## 2020-04-30 DIAGNOSIS — I10 Essential (primary) hypertension: Secondary | ICD-10-CM

## 2020-04-30 DIAGNOSIS — Z0000 Encounter for general adult medical examination without abnormal findings: Secondary | ICD-10-CM

## 2020-04-30 NOTE — Progress Notes (Signed)
Subjective:    Patient ID: Alex Martin, male    DOB: 1940/10/21, 80 y.o.   MRN: [id number]  HPI Patient is a very pleasant 80 year old Caucasian male who presents today for complete physical exam. In 2019, he suffered a non-ST elevation myocardial infarction.  He underwent coronary artery bypass grafting x4.  He denies any chest pain shortness of breath or dyspnea on exertion today.  Patient had a colonoscopy last year and they found several polyps.  They recommended a repeat colonoscopy in 3 years.  He also had an EGD which showed changes consistent with Barrett's esophagus although the biopsy was inconclusive.  However this is up-to-date.  He is due for prostate cancer screening.  Immunization records are below and are up-to-date.  He denies any recent falls.  He denies any depression.  He denies any issues with his memory. Immunization History  Administered Date(s) Administered  . Influenza, High Dose Seasonal PF 11/01/2012, 10/14/2015, 09/17/2016, 09/15/2017, 09/11/2018, 09/11/2018, 09/25/2019  . Influenza,inj,Quad PF,6+ Mos 10/29/2014  . Influenza-Unspecified 10/04/2013, 10/14/2015, 09/21/2017, 09/25/2018, 09/25/2019  . Moderna SARS-COV2 Booster Vaccination 11/11/2019  . Moderna Sars-Covid-2 Vaccination 01/31/2019, 02/28/2019, 04/16/2020  . PNEUMOCOCCAL CONJUGATE-20 04/06/2020  . Pneumococcal Conjugate-13 10/29/2014  . Pneumococcal Polysaccharide-23 06/30/2016  . Tdap 06/17/2012  . Zoster 01/18/2007  . Zoster Recombinat (Shingrix) 11/16/2017    Past Medical History:  Diagnosis Date  . Abdominal distension    UMBILICAL HERNIA-NO PAIN  . Bleeding from the nose    SEVERAL EPISODES THAT REQUIRED TX IN ER--LAST EPISODE WAS APPROX 1 YR AGO  . Blood in urine    MICROSCOPIC BLOODEVALUATED BY DR. NESI FRIDAY 02/11/11--URINE SAMPLE AND EXAM-- AND BLOOD SAMPLE AND TO BE SEEN AGAIN IN 6 MONTHS AND GIVEN VESICARE  FOR FREQUENCY AND URGENCY  . Coronary artery disease   . GERD  (gastroesophageal reflux disease)   . Hyperlipidemia   . Hypertension    Past Surgical History:  Procedure Laterality Date  . CARDIAC CATHETERIZATION    . CORONARY ARTERY BYPASS GRAFT N/A 02/05/2018   Procedure: CORONARY ARTERY BYPASS GRAFTING (CABG) x4 USING BILATERAL IMA'S WITH LEFT GREATER SAPHENOUS VEIN HARVEST AND EXPLORATION OF RIGHT SAPHENOUS;  Surgeon: Gaye Pollack, MD;  Location: [facility];  Service: Open Heart Surgery;  Laterality: N/A;  . [place]  . LEFT HEART CATH AND CORONARY ANGIOGRAPHY N/A 02/02/2018   Procedure: LEFT HEART CATH AND CORONARY ANGIOGRAPHY;  Surgeon: Martinique, Peter M, MD;  Location: [facility];  Service: Cardiovascular;  Laterality: N/A;  . TEE WITHOUT CARDIOVERSION N/A 02/05/2018   Procedure: TRANSESOPHAGEAL ECHOCARDIOGRAM (TEE);  Surgeon: Gaye Pollack, MD;  Location: [facility];  Service: Open Heart Surgery;  Laterality: N/A;  . UMBILICAL HERNIA REPAIR  02/15/2011   Procedure: HERNIA REPAIR UMBILICAL ADULT;  Surgeon: Odis Hollingshead, MD;  Location[facility];  Service: General;  Laterality: N/A;  umbilical hernia repair with mesh  . VASECTOMY  1976   Current Outpatient Medications on File Prior to Visit  Medication Sig Dispense Refill  . aspirin EC 81 MG tablet Take 81 mg by mouth daily.    . Cyanocobalamin (VITAMIN B-12 PO) Take 1 tablet by mouth daily.    Marland Kitchen doxazosin (CARDURA) 2 MG tablet Take 2 mg by mouth daily.    Marland Kitchen esomeprazole (NEXIUM) 40 MG capsule Take 1 capsule (40 mg total) by mouth every other day. 45 capsule 3  . hydrochlorothiazide (HYDRODIURIL) 25 MG tablet TAKE 1 TABLET BY  MOUTH EVERY DAY 90 tablet 3  . latanoprost (XALATAN) 0.005 % ophthalmic solution Place 1 drop into both eyes at bedtime.   0  . losartan (COZAAR) 100 MG tablet Take 1 tablet (100 mg total) by mouth daily. 90 tablet 3  . metoprolol succinate (TOPROL-XL) 25 MG 24 hr tablet Take 1 tablet (25 mg total) by mouth daily. 90 tablet 3  . Multiple Vitamin  (MULTIVITAMIN) tablet Take 1 tablet by mouth daily.    . rosuvastatin (CRESTOR) 40 MG tablet Take 1 tablet (40 mg total) by mouth daily. 90 tablet 3  . solifenacin (VESICARE) 5 MG tablet Take 5 mg by mouth daily.    . timolol (TIMOPTIC) 0.5 % ophthalmic solution Place 1 drop into both eyes daily.     No current facility-administered medications on file prior to visit.   Allergies  Allergen Reactions  . Amlodipine Swelling    swelling in ankles  . Shellfish Allergy Other (See Comments)    N&V AFTER EATING MUSSELS   Social History   Socioeconomic History  . Marital status: Divorced    Spouse name: Not on file  . Number of children: Not on file  . Years of education: Not on file  . Highest education level: Bachelor's degree (e.g., BA, AB, BS)  Occupational History  . Not on file  Tobacco Use  . Smoking status: Former Smoker    Years: 10.00    Quit date: 02/22/1992    Years since quitting: 28.2  . Smokeless tobacco: Never Used  . Tobacco comment: QUIT SMOKING ABOUT 1994 - 1 PP WEEK  Vaping Use  . Vaping Use: Never used  Substance and Sexual Activity  . Alcohol use: Yes    Comment: 2 GLASSES WINE DAILY  . Drug use: No  . Sexual activity: Not on file  Other Topics Concern  . Not on file  Social History Narrative  . Not on file   Social Determinants of Health   Financial Resource Strain: Not on file  Food Insecurity: Not on file  Transportation Needs: Not on file  Physical Activity: Not on file  Stress: Not on file  Social Connections: Not on file  Intimate Partner Violence: Not on file      Review of Systems  All other systems reviewed and are negative.      Objective:   Physical Exam Vitals reviewed.  Constitutional:      General: He is not in acute distress.    Appearance: He is well-developed. He is not diaphoretic.  HENT:     Head: Normocephalic and atraumatic.     Right Ear: External ear normal.     Left Ear: External ear normal.     Nose: Nose  normal.     Mouth/Throat:     Pharynx: No oropharyngeal exudate.  Eyes:     General: No scleral icterus.       Right eye: No discharge.        Left eye: No discharge.     Conjunctiva/sclera: Conjunctivae normal.     Pupils: Pupils are equal, round, and reactive to light.  Neck:     Thyroid: No thyromegaly.     Vascular: No JVD.     Trachea: No tracheal deviation.  Cardiovascular:     Rate and Rhythm: Normal rate and regular rhythm.     Heart sounds: Normal heart sounds. No murmur heard. No friction rub. No gallop.   Pulmonary:     Effort: Pulmonary effort is  normal. No respiratory distress.     Breath sounds: Normal breath sounds. No stridor. No wheezing or rales.  Chest:     Chest wall: No tenderness.  Abdominal:     General: Bowel sounds are normal. There is no distension.     Palpations: Abdomen is soft. There is no mass.     Tenderness: There is no abdominal tenderness. There is no guarding or rebound.  Musculoskeletal:        General: No tenderness or deformity. Normal range of motion.     Cervical back: Normal range of motion and neck supple.  Lymphadenopathy:     Cervical: No cervical adenopathy.  Skin:    General: Skin is warm.     Coloration: Skin is not pale.     Findings: No erythema or rash.  Neurological:     Mental Status: He is alert and oriented to person, place, and time.     Cranial Nerves: No cranial nerve deficit.     Motor: No abnormal muscle tone.     Coordination: Coordination normal.     Deep Tendon Reflexes: Reflexes are normal and symmetric.  Psychiatric:        Behavior: Behavior normal.        Thought Content: Thought content normal.        Judgment: Judgment normal.           Assessment & Plan:  Prostate cancer screening - Plan: PSA  Essential hypertension  Pure hypercholesterolemia  S/P CABG x 4  General medical exam  Overall, I think the patient is doing remarkable.  He is planning on running a 5K.  He does have some mild  anemia which I believe could be due to the elevation in his creatinine as well as likely iron deficiency.  He is already had an EGD and a colonoscopy last year.  Therefore I recommended trying an iron supplement in addition to his B12.  Cholesterol is excellent.  Check PSA to screen for prostate cancer.  Immunizations are up-to-date.  He denies any falls, depression, or memory loss.  Blood pressure today is excellent at 128/60. Appointment on 04/15/2020  Component Date Value Ref Range Status  . Hepatitis C Ab 04/15/2020 NON-REACTIVE  NON-REACTI Final  . SIGNAL TO CUT-OFF 04/15/2020 0.01  <1.00 Final   Comment: . HCV antibody was non-reactive. There is no laboratory  evidence of HCV infection. . In most cases, no further action is required. However, if recent HCV exposure is suspected, a test for HCV RNA (test code [phone]) is suggested. . For additional information please refer to http://education.questdiagnostics.com/faq/FAQ22v1 (This link is being provided for informational/ educational purposes only.) .   Marland Kitchen Glucose, Bld 04/15/2020 94  65 - 99 mg/dL Final   Comment: .            Fasting reference interval .   . BUN 04/15/2020 21  7 - 25 mg/dL Final  . Creat 04/15/2020 1.26* 0.70 - 1.18 mg/dL Final   Comment: For patients >44 years of age, the reference limit for Creatinine is approximately 13% higher for people identified as African-American. .   . GFR, Est Non African American 04/15/2020 54* > OR = 60 mL/min/1.40m2 Final  . GFR, Est African American 04/15/2020 62  > OR = 60 mL/min/1.40m2 Final  . BUN/Creatinine Ratio 04/15/2020 17  6 - 22 (calc) Final  . Sodium 04/15/2020 140  135 - 146 mmol/L Final  . Potassium 04/15/2020 4.5  3.5 - 5.3 mmol/L  Final  . Chloride 04/15/2020 104  98 - 110 mmol/L Final  . CO2 04/15/2020 28  20 - 32 mmol/L Final  . Calcium 04/15/2020 9.0  8.6 - 10.3 mg/dL Final  . Total Protein 04/15/2020 6.3  6.1 - 8.1 g/dL Final  . Albumin 04/15/2020 4.0  3.6 -  5.1 g/dL Final  . Globulin 04/15/2020 2.3  1.9 - 3.7 g/dL (calc) Final  . AG Ratio 04/15/2020 1.7  1.0 - 2.5 (calc) Final  . Total Bilirubin 04/15/2020 0.4  0.2 - 1.2 mg/dL Final  . Alkaline phosphatase (APISO) 04/15/2020 55  35 - 144 U/L Final  . AST 04/15/2020 21  10 - 35 U/L Final  . ALT 04/15/2020 18  9 - 46 U/L Final  . WBC 04/15/2020 5.0  3.8 - 10.8 Thousand/uL Final  . RBC 04/15/2020 3.94* 4.20 - 5.80 Million/uL Final  . Hemoglobin 04/15/2020 12.2* 13.2 - 17.1 g/dL Final  . HCT 04/15/2020 37.6* 38.5 - 50.0 % Final  . MCV 04/15/2020 95.4  80.0 - 100.0 fL Final  . MCH 04/15/2020 31.0  27.0 - 33.0 pg Final  . MCHC 04/15/2020 32.4  32.0 - 36.0 g/dL Final  . RDW 04/15/2020 13.4  11.0 - 15.0 % Final  . Platelets 04/15/2020 207  140 - 400 Thousand/uL Final  . MPV 04/15/2020 10.0  7.5 - 12.5 fL Final  . Neutro Abs 04/15/2020 2,110  1,500 - 7,800 cells/uL Final  . Lymphs Abs 04/15/2020 1,650  850 - 3,900 cells/uL Final  . Absolute Monocytes 04/15/2020 725  200 - 950 cells/uL Final  . Eosinophils Absolute 04/15/2020 425  15 - 500 cells/uL Final  . Basophils Absolute 04/15/2020 90  0 - 200 cells/uL Final  . Neutrophils Relative % 04/15/2020 42.2  % Final  . Total Lymphocyte 04/15/2020 33.0  % Final  . Monocytes Relative 04/15/2020 14.5  % Final  . Eosinophils Relative 04/15/2020 8.5  % Final  . Basophils Relative 04/15/2020 1.8  % Final  Lab on 03/10/2020  Component Date Value Ref Range Status  . Cholesterol, Total 03/10/2020 141  100 - 199 mg/dL Final  . Triglycerides 03/10/2020 134  0 - 149 mg/dL Final  . HDL 03/10/2020 52  >39 mg/dL Final  . VLDL Cholesterol Cal 03/10/2020 23  5 - 40 mg/dL Final  . LDL Chol Calc (NIH) 03/10/2020 66  0 - 99 mg/dL Final  . Chol/HDL Ratio 03/10/2020 2.7  0.0 - 5.0 ratio Final   Comment:                                   T. Chol/HDL Ratio                                             Men  Women                               1/2 Avg.Risk  3.4    3.3                                    Avg.Risk  5.0    4.4  2X Avg.Risk  9.6    7.1                                3X Avg.Risk 23.4   11.0

## 2020-05-01 LAB — PSA: PSA: 0.34 ng/mL (ref <=4.0)

## 2020-05-15 ENCOUNTER — Telehealth: Payer: Self-pay | Admitting: Family Medicine

## 2020-05-15 MED ORDER — ESOMEPRAZOLE MAGNESIUM 40 MG PO CPDR: 40.0000 mg | DELAYED_RELEASE_CAPSULE | Freq: Every day | ORAL | 3 refills | Status: DC

## 2020-05-15 NOTE — Telephone Encounter (Signed)
Pt came in today asking to get a refill of  esomeprazole (NEXIUM) 40 MG capsule  Cb#: [phone]

## 2020-05-15 NOTE — Telephone Encounter (Signed)
Prescription sent to pharmacy.

## 2020-06-09 ENCOUNTER — Telehealth: Payer: Self-pay | Admitting: Family Medicine

## 2020-06-09 MED ORDER — DOXAZOSIN MESYLATE 2 MG PO TABS: 2.0000 mg | ORAL_TABLET | Freq: Every day | ORAL | 3 refills | Status: DC

## 2020-06-09 NOTE — Telephone Encounter (Signed)
Prescription sent to pharmacy.

## 2020-06-09 NOTE — Telephone Encounter (Signed)
Pt came in wanting to know about getting his refill approved for  doxazosin (CARDURA) 2 MG tablet [[id number]]  cb [phone]

## 2020-06-10 DIAGNOSIS — C44319 Basal cell carcinoma of skin of other parts of face: Secondary | ICD-10-CM | POA: Diagnosis not present

## 2020-06-30 ENCOUNTER — Other Ambulatory Visit: Payer: Self-pay

## 2020-06-30 ENCOUNTER — Telehealth: Payer: Self-pay

## 2020-06-30 ENCOUNTER — Ambulatory Visit (INDEPENDENT_AMBULATORY_CARE_PROVIDER_SITE_OTHER): Payer: Medicare PPO | Admitting: Family Medicine

## 2020-06-30 VITALS — BP 122/58

## 2020-06-30 DIAGNOSIS — Z0130 Encounter for examination of blood pressure without abnormal findings: Secondary | ICD-10-CM

## 2020-06-30 NOTE — Progress Notes (Signed)
Pt came in for bp chk, concerned of diastolic number being in the 50's. Pt made appt with NP for follow up

## 2020-07-01 NOTE — Telephone Encounter (Signed)
Spoke with pt, says he is feeling better. May not keep the appt for Friday

## 2020-07-03 ENCOUNTER — Ambulatory Visit: Payer: Medicare PPO | Admitting: Nurse Practitioner

## 2020-07-15 DIAGNOSIS — Z85828 Personal history of other malignant neoplasm of skin: Secondary | ICD-10-CM | POA: Diagnosis not present

## 2020-07-15 DIAGNOSIS — Z08 Encounter for follow-up examination after completed treatment for malignant neoplasm: Secondary | ICD-10-CM | POA: Diagnosis not present

## 2020-08-21 DIAGNOSIS — N5201 Erectile dysfunction due to arterial insufficiency: Secondary | ICD-10-CM | POA: Diagnosis not present

## 2020-08-21 DIAGNOSIS — R350 Frequency of micturition: Secondary | ICD-10-CM | POA: Diagnosis not present

## 2020-08-21 DIAGNOSIS — N401 Enlarged prostate with lower urinary tract symptoms: Secondary | ICD-10-CM | POA: Diagnosis not present

## 2020-09-01 DIAGNOSIS — H2513 Age-related nuclear cataract, bilateral: Secondary | ICD-10-CM | POA: Diagnosis not present

## 2020-09-01 DIAGNOSIS — H43813 Vitreous degeneration, bilateral: Secondary | ICD-10-CM | POA: Diagnosis not present

## 2020-09-01 DIAGNOSIS — H401131 Primary open-angle glaucoma, bilateral, mild stage: Secondary | ICD-10-CM | POA: Diagnosis not present

## 2020-09-01 DIAGNOSIS — H04123 Dry eye syndrome of bilateral lacrimal glands: Secondary | ICD-10-CM | POA: Diagnosis not present

## 2020-09-23 DIAGNOSIS — I10 Essential (primary) hypertension: Secondary | ICD-10-CM | POA: Diagnosis not present

## 2020-09-23 DIAGNOSIS — I2510 Atherosclerotic heart disease of native coronary artery without angina pectoris: Secondary | ICD-10-CM | POA: Diagnosis not present

## 2020-09-23 DIAGNOSIS — I252 Old myocardial infarction: Secondary | ICD-10-CM | POA: Diagnosis not present

## 2020-09-23 DIAGNOSIS — H409 Unspecified glaucoma: Secondary | ICD-10-CM | POA: Diagnosis not present

## 2020-09-23 DIAGNOSIS — E785 Hyperlipidemia, unspecified: Secondary | ICD-10-CM | POA: Diagnosis not present

## 2020-09-23 DIAGNOSIS — I739 Peripheral vascular disease, unspecified: Secondary | ICD-10-CM | POA: Diagnosis not present

## 2020-09-23 DIAGNOSIS — K219 Gastro-esophageal reflux disease without esophagitis: Secondary | ICD-10-CM | POA: Diagnosis not present

## 2020-09-23 DIAGNOSIS — F1010 Alcohol abuse, uncomplicated: Secondary | ICD-10-CM | POA: Diagnosis not present

## 2020-09-23 DIAGNOSIS — K5900 Constipation, unspecified: Secondary | ICD-10-CM | POA: Diagnosis not present

## 2020-10-01 ENCOUNTER — Other Ambulatory Visit: Payer: Self-pay | Admitting: Family Medicine

## 2020-10-12 DIAGNOSIS — L570 Actinic keratosis: Secondary | ICD-10-CM | POA: Diagnosis not present

## 2020-10-12 DIAGNOSIS — L821 Other seborrheic keratosis: Secondary | ICD-10-CM | POA: Diagnosis not present

## 2020-10-12 DIAGNOSIS — X32XXXD Exposure to sunlight, subsequent encounter: Secondary | ICD-10-CM | POA: Diagnosis not present

## 2020-10-12 DIAGNOSIS — Z1283 Encounter for screening for malignant neoplasm of skin: Secondary | ICD-10-CM | POA: Diagnosis not present

## 2020-11-20 ENCOUNTER — Other Ambulatory Visit: Payer: Self-pay | Admitting: Cardiology

## 2020-11-28 ENCOUNTER — Other Ambulatory Visit: Payer: Self-pay | Admitting: Cardiology

## 2020-12-18 ENCOUNTER — Other Ambulatory Visit: Payer: Self-pay | Admitting: Cardiology

## 2020-12-23 DIAGNOSIS — H903 Sensorineural hearing loss, bilateral: Secondary | ICD-10-CM | POA: Diagnosis not present

## 2021-01-05 DIAGNOSIS — H903 Sensorineural hearing loss, bilateral: Secondary | ICD-10-CM | POA: Diagnosis not present

## 2021-02-02 DIAGNOSIS — H3562 Retinal hemorrhage, left eye: Secondary | ICD-10-CM | POA: Diagnosis not present

## 2021-02-02 DIAGNOSIS — H2513 Age-related nuclear cataract, bilateral: Secondary | ICD-10-CM | POA: Diagnosis not present

## 2021-02-02 DIAGNOSIS — H401131 Primary open-angle glaucoma, bilateral, mild stage: Secondary | ICD-10-CM | POA: Diagnosis not present

## 2021-02-18 ENCOUNTER — Ambulatory Visit (INDEPENDENT_AMBULATORY_CARE_PROVIDER_SITE_OTHER): Payer: Medicare PPO | Admitting: Family Medicine

## 2021-02-18 ENCOUNTER — Other Ambulatory Visit: Payer: Self-pay

## 2021-02-18 VITALS — BP 110/70 | HR 82 | Wt 174.0 lb

## 2021-02-18 DIAGNOSIS — M5136 Other intervertebral disc degeneration, lumbar region: Secondary | ICD-10-CM

## 2021-02-18 DIAGNOSIS — M5432 Sciatica, left side: Secondary | ICD-10-CM

## 2021-02-18 MED ORDER — PREDNISONE 20 MG PO TABS: ORAL_TABLET | ORAL | 0 refills | Status: DC

## 2021-02-18 NOTE — Progress Notes (Signed)
Subjective:    Patient ID: Alex Martin, male    DOB: [birth year], 81 y.o.   MRN: [id number]  I originally saw the patient for lower back pain and left-sided sciatica in 2021.  At that time I performed an x-ray the results of which are included below: Advanced degenerative facet disease throughout the lumbar spine, most pronounced in the lower lumbar spine. 7 mm of anterolisthesis of L4 on L5. Degenerative spurring in the lower thoracic spine. Disc space narrowing at L5-S1 and L1-2 with early vacuum disc. Slight wedged appearance of the T12 vertebral body could reflect mild old compression fracture, stable since chest x-ray from 03/05/2018  Patient states that once or twice a week he will have pain originating in his left lower back roughly around the level of L4.  It would radiate into his superior posterior gluteus and occasionally he will have some numbness and tingling radiating down his anterior left thigh.  It usually occurs when he is standing from a seated position such as getting out of his car.  Symptoms will last 2 to 3 minutes.  At times hard to walk on his left leg due to feeling numb.  Then the symptoms will subside.  Again this occurs 1-2 times a week for less than a few minutes.  Otherwise he is doing well and is relatively active and is exercising.  He denies any bowel or bladder incontinence.  He denies any saddle anesthesia.  He has a negative straight leg raise bilaterally today and normal range of motion in both hips. Past Medical History:  Diagnosis Date   Abdominal distension    UMBILICAL HERNIA-NO PAIN   Bleeding from the nose    SEVERAL EPISODES THAT REQUIRED TX IN ER--LAST EPISODE WAS APPROX 1 YR AGO   Blood in urine    MICROSCOPIC BLOODEVALUATED BY DR. NESI FRIDAY 02/11/11--URINE SAMPLE AND EXAM-- AND BLOOD SAMPLE AND TO BE SEEN AGAIN IN 6 MONTHS AND GIVEN VESICARE  FOR FREQUENCY AND URGENCY   Coronary artery disease    GERD (gastroesophageal reflux disease)     Hyperlipidemia    Hypertension    Past Surgical History:  Procedure Laterality Date   CARDIAC CATHETERIZATION     CORONARY ARTERY BYPASS GRAFT N/A 02/05/2018   Procedure: CORONARY ARTERY BYPASS GRAFTING (CABG) x4 USING BILATERAL IMA'S WITH LEFT GREATER SAPHENOUS VEIN HARVEST AND EXPLORATION OF RIGHT SAPHENOUS;  Surgeon: Gaye Pollack, MD;  Location: [facility];  Service: Open Heart Surgery;  Laterality: N/A;   HERNIA REPAIR  1959   [facility]   LEFT HEART CATH AND CORONARY ANGIOGRAPHY N/A 02/02/2018   Procedure: LEFT HEART CATH AND CORONARY ANGIOGRAPHY;  Surgeon: Martinique, Peter M, MD;  Location: [facility];  Service: Cardiovascular;  Laterality: N/A;   TEE WITHOUT CARDIOVERSION N/A 02/05/2018   Procedure: TRANSESOPHAGEAL ECHOCARDIOGRAM (TEE);  Surgeon: Gaye Pollack, MD;  Location: [facility];  Service: Open Heart Surgery;  Laterality: N/A;   UMBILICAL HERNIA REPAIR  02/15/2011   Procedure: HERNIA REPAIR UMBILICAL ADULT;  Surgeon: Odis Hollingshead, MD;  Location: [facility];  Service: General;  Laterality: N/A;  umbilical hernia repair with mesh   VASECTOMY  1976   Current Outpatient Medications on File Prior to Visit  Medication Sig Dispense Refill   aspirin EC 81 MG tablet Take 81 mg by mouth daily.     Cyanocobalamin (VITAMIN B-12 PO) Take 1 tablet by mouth daily.     doxazosin (CARDURA) 2 MG tablet TAKE 1  TABLET BY MOUTH EVERY DAY 90 tablet 1   esomeprazole (NEXIUM) 40 MG capsule Take 1 capsule (40 mg total) by mouth daily. 90 capsule 3   hydrochlorothiazide (HYDRODIURIL) 25 MG tablet TAKE 1 TABLET BY MOUTH EVERY DAY 90 tablet 3   latanoprost (XALATAN) 0.005 % ophthalmic solution Place 1 drop into both eyes at bedtime.   0   losartan (COZAAR) 100 MG tablet Take 1 tablet (100 mg total) by mouth daily. Please keep upcoming appt in February 2023 with Cardiologist before anymore refills. Thank you 90 tablet 0   metoprolol succinate (TOPROL-XL) 25 MG 24 hr tablet TAKE 1 TABLET BY MOUTH EVERY DAY 90  tablet 3   Multiple Vitamin (MULTIVITAMIN) tablet Take 1 tablet by mouth daily.     PAXLOVID, 300/100, 20 x 150 MG & 10 x 100MG  TBPK Take by mouth.     rosuvastatin (CRESTOR) 40 MG tablet TAKE 1 TABLET BY MOUTH EVERY DAY 90 tablet 3   solifenacin (VESICARE) 5 MG tablet Take 5 mg by mouth daily.     timolol (TIMOPTIC) 0.5 % ophthalmic solution Place 1 drop into both eyes daily.     No current facility-administered medications on file prior to visit.   Allergies  Allergen Reactions   Amlodipine Swelling    swelling in ankles   Shellfish Allergy Other (See Comments)    N&V AFTER EATING MUSSELS   Social History   Socioeconomic History   Marital status: Divorced    Spouse name: Not on file   Number of children: Not on file   Years of education: Not on file   Highest education level: Bachelor's degree (e.g., BA, AB, BS)  Occupational History   Not on file  Tobacco Use   Smoking status: Former    Years: 10.00    Types: Cigarettes    Quit date: 02/22/1992    Years since quitting: 29.0   Smokeless tobacco: Never   Tobacco comments:    QUIT SMOKING ABOUT 1994 - 1 PP WEEK  Vaping Use   Vaping Use: Never used  Substance and Sexual Activity   Alcohol use: Yes    Comment: 2 GLASSES WINE DAILY   Drug use: No   Sexual activity: Not on file  Other Topics Concern   Not on file  Social History Narrative   Not on file   Social Determinants of Health   Financial Resource Strain: Not on file  Food Insecurity: Not on file  Transportation Needs: Not on file  Physical Activity: Not on file  Stress: Not on file  Social Connections: Not on file  Intimate Partner Violence: Not on file      Review of Systems  Musculoskeletal:  Positive for back pain.  All other systems reviewed and are negative.      Objective:   Physical Exam Vitals reviewed.  Constitutional:      General: He is not in acute distress.    Appearance: He is well-developed. He is not diaphoretic.  Neck:      Thyroid: No thyromegaly.     Trachea: No tracheal deviation.  Cardiovascular:     Rate and Rhythm: Normal rate and regular rhythm.     Heart sounds: Normal heart sounds. No murmur heard.   No friction rub. No gallop.  Pulmonary:     Effort: Pulmonary effort is normal. No respiratory distress.     Breath sounds: Normal breath sounds. No wheezing or rales.  Chest:     Chest wall: No  tenderness.  Abdominal:     General: Bowel sounds are normal.     Palpations: Abdomen is soft.  Musculoskeletal:     Cervical back: Normal range of motion and neck supple.     Lumbar back: No spasms, tenderness or bony tenderness. Decreased range of motion.       Back:     Right hip: No tenderness or bony tenderness. Normal range of motion.     Left hip: No tenderness or bony tenderness. Normal range of motion.  Neurological:     Mental Status: He is alert and oriented to person, place, and time.     Deep Tendon Reflexes: Reflexes are normal and symmetric.          Assessment & Plan:  Left sided sciatica  DDD (degenerative disc disease), lumbar I reviewed his images from his x-ray from 2021.  We discussed sciatica.  At the present time he is not interested in any cortisone injections, facet joint injections, or physical therapy.  The pain is not sufficient to require surgery.  Therefore I recommended symptomatic treatment with a prednisone taper pack and his symptoms are mild and intermittent, the patient chooses just to live with them at the present time.  If worsening I would proceed with an MRI.

## 2021-02-22 ENCOUNTER — Encounter: Payer: Self-pay | Admitting: Physician Assistant

## 2021-02-22 DIAGNOSIS — I779 Disorder of arteries and arterioles, unspecified: Secondary | ICD-10-CM

## 2021-02-22 DIAGNOSIS — I255 Ischemic cardiomyopathy: Secondary | ICD-10-CM

## 2021-02-22 DIAGNOSIS — I2510 Atherosclerotic heart disease of native coronary artery without angina pectoris: Secondary | ICD-10-CM

## 2021-02-22 HISTORY — DX: Disorder of arteries and arterioles, unspecified: I77.9

## 2021-02-22 HISTORY — DX: Atherosclerotic heart disease of native coronary artery without angina pectoris: I25.10

## 2021-02-22 HISTORY — DX: Ischemic cardiomyopathy: I25.5

## 2021-02-22 NOTE — Progress Notes (Signed)
Cardiology Office Note:    Date:  02/23/2021   ID:  Alex Martin, DOB 14-Jul-1940, MRN [id number]  PCP:  Susy Frizzle, MD  [facility] Providers Cardiologist:  Candee Furbish, MD    Referring MD: Susy Frizzle, MD   Chief Complaint:  F/u for CAD    Patient Profile: Specialty Problems       Cardiology Problems   Essential hypertension   Old MI (myocardial infarction)   Dyslipidemia, goal LDL below 49   CAD (coronary artery disease)    NSTEMI in 01/2018 s/p CABG Post CABG AFib      Carotid artery disease ([place])    Pre CABG Dopplers 02/02/2018:  Bilat ICA 1-39      Ischemic cardiomyopathy    EF ~ 30 >> Improved to normal  Echocardiogram 06/13/2018:  EF 60-65, mild LVH, Gr 1 DD, no RWMA, normla RVSF, severe LAE, mild AI Echocardiogram 02/02/2018:  Mod LVH, EF 30-35, WMA c/w LAD infarct, normal RVSF, mild pulmonary hypertension       Aortic insufficiency    Echocardiogram 06/13/2018:  EF 60-65, mild LVH, Gr 1 DD, no RWMA, normla RVSF, severe LAE, mild AI       Prior CV Studies: Echocardiogram 06/13/2018 EF 60-65, mild LVH, Gr 1 DD, no RWMA, normla RVSF, severe LAE, mild AI  Pre CABG Dopplers 02/02/2018 Bilat ICA 1-39  Echocardiogram 02/02/2018 Mod LVH, EF 30-35, WMA c/w LAD infarct, normal RVSF, mild pulmonary hypertension   Cardiac catheterization 02/02/2018 1. Severe 3 vessel CAD with some distal left main disease as well 2. Severe LV dysfunction EF 25-35 3. Moderately elevated LVEDP    History of Present Illness:   Alex Martin is a 81 y.o. male with the above problem list.  He was last seen by Dr. Marlou Porch in 11/21.  He returns for f/u.  He is here alone.  He is overall doing well.  He has not had chest pain, shortness of breath, syncope, orthopnea, leg edema.  He plays pickle ball and works out at [employer] several times a week.        Past Medical History:  Diagnosis Date   Abdominal distension    UMBILICAL HERNIA-NO PAIN   Aortic insufficiency  02/23/2021   Echocardiogram 06/13/2018:  EF 60-65, mild LVH, Gr 1 DD, no RWMA, normla RVSF, severe LAE, mild AI   Bleeding from the nose    SEVERAL EPISODES THAT REQUIRED TX IN ER--LAST EPISODE WAS APPROX 1 YR AGO   Blood in urine    MICROSCOPIC BLOODEVALUATED BY DR. NESI FRIDAY 02/11/11--URINE SAMPLE AND EXAM-- AND BLOOD SAMPLE AND TO BE SEEN AGAIN IN 6 MONTHS AND GIVEN VESICARE  FOR FREQUENCY AND URGENCY   CAD (coronary artery disease) 02/22/2021   NSTEMI in 01/2018 s/p CABG Post CABG AFib   Carotid artery disease ([place]) 02/22/2021   Pre CABG Dopplers 02/02/2018:  Bilat ICA 1-39   Coronary artery disease    GERD (gastroesophageal reflux disease)    Hyperlipidemia    Hypertension    Ischemic cardiomyopathy 02/22/2021   EF ~ 30 >> Improved to normal  Echocardiogram 06/13/2018:  EF 60-65, mild LVH, Gr 1 DD, no RWMA, normla RVSF, severe LAE, mild AI Echocardiogram 02/02/2018:  Mod LVH, EF 30-35, WMA c/w LAD infarct, normal RVSF, mild pulmonary hypertension    Current Medications: Current Meds  Medication Sig   aspirin EC 81 MG tablet Take 81 mg by mouth daily.   Cyanocobalamin (VITAMIN B-12 PO) Take  1 tablet by mouth daily.   doxazosin (CARDURA) 2 MG tablet TAKE 1 TABLET BY MOUTH EVERY DAY   esomeprazole (NEXIUM) 40 MG capsule Take 1 capsule (40 mg total) by mouth daily.   hydrochlorothiazide (HYDRODIURIL) 25 MG tablet TAKE 1 TABLET BY MOUTH EVERY DAY   latanoprost (XALATAN) 0.005 % ophthalmic solution Place 1 drop into both eyes at bedtime.    losartan (COZAAR) 100 MG tablet Take 1 tablet (100 mg total) by mouth daily. Please keep upcoming appt in February 2023 with Cardiologist before anymore refills. Thank you   metoprolol succinate (TOPROL-XL) 25 MG 24 hr tablet TAKE 1 TABLET BY MOUTH EVERY DAY   Multiple Vitamin (MULTIVITAMIN) tablet Take 1 tablet by mouth daily.   rosuvastatin (CRESTOR) 40 MG tablet TAKE 1 TABLET BY MOUTH EVERY DAY   solifenacin (VESICARE) 5 MG tablet Take 5 mg by mouth daily.    timolol (TIMOPTIC) 0.5 % ophthalmic solution Place 1 drop into both eyes daily.    Allergies:   Amlodipine and Shellfish allergy   Social History   Tobacco Use   Smoking status: Former    Years: 10.00    Types: Cigarettes    Quit date: 02/22/1992    Years since quitting: 29.0   Smokeless tobacco: Never   Tobacco comments:    QUIT SMOKING ABOUT 1994 - 1 PP WEEK  Vaping Use   Vaping Use: Never used  Substance Use Topics   Alcohol use: Yes    Comment: 2 GLASSES WINE DAILY   Drug use: No    Family Hx: The patient's family history includes Cancer in his sister; Heart disease in his father and mother. There is no history of Esophageal cancer, Stomach cancer, or Pancreatic cancer.  Review of Systems  Cardiovascular:  Negative for claudication.    EKGs/Labs/Other Test Reviewed:    EKG:  EKG is  ordered today.  The ekg ordered today demonstrates NSR, HR 70, normal axis, first-degree AV block, PR 206, QTc 397, no ST-T wave changes, no change from prior tracing  Recent Labs: 04/15/2020: ALT 18; BUN 21; Creat 1.26; Hemoglobin 12.2; Platelets 207; Potassium 4.5; Sodium 140   Recent Lipid Panel Recent Labs    03/10/20 0746  CHOL 141  TRIG 134  HDL 52  LDLCALC 66     Risk Assessment/Calculations:         Physical Exam:    VS:  BP (!) 112/50 (BP Location: Right Arm, Patient Position: Sitting, Cuff Size: Normal)    Pulse 70    Ht 5\' 8"  (1.727 m)    Wt 177 lb 3.2 oz (80.4 kg)    SpO2 96%    BMI 26.94 kg/m     Wt Readings from Last 3 Encounters:  02/23/21 177 lb 3.2 oz (80.4 kg)  02/18/21 174 lb (78.9 kg)  04/30/20 176 lb (79.8 kg)    Constitutional:      Appearance: Healthy appearance. Not in distress.  Neck:     Vascular: No JVR. JVD normal.  Pulmonary:     Effort: Pulmonary effort is normal.     Breath sounds: No wheezing. No rales.  Cardiovascular:     Normal rate. Regular rhythm. Normal S1. Normal S2.      Murmurs: There is no murmur.  Edema:    Peripheral edema  absent.  Abdominal:     Palpations: Abdomen is soft.  Skin:    General: Skin is warm and dry.  Neurological:     General: No  focal deficit present.     Mental Status: Alert and oriented to person, place and time.     Cranial Nerves: Cranial nerves are intact.        ASSESSMENT & PLAN:   CAD (coronary artery disease) History of non-STEMI followed by CABG in 2020.  He is doing well without anginal symptoms.  Continue aspirin 81 mg daily, metoprolol succinate 25 mg daily, rosuvastatin 40 mg daily.  Follow-up in 1 year.  Ischemic cardiomyopathy EF at the time of his myocardial infarction in 2020 was 30%.  This improved to normal.  He is doing well without symptoms to suggest volume excess/CHF.  Continue losartan 100 mg daily, metoprolol succinate 25 mg daily.  Essential hypertension Blood pressure is well controlled.  Continue Zosyn 2 mg daily, HCTZ 25 mg daily, losartan 100 mg daily, metoprolol succinate 25 mg daily.  Dyslipidemia, goal LDL below 70 Goal LDL is <70.  Continue rosuvastatin 40 mg daily.  He has labs pending with primary care in April.          Dispo:  Return in about 1 year (around 02/23/2022) for Routine Follow Up with Dr. Marlou Porch.   Medication Adjustments/Labs and Tests Ordered: Current medicines are reviewed at length with the patient today.  Concerns regarding medicines are outlined above.  Tests Ordered: Orders Placed This Encounter  Procedures   EKG 12-Lead   Medication Changes: No orders of the defined types were placed in this encounter.  Signed, Richardson Dopp, PA-C  02/23/2021 9:46 AM    [facility] [address], [place], [place]  [address] Phone: [phone]; Fax: [phone]

## 2021-02-23 ENCOUNTER — Ambulatory Visit: Payer: Medicare PPO | Admitting: Physician Assistant

## 2021-02-23 ENCOUNTER — Other Ambulatory Visit: Payer: Self-pay

## 2021-02-23 ENCOUNTER — Encounter: Payer: Self-pay | Admitting: Physician Assistant

## 2021-02-23 VITALS — BP 112/50 | HR 70 | Ht 68.0 in | Wt 177.2 lb

## 2021-02-23 DIAGNOSIS — I2510 Atherosclerotic heart disease of native coronary artery without angina pectoris: Secondary | ICD-10-CM | POA: Diagnosis not present

## 2021-02-23 DIAGNOSIS — I10 Essential (primary) hypertension: Secondary | ICD-10-CM | POA: Diagnosis not present

## 2021-02-23 DIAGNOSIS — I6523 Occlusion and stenosis of bilateral carotid arteries: Secondary | ICD-10-CM

## 2021-02-23 DIAGNOSIS — I351 Nonrheumatic aortic (valve) insufficiency: Secondary | ICD-10-CM

## 2021-02-23 DIAGNOSIS — E785 Hyperlipidemia, unspecified: Secondary | ICD-10-CM | POA: Diagnosis not present

## 2021-02-23 DIAGNOSIS — I255 Ischemic cardiomyopathy: Secondary | ICD-10-CM

## 2021-02-23 HISTORY — DX: Nonrheumatic aortic (valve) insufficiency: I35.1

## 2021-02-23 NOTE — Assessment & Plan Note (Signed)
Goal LDL is <70.  Continue rosuvastatin 40 mg daily.  He has labs pending with primary care in April.

## 2021-02-23 NOTE — Assessment & Plan Note (Signed)
History of non-STEMI followed by CABG in 2020.  He is doing well without anginal symptoms.  Continue aspirin 81 mg daily, metoprolol succinate 25 mg daily, rosuvastatin 40 mg daily.  Follow-up in 1 year.

## 2021-02-23 NOTE — Patient Instructions (Signed)
Medication Instructions:   Your physician recommends that you continue on your current medications as directed. Please refer to the Current Medication list given to you today.  *If you need a refill on your cardiac medications before your next appointment, please call your pharmacy*   Lab Work:  None ordered.  If you have labs (blood work) drawn today and your tests are completely normal, you will receive your results only by: [place] (if you have MyChart) OR A paper copy in the mail If you have any lab test that is abnormal or we need to change your treatment, we will call you to review the results.   Testing/Procedures:  None Ordered.   Follow-Up: At [facility], you and your health needs are our priority.  As part of our continuing mission to provide you with exceptional heart care, we have created designated Provider Care Teams.  These Care Teams include your primary Cardiologist (physician) and Advanced Practice Providers (APPs -  Physician Assistants and Nurse Practitioners) who all work together to provide you with the care you need, when you need it.  We recommend signing up for the patient portal called "MyChart".  Sign up information is provided on this After Visit Summary.  MyChart is used to connect with patients for Virtual Visits (Telemedicine).  Patients are able to view lab/test results, encounter notes, upcoming appointments, etc.  Non-urgent messages can be sent to your provider as well.   To learn more about what you can do with MyChart, go to [email or web address].    Your next appointment:   1 year(s)  The format for your next appointment:   In Person  Provider:   Candee Furbish, MD     Other Instructions  Your physician wants you to follow-up in: 1 year with Dr.Skains.  You will receive a reminder letter in the mail two months in advance. If you don't receive a letter, please call our office to schedule the follow-up appointment.

## 2021-02-23 NOTE — Assessment & Plan Note (Signed)
Blood pressure is well controlled.  Continue Zosyn 2 mg daily, HCTZ 25 mg daily, losartan 100 mg daily, metoprolol succinate 25 mg daily.

## 2021-02-23 NOTE — Assessment & Plan Note (Signed)
EF at the time of his myocardial infarction in 2020 was 30%.  This improved to normal.  He is doing well without symptoms to suggest volume excess/CHF.  Continue losartan 100 mg daily, metoprolol succinate 25 mg daily.

## 2021-03-11 ENCOUNTER — Other Ambulatory Visit: Payer: Self-pay | Admitting: Cardiology

## 2021-04-02 ENCOUNTER — Other Ambulatory Visit: Payer: Self-pay | Admitting: Family Medicine

## 2021-04-04 ENCOUNTER — Emergency Department (HOSPITAL_COMMUNITY)
Admission: EM | Admit: 2021-04-04 | Discharge: 2021-04-05 | Disposition: A | Discharge status: Home / Self Care | Payer: Medicare PPO | Attending: Emergency Medicine | Admitting: Emergency Medicine

## 2021-04-04 ENCOUNTER — Other Ambulatory Visit: Payer: Self-pay

## 2021-04-04 ENCOUNTER — Encounter (HOSPITAL_COMMUNITY): Payer: Self-pay

## 2021-04-04 DIAGNOSIS — R040 Epistaxis: Secondary | ICD-10-CM | POA: Insufficient documentation

## 2021-04-04 DIAGNOSIS — Z7982 Long term (current) use of aspirin: Secondary | ICD-10-CM | POA: Insufficient documentation

## 2021-04-04 DIAGNOSIS — W07XXXA Fall from chair, initial encounter: Secondary | ICD-10-CM | POA: Insufficient documentation

## 2021-04-04 DIAGNOSIS — S0993XA Unspecified injury of face, initial encounter: Secondary | ICD-10-CM | POA: Diagnosis present

## 2021-04-04 DIAGNOSIS — S199XXA Unspecified injury of neck, initial encounter: Secondary | ICD-10-CM | POA: Diagnosis not present

## 2021-04-04 DIAGNOSIS — S0232XA Fracture of orbital floor, left side, initial encounter for closed fracture: Secondary | ICD-10-CM | POA: Diagnosis not present

## 2021-04-04 DIAGNOSIS — S0285XA Fracture of orbit, unspecified, initial encounter for closed fracture: Secondary | ICD-10-CM | POA: Insufficient documentation

## 2021-04-04 DIAGNOSIS — I10 Essential (primary) hypertension: Secondary | ICD-10-CM | POA: Diagnosis not present

## 2021-04-04 DIAGNOSIS — Z79899 Other long term (current) drug therapy: Secondary | ICD-10-CM | POA: Insufficient documentation

## 2021-04-04 DIAGNOSIS — S0292XA Unspecified fracture of facial bones, initial encounter for closed fracture: Secondary | ICD-10-CM | POA: Diagnosis not present

## 2021-04-04 DIAGNOSIS — S0083XA Contusion of other part of head, initial encounter: Secondary | ICD-10-CM | POA: Diagnosis not present

## 2021-04-04 DIAGNOSIS — W19XXXA Unspecified fall, initial encounter: Secondary | ICD-10-CM

## 2021-04-04 DIAGNOSIS — M542 Cervicalgia: Secondary | ICD-10-CM | POA: Insufficient documentation

## 2021-04-04 DIAGNOSIS — I2510 Atherosclerotic heart disease of native coronary artery without angina pectoris: Secondary | ICD-10-CM | POA: Diagnosis not present

## 2021-04-04 DIAGNOSIS — S0231XA Fracture of orbital floor, right side, initial encounter for closed fracture: Secondary | ICD-10-CM | POA: Diagnosis not present

## 2021-04-04 MED ORDER — FLUORESCEIN SODIUM 1 MG OP STRP
1.0000 | ORAL_STRIP | Freq: Once | OPHTHALMIC | Status: AC
  Administered 2021-04-05: 1 via OPHTHALMIC
  Filled 2021-04-04: qty 1

## 2021-04-04 NOTE — ED Triage Notes (Signed)
Pt reports a fall x 1 hr ago. Pt complains of a nosebleed, swelling to left forehead, left face scrapes and redness. Pt reports getting out of his recliner and falling on his face. Pt had on glasses and has marking to both sides of his face.   ?

## 2021-04-04 NOTE — ED Provider Notes (Signed)
?[facility] ?Provider Note ? ? ?CSN: [id number] ?Arrival date & time: 04/04/21  2302 ? ?  ? ?History ? ?Chief Complaint  ?Patient presents with  ? Fall  ? ? ?Alex Martin is a 81 y.o. male with a history of coronary artery disease, hypertension, hyperlipidemia, and GERD who presents to the emergency department status post fall shortly prior to arrival with facial injury.  Patient reports that he was getting out of his recliner too quickly and thinks it propelled him forward some, he tripped and fell onto his face.  He thinks he did have loss of consciousness.  He is having pain to the left forehead area, no alleviating or aggravating factors, his neck is also somewhat sore.  He did have some bleeding from his nose.  He was wearing glasses and the frame broke causing the lens to follow-up with the glass did not shatter.  His vision in his eyes feels at baseline without his glasses.  His girlfriend at bedside states that he is acting at baseline.  He does not take blood thinners.  He denies diplopia, loss of vision, blurry vision, chest pain, abdominal pain, lower extremity pain, vomiting, seizure activity, numbness, or weakness. ? ?HPI ? ?  ? ?Home Medications ?Prior to Admission medications   ?Medication Sig Start Date End Date Taking? Authorizing Provider  ?aspirin EC 81 MG tablet Take 81 mg by mouth daily.    [provider]  ?Cyanocobalamin (VITAMIN B-12 PO) Take 1 tablet by mouth daily.    [provider]  ?doxazosin (CARDURA) 2 MG tablet TAKE 1 TABLET BY MOUTH EVERY DAY 10/01/20   Susy Frizzle, MD  ?esomeprazole (NEXIUM) 40 MG capsule Take 1 capsule (40 mg total) by mouth daily. 05/15/20   Susy Frizzle, MD  ?hydrochlorothiazide (HYDRODIURIL) 25 MG tablet TAKE 1 TABLET BY MOUTH EVERY DAY 04/02/21   Susy Frizzle, MD  ?latanoprost (XALATAN) 0.005 % ophthalmic solution Place 1 drop into both eyes at bedtime.  10/17/14   [provider]   ?losartan (COZAAR) 100 MG tablet Take 1 tablet (100 mg total) by mouth daily. 03/11/21   Jerline Pain, MD  ?metoprolol succinate (TOPROL-XL) 25 MG 24 hr tablet TAKE 1 TABLET BY MOUTH EVERY DAY 11/20/20   Jerline Pain, MD  ?Multiple Vitamin (MULTIVITAMIN) tablet Take 1 tablet by mouth daily.    [provider]  ?rosuvastatin (CRESTOR) 40 MG tablet TAKE 1 TABLET BY MOUTH EVERY DAY 11/30/20   Jerline Pain, MD  ?solifenacin (VESICARE) 5 MG tablet Take 5 mg by mouth daily.    [provider]  ?timolol (TIMOPTIC) 0.5 % ophthalmic solution Place 1 drop into both eyes daily. 01/25/18   [provider]  ?   ? ?Allergies    ?Amlodipine and Shellfish allergy   ? ?Review of Systems   ?Review of Systems  ?Constitutional:  Negative for chills and fever.  ?HENT:  Positive for facial swelling and nosebleeds.   ?Respiratory:  Negative for shortness of breath.   ?Cardiovascular:  Negative for chest pain.  ?Gastrointestinal:  Negative for abdominal pain and vomiting.  ?Musculoskeletal:  Positive for neck pain. Negative for back pain.  ?Neurological:  Positive for syncope (s/p head injury) and headaches. Negative for weakness.  ?All other systems reviewed and are negative. ? ?Physical Exam ?Updated Vital Signs ?BP (!) 151/119 (BP Location: Right Arm)   Pulse 72   Temp 98 ?F (36.7 ?C) (Oral)  Resp 18   Ht '5\' 8"'$  (1.727 m)   Wt 79.4 kg   SpO2 94%   BMI 26.61 kg/m?  ?Physical Exam ?Vitals and nursing note reviewed.  ?HENT:  ?   Head:  ?   Comments: Left forehead hematoma present with overlying abrasion.  Left cheek has abrasions present as well that are linear without significant subcutaneous tissue exposure.  Patient has left forehead and left periorbital bruising and swelling present extending to the maxilla.  Tender to palpation to the left frontal region as well as the left superior periorbital region. ?   Nose:  ?   Comments: No active epistaxis. ?   Mouth/Throat:  ?   Comments: Uvula midline.  No  obvious malocclusion. ?Eyes:  ?   General: Vision grossly intact.  ?   Conjunctiva/sclera:  ?   Right eye: No hemorrhage. ?   Left eye: Hemorrhage present.  ?   Comments: Pupils equal round and reactive to light.  Vision grossly intact.  Extraocular movements intact with the exception of left eye limited looking inferiorly.  Fluorescein stain without uptake, negative Seidel sign.  No proptosis.  ?Cardiovascular:  ?   Rate and Rhythm: Normal rate and regular rhythm.  ?Pulmonary:  ?   Effort: Pulmonary effort is normal.  ?   Breath sounds: Normal breath sounds.  ?Abdominal:  ?   General: There is no distension.  ?   Palpations: Abdomen is soft.  ?   Tenderness: There is no abdominal tenderness. There is no guarding or rebound.  ?Musculoskeletal:  ?   Cervical back: Neck supple. Tenderness (To bilateral paraspinal muscles.  No point/focal vertebral tenderness or step-off.) present.  ?Neurological:  ?   Mental Status: He is alert.  ?   Comments: Alert.  Clear speech.  No facial droop.  Sensation and strength grossly intact bilateral upper and lower extremities.  Patient is ambulatory.  ?Psychiatric:     ?   Mood and Affect: Mood normal.     ?   Behavior: Behavior normal.  ? ? ?ED Results / Procedures / Treatments   ?Labs ?(all labs ordered are listed, but only abnormal results are displayed) ?Labs Reviewed - No data to display ? ?EKG ?None ? ?Radiology ?CT Head Wo Contrast ? ?Result Date: 04/05/2021 ?CLINICAL DATA:  Facial trauma, blunt; Neck trauma, midline tenderness (Age 70-64y). Fall, epistaxis, scalp swelling EXAM: CT HEAD WITHOUT CONTRAST CT MAXILLOFACIAL WITHOUT CONTRAST CT CERVICAL SPINE WITHOUT CONTRAST TECHNIQUE: Multidetector CT imaging of the head, cervical spine, and maxillofacial structures were performed using the standard protocol without intravenous contrast. Multiplanar CT image reconstructions of the cervical spine and maxillofacial structures were also generated. RADIATION DOSE REDUCTION: This exam  was performed according to the departmental dose-optimization program which includes automated exposure control, adjustment of the mA and/or kV according to patient size and/or use of iterative reconstruction technique. COMPARISON:  None. FINDINGS: CT HEAD FINDINGS Brain: Parenchymal volume loss is commensurate with the patient's age. Mild periventricular white matter changes are present likely reflecting the sequela of small vessel ischemia. No acute intracranial hemorrhage or infarct. No abnormal mass effect or midline shift. No abnormal intra or extra-axial mass lesion or fluid collection. Ventricular size is normal. Cerebellum is unremarkable. Vascular: No hyperdense vessel or unexpected calcification. Skull: Intact. In particular the floor of the left anterior cranial fossa appears intact. Other: Mastoid air cells and middle ear cavities are clear. Large frontal scalp hematoma noted. CT MAXILLOFACIAL FINDINGS Osseous: There are minimally displaced  fractures of the medial and moderately displaced fractures of the superior orbital walls of the left orbit. The depressed fracture fragment of the supraorbital ridge abuts the left ocular globe superiorly, best seen on axial image # 22/3 and coronal image # 27/7, and may impinge upon the superior rectus. Punctate foci of gas are seen within the extraconal retro-orbital left orbit secondary to communication with the left frontal sinus. Orbits: Extensive left preseptal soft tissue swelling. As noted above, depressed supraorbital ridge fracture fragment abuts the ocular globe superiorly and may impinge upon the superior rectus. Sinuses: There is fluid opacification within the left frontal sinus and left ethmoid air cells likely representing blood in the setting of acute trauma. Minimal layering mucus or blood within the left maxillary sinus. Remaining paranasal sinuses are clear. Soft tissues: There is extensive left preseptal soft tissue swelling. CT CERVICAL SPINE  FINDINGS Alignment: 2 mm anterolisthesis C3-4 and C7-T1 likely degenerative in nature. Otherwise normal cervical lordosis. Skull base and vertebrae: Craniocervical alignment is normal. Atlantodental interval is not w

## 2021-04-05 ENCOUNTER — Emergency Department (HOSPITAL_COMMUNITY): Payer: Medicare PPO

## 2021-04-05 DIAGNOSIS — S0232XA Fracture of orbital floor, left side, initial encounter for closed fracture: Secondary | ICD-10-CM | POA: Diagnosis not present

## 2021-04-05 DIAGNOSIS — S0292XA Unspecified fracture of facial bones, initial encounter for closed fracture: Secondary | ICD-10-CM | POA: Diagnosis not present

## 2021-04-05 DIAGNOSIS — H532 Diplopia: Secondary | ICD-10-CM | POA: Diagnosis not present

## 2021-04-05 DIAGNOSIS — S199XXA Unspecified injury of neck, initial encounter: Secondary | ICD-10-CM | POA: Diagnosis not present

## 2021-04-05 DIAGNOSIS — M542 Cervicalgia: Secondary | ICD-10-CM | POA: Diagnosis not present

## 2021-04-05 DIAGNOSIS — S0285XA Fracture of orbit, unspecified, initial encounter for closed fracture: Secondary | ICD-10-CM | POA: Diagnosis not present

## 2021-04-05 MED ORDER — OXYCODONE-ACETAMINOPHEN 5-325 MG PO TABS
1.0000 | ORAL_TABLET | Freq: Once | ORAL | Status: AC
Start: 2021-04-05 — End: 2021-04-05
  Administered 2021-04-05: 1 via ORAL
  Filled 2021-04-05: qty 1

## 2021-04-05 MED ORDER — AMOXICILLIN-POT CLAVULANATE 875-125 MG PO TABS: 1.0000 | ORAL_TABLET | Freq: Two times a day (BID) | ORAL | 0 refills | Status: DC

## 2021-04-05 MED ORDER — OXYCODONE-ACETAMINOPHEN 5-325 MG PO TABS: 1.0000 | ORAL_TABLET | Freq: Four times a day (QID) | ORAL | 0 refills | Status: DC | PRN

## 2021-04-05 MED ORDER — ONDANSETRON 4 MG PO TBDP
4.0000 mg | ORAL_TABLET | Freq: Once | ORAL | Status: AC
  Administered 2021-04-05: 4 mg via ORAL
  Filled 2021-04-05: qty 1

## 2021-04-05 MED ORDER — ONDANSETRON 4 MG PO TBDP: 4.0000 mg | ORAL_TABLET | Freq: Three times a day (TID) | ORAL | 0 refills | Status: DC | PRN

## 2021-04-05 MED ORDER — AMOXICILLIN-POT CLAVULANATE 875-125 MG PO TABS
1.0000 | ORAL_TABLET | Freq: Once | ORAL | Status: AC
  Administered 2021-04-05: 1 via ORAL
  Filled 2021-04-05: qty 1

## 2021-04-05 NOTE — Discharge Instructions (Addendum)
You were seen in the emergency department today after a fall with a facial injury.  Your CT scan showed that you have fractures around your left eye as well as some blood in your left sinus area.  We are sending home with the following medicines: ? ?-Percocet-this is a narcotic/controlled substance medication that has potential addicting qualities.  We recommend that you take 1 tablet every 6 hours as needed for severe pain.  Do not drive or operate heavy machinery when taking this medicine as it can be sedating. Do not drink alcohol or take other sedating medications when taking this medicine for safety reasons.  Keep this out of reach of small children.  Please be aware this medicine has Tylenol in it (325 mg/tab) do not exceed the maximum dose of Tylenol in a day per over the counter recommendations should you decide to supplement with Tylenol over the counter.  ? ?- Zofran -take every 8 hours as needed for nausea and vomiting ? ?- Augmentin: Take twice per day for the next 1 week to help prevent infection. ? ?We have prescribed you new medication(s) today. Discuss the medications prescribed today with your pharmacist as they can have adverse effects and interactions with your other medicines including over the counter and prescribed medications. Seek medical evaluation if you start to experience new or abnormal symptoms after taking one of these medicines, seek care immediately if you start to experience difficulty breathing, feeling of your throat closing, facial swelling, or rash as these could be indications of a more serious allergic reaction ? ? ?With your fractures around the eye and make sure you do not blow your nose at all. ? ?Please follow-up with Dr. Dairl Ponder office in clinic today, call first thing this morning to schedule this. ? ?Additionally follow-up with your primary care provider as soon as possible. ? ?Return to the emergency department for any new or worsening symptoms including but not  limited to new or worsening pain, change in your vision, loss of vision, double vision, inability to keep fluids down, new areas of pain, passing out, seizure activity, or any other concerns. ?

## 2021-04-05 NOTE — ED Notes (Signed)
Pt discharged. Instructions and prescriptions given. AAOX4. Pt in no apparent distress with mild pain. The opportunity to ask questions was provided.  

## 2021-04-27 DIAGNOSIS — S0285XD Fracture of orbit, unspecified, subsequent encounter for fracture with routine healing: Secondary | ICD-10-CM | POA: Diagnosis not present

## 2021-04-29 ENCOUNTER — Telehealth: Payer: Self-pay | Admitting: Family Medicine

## 2021-04-29 NOTE — Telephone Encounter (Signed)
Left message for patient to call back and schedule Medicare Annual Wellness Visit (AWV) in office.  ? ?If not able to come in office, please offer to do virtually or by telephone.  Left office number and my jabber [phone]. ? ?Last AWV:04/30/2020 ? ?Please schedule at anytime with Nurse Health Advisor. ?  ?

## 2021-05-02 ENCOUNTER — Other Ambulatory Visit: Payer: Self-pay | Admitting: Family Medicine

## 2021-05-03 ENCOUNTER — Other Ambulatory Visit: Payer: Self-pay

## 2021-05-04 ENCOUNTER — Other Ambulatory Visit: Payer: Medicare PPO

## 2021-05-04 DIAGNOSIS — Z136 Encounter for screening for cardiovascular disorders: Secondary | ICD-10-CM

## 2021-05-04 DIAGNOSIS — E7800 Pure hypercholesterolemia, unspecified: Secondary | ICD-10-CM | POA: Diagnosis not present

## 2021-05-04 DIAGNOSIS — R351 Nocturia: Secondary | ICD-10-CM | POA: Diagnosis not present

## 2021-05-04 DIAGNOSIS — I10 Essential (primary) hypertension: Secondary | ICD-10-CM

## 2021-05-05 LAB — CBC WITH DIFFERENTIAL/PLATELET
Absolute Monocytes: 600 cells/uL (ref 200–950)
Basophils Absolute: 61 cells/uL (ref 0–200)
Basophils Relative: 1.1 %
Eosinophils Absolute: 330 cells/uL (ref 15–500)
Eosinophils Relative: 6 %
HCT: 44.6 % (ref 38.5–50.0)
Hemoglobin: 15.3 g/dL (ref 13.2–17.1)
Lymphs Abs: 1568 cells/uL (ref 850–3900)
MCH: 34.5 pg — ABNORMAL HIGH (ref 27.0–33.0)
MCHC: 34.3 g/dL (ref 32.0–36.0)
MCV: 100.5 fL — ABNORMAL HIGH (ref 80.0–100.0)
MPV: 10.4 fL (ref 7.5–12.5)
Monocytes Relative: 10.9 %
Neutro Abs: 2943 cells/uL (ref 1500–7800)
Neutrophils Relative %: 53.5 %
Platelets: 160 10*3/uL (ref 140–400)
RBC: 4.44 10*6/uL (ref 4.20–5.80)
RDW: 12.3 % (ref 11.0–15.0)
Total Lymphocyte: 28.5 %
WBC: 5.5 10*3/uL (ref 3.8–10.8)

## 2021-05-05 LAB — LIPID PANEL
Cholesterol: 119 mg/dL
HDL: 49 mg/dL
LDL Cholesterol (Calc): 52 mg/dL
Non-HDL Cholesterol (Calc): 70 mg/dL
Total CHOL/HDL Ratio: 2.4 (calc)
Triglycerides: 95 mg/dL

## 2021-05-05 LAB — COMPREHENSIVE METABOLIC PANEL WITH GFR
AG Ratio: 1.5 (calc) (ref 1.0–2.5)
ALT: 19 U/L (ref 9–46)
AST: 20 U/L (ref 10–35)
Albumin: 3.9 g/dL (ref 3.6–5.1)
Alkaline phosphatase (APISO): 59 U/L (ref 35–144)
BUN: 21 mg/dL (ref 7–25)
CO2: 26 mmol/L (ref 20–32)
Calcium: 9.4 mg/dL (ref 8.6–10.3)
Chloride: 105 mmol/L (ref 98–110)
Creat: 1.11 mg/dL (ref 0.70–1.22)
Globulin: 2.6 g/dL (ref 1.9–3.7)
Glucose, Bld: 101 mg/dL — ABNORMAL HIGH (ref 65–99)
Potassium: 4.5 mmol/L (ref 3.5–5.3)
Sodium: 141 mmol/L (ref 135–146)
Total Bilirubin: 0.3 mg/dL (ref 0.2–1.2)
Total Protein: 6.5 g/dL (ref 6.1–8.1)

## 2021-05-05 LAB — PSA: PSA: 0.32 ng/mL

## 2021-05-10 ENCOUNTER — Other Ambulatory Visit: Payer: Self-pay | Admitting: Family Medicine

## 2021-05-11 ENCOUNTER — Ambulatory Visit (INDEPENDENT_AMBULATORY_CARE_PROVIDER_SITE_OTHER): Payer: Medicare PPO | Admitting: Family Medicine

## 2021-05-11 VITALS — BP 115/68 | HR 62 | Temp 97.4°F | Ht 68.0 in | Wt 177.0 lb

## 2021-05-11 DIAGNOSIS — E7800 Pure hypercholesterolemia, unspecified: Secondary | ICD-10-CM

## 2021-05-11 DIAGNOSIS — Z125 Encounter for screening for malignant neoplasm of prostate: Secondary | ICD-10-CM | POA: Diagnosis not present

## 2021-05-11 DIAGNOSIS — Z0000 Encounter for general adult medical examination without abnormal findings: Secondary | ICD-10-CM | POA: Diagnosis not present

## 2021-05-11 DIAGNOSIS — I10 Essential (primary) hypertension: Secondary | ICD-10-CM

## 2021-05-11 DIAGNOSIS — Z951 Presence of aortocoronary bypass graft: Secondary | ICD-10-CM

## 2021-05-11 NOTE — Progress Notes (Signed)
? ?Subjective:  ? ? Patient ID: Alex Martin, male    DOB: 1940/02/09, 81 y.o.   MRN: [id number] ? ?HPI ?Patient is a very pleasant 81 year old Caucasian male who presents today for complete physical exam. In 2019, he suffered a non-ST elevation myocardial infarction.  He underwent coronary artery bypass grafting x4.  He denies any chest pain shortness of breath or dyspnea on exertion today.  Patient had a colonoscopy 2021 and they found several polyps.  They recommended a repeat colonoscopy in 3 years (2024).  He also had an EGD which showed changes consistent with Barrett's esophagus.  His gastroenterologist recommended repeating an EGD in 3 years depending upon the patient's health at that time.  Overall he is doing well.  He denies any memory issues or depression.  He did recently suffer a fall and has significant bruising on his face.  However he denies any problems with his balance or fainting or syncope. ?Immunization History  ?Administered Date(s) Administered  ? Influenza, High Dose Seasonal PF 11/01/2012, 10/14/2015, 09/17/2016, 09/15/2017, 09/11/2018, 09/11/2018, 09/25/2019  ? Influenza,inj,Quad PF,6+ Mos 10/29/2014  ? Influenza-Unspecified 10/04/2013, 10/14/2015, 09/21/2017, 09/25/2018, 09/25/2019  ? Moderna SARS-COV2 Booster Vaccination 11/11/2019  ? Moderna Sars-Covid-2 Vaccination 01/31/2019, 02/28/2019, 04/16/2020  ? PNEUMOCOCCAL CONJUGATE-20 04/06/2020  ? Pneumococcal Conjugate-13 10/29/2014  ? Pneumococcal Polysaccharide-23 06/30/2016  ? Tdap 06/17/2012  ? Zoster Recombinat (Shingrix) 11/16/2017  ? Zoster, Live 01/18/2007  ? ? ?Past Medical History:  ?Diagnosis Date  ? Abdominal distension   ? UMBILICAL HERNIA-NO PAIN  ? Aortic insufficiency 02/23/2021  ? Echocardiogram 06/13/2018:  EF 60-65, mild LVH, Gr 1 DD, no RWMA, normla RVSF, severe LAE, mild AI  ? Bleeding from the nose   ? SEVERAL EPISODES THAT REQUIRED TX IN ER--LAST EPISODE WAS APPROX 1 YR AGO  ? Blood in urine   ? MICROSCOPIC  BLOODEVALUATED BY DR. NESI FRIDAY 02/11/11--URINE SAMPLE AND EXAM-- AND BLOOD SAMPLE AND TO BE SEEN AGAIN IN 6 MONTHS AND GIVEN VESICARE  FOR FREQUENCY AND URGENCY  ? CAD (coronary artery disease) 02/22/2021  ? NSTEMI in 01/2018 s/p CABG Post CABG AFib  ? Carotid artery disease ([place]) 02/22/2021  ? Pre CABG Dopplers 02/02/2018:  Bilat ICA 1-39  ? Coronary artery disease   ? GERD (gastroesophageal reflux disease)   ? Hyperlipidemia   ? Hypertension   ? Ischemic cardiomyopathy 02/22/2021  ? EF ~ 30 >> Improved to normal  Echocardiogram 06/13/2018:  EF 60-65, mild LVH, Gr 1 DD, no RWMA, normla RVSF, severe LAE, mild AI Echocardiogram 02/02/2018:  Mod LVH, EF 30-35, WMA c/w LAD infarct, normal RVSF, mild pulmonary hypertension   ? ?Past Surgical History:  ?Procedure Laterality Date  ? CARDIAC CATHETERIZATION    ? CORONARY ARTERY BYPASS GRAFT N/A 02/05/2018  ? Procedure: CORONARY ARTERY BYPASS GRAFTING (CABG) x4 USING BILATERAL IMA'S WITH LEFT GREATER SAPHENOUS VEIN HARVEST AND EXPLORATION OF RIGHT SAPHENOUS;  Surgeon: Gaye Pollack, MD;  Location: [facility];  Service: Open Heart Surgery;  Laterality: N/A;  ? HERNIA REPAIR  1959  ? [facility]  ? LEFT HEART CATH AND CORONARY ANGIOGRAPHY N/A 02/02/2018  ? Procedure: LEFT HEART CATH AND CORONARY ANGIOGRAPHY;  Surgeon: Martinique, Peter M, MD;  Location: [facility];  Service: Cardiovascular;  Laterality: N/A;  ? TEE WITHOUT CARDIOVERSION N/A 02/05/2018  ? Procedure: TRANSESOPHAGEAL ECHOCARDIOGRAM (TEE);  Surgeon: Gaye Pollack, MD;  Location: [facility];  Service: Open Heart Surgery;  Laterality: N/A;  ? UMBILICAL HERNIA REPAIR  02/15/2011  ?  Procedure: HERNIA REPAIR UMBILICAL ADULT;  Surgeon: Odis Hollingshead, MD;  Location: [facility];  Service: General;  Laterality: N/A;  umbilical hernia repair with mesh  ? VASECTOMY  1976  ? ?Current Outpatient Medications on File Prior to Visit  ?Medication Sig Dispense Refill  ? aspirin EC 81 MG tablet Take 81 mg by mouth daily.    ? Cyanocobalamin (VITAMIN  B-12 PO) Take 1 tablet by mouth daily.    ? doxazosin (CARDURA) 2 MG tablet TAKE 1 TABLET BY MOUTH EVERY DAY 90 tablet 1  ? esomeprazole (NEXIUM) 40 MG capsule TAKE 1 CAPSULE (40 MG TOTAL) BY MOUTH DAILY. 90 capsule 3  ? hydrochlorothiazide (HYDRODIURIL) 25 MG tablet TAKE 1 TABLET BY MOUTH EVERY DAY 90 tablet 3  ? latanoprost (XALATAN) 0.005 % ophthalmic solution Place 1 drop into both eyes at bedtime.   0  ? losartan (COZAAR) 100 MG tablet Take 1 tablet (100 mg total) by mouth daily. 90 tablet 3  ? metoprolol succinate (TOPROL-XL) 25 MG 24 hr tablet TAKE 1 TABLET BY MOUTH EVERY DAY 90 tablet 3  ? Multiple Vitamin (MULTIVITAMIN) tablet Take 1 tablet by mouth daily.    ? ondansetron (ZOFRAN-ODT) 4 MG disintegrating tablet Take 1 tablet (4 mg total) by mouth every 8 (eight) hours as needed for nausea or vomiting. 8 tablet 0  ? oxyCODONE-acetaminophen (PERCOCET/ROXICET) 5-325 MG tablet Take 1 tablet by mouth every 6 (six) hours as needed for severe pain. 10 tablet 0  ? rosuvastatin (CRESTOR) 40 MG tablet TAKE 1 TABLET BY MOUTH EVERY DAY 90 tablet 3  ? solifenacin (VESICARE) 5 MG tablet Take 5 mg by mouth daily.    ? timolol (TIMOPTIC) 0.5 % ophthalmic solution Place 1 drop into both eyes daily.    ? ?No current facility-administered medications on file prior to visit.  ? ?Allergies  ?Allergen Reactions  ? Amlodipine Swelling  ?  swelling in ankles  ? Shellfish Allergy Other (See Comments)  ?  N&V AFTER EATING MUSSELS  ? ?Social History  ? ?Socioeconomic History  ? Marital status: Divorced  ?  Spouse name: Not on file  ? Number of children: Not on file  ? Years of education: Not on file  ? Highest education level: Bachelor's degree (e.g., BA, AB, BS)  ?Occupational History  ? Not on file  ?Tobacco Use  ? Smoking status: Former  ?  Years: 10.00  ?  Types: Cigarettes  ?  Quit date: 02/22/1992  ?  Years since quitting: 29.2  ? Smokeless tobacco: Never  ? Tobacco comments:  ?  [place] - 1 PP WEEK  ?Vaping Use   ? Vaping Use: Never used  ?Substance and Sexual Activity  ? Alcohol use: Yes  ?  Comment: 2 GLASSES WINE DAILY  ? Drug use: No  ? Sexual activity: Not on file  ?Other Topics Concern  ? Not on file  ?Social History Narrative  ? Not on file  ? ?Social Determinants of Health  ? ?Financial Resource Strain: Not on file  ?Food Insecurity: Not on file  ?Transportation Needs: Not on file  ?Physical Activity: Not on file  ?Stress: Not on file  ?Social Connections: Not on file  ?Intimate Partner Violence: Not on file  ? ? ? ? ?Review of Systems  ?All other systems reviewed and are negative. ? ?   ?Objective:  ? Physical Exam ?Vitals reviewed.  ?Constitutional:   ?   General: He is not in acute  distress. ?   Appearance: He is well-developed. He is not diaphoretic.  ?HENT:  ?   Head: Normocephalic and atraumatic.  ?   Right Ear: External ear normal.  ?   Left Ear: External ear normal.  ?   Nose: Nose normal.  ?   Mouth/Throat:  ?   Pharynx: No oropharyngeal exudate.  ?Eyes:  ?   General: No scleral icterus.    ?   Right eye: No discharge.     ?   Left eye: No discharge.  ?   Conjunctiva/sclera: Conjunctivae normal.  ?   Pupils: Pupils are equal, round, and reactive to light.  ?Neck:  ?   Thyroid: No thyromegaly.  ?   Vascular: No JVD.  ?   Trachea: No tracheal deviation.  ?Cardiovascular:  ?   Rate and Rhythm: Normal rate and regular rhythm.  ?   Heart sounds: Normal heart sounds. No murmur heard. ?  No friction rub. No gallop.  ?Pulmonary:  ?   Effort: Pulmonary effort is normal. No respiratory distress.  ?   Breath sounds: Normal breath sounds. No stridor. No wheezing or rales.  ?Chest:  ?   Chest wall: No tenderness.  ?Abdominal:  ?   General: Bowel sounds are normal. There is no distension.  ?   Palpations: Abdomen is soft. There is no mass.  ?   Tenderness: There is no abdominal tenderness. There is no guarding or rebound.  ?Musculoskeletal:     ?   General: No tenderness or deformity. Normal range of motion.  ?    Cervical back: Normal range of motion and neck supple.  ?Lymphadenopathy:  ?   Cervical: No cervical adenopathy.  ?Skin: ?   General: Skin is warm.  ?   Coloration: Skin is not pale.  ?   Findings: No erythema or rash.

## 2021-06-08 DIAGNOSIS — H401131 Primary open-angle glaucoma, bilateral, mild stage: Secondary | ICD-10-CM | POA: Diagnosis not present

## 2021-06-08 DIAGNOSIS — H2513 Age-related nuclear cataract, bilateral: Secondary | ICD-10-CM | POA: Diagnosis not present

## 2021-06-08 DIAGNOSIS — H04123 Dry eye syndrome of bilateral lacrimal glands: Secondary | ICD-10-CM | POA: Diagnosis not present

## 2021-06-08 DIAGNOSIS — H43813 Vitreous degeneration, bilateral: Secondary | ICD-10-CM | POA: Diagnosis not present

## 2021-06-15 DIAGNOSIS — L570 Actinic keratosis: Secondary | ICD-10-CM | POA: Diagnosis not present

## 2021-06-15 DIAGNOSIS — D225 Melanocytic nevi of trunk: Secondary | ICD-10-CM | POA: Diagnosis not present

## 2021-06-15 DIAGNOSIS — Z1283 Encounter for screening for malignant neoplasm of skin: Secondary | ICD-10-CM | POA: Diagnosis not present

## 2021-06-15 DIAGNOSIS — X32XXXD Exposure to sunlight, subsequent encounter: Secondary | ICD-10-CM | POA: Diagnosis not present

## 2021-07-27 DIAGNOSIS — S0285XD Fracture of orbit, unspecified, subsequent encounter for fracture with routine healing: Secondary | ICD-10-CM | POA: Diagnosis not present

## 2021-07-27 DIAGNOSIS — S0285XA Fracture of orbit, unspecified, initial encounter for closed fracture: Secondary | ICD-10-CM | POA: Diagnosis not present

## 2021-08-04 ENCOUNTER — Encounter: Payer: Self-pay | Admitting: Family Medicine

## 2021-08-04 DIAGNOSIS — I10 Essential (primary) hypertension: Secondary | ICD-10-CM | POA: Diagnosis not present

## 2021-08-04 DIAGNOSIS — I252 Old myocardial infarction: Secondary | ICD-10-CM | POA: Diagnosis not present

## 2021-08-04 DIAGNOSIS — F1010 Alcohol abuse, uncomplicated: Secondary | ICD-10-CM | POA: Diagnosis not present

## 2021-08-04 DIAGNOSIS — K219 Gastro-esophageal reflux disease without esophagitis: Secondary | ICD-10-CM | POA: Diagnosis not present

## 2021-08-04 DIAGNOSIS — E785 Hyperlipidemia, unspecified: Secondary | ICD-10-CM | POA: Diagnosis not present

## 2021-08-04 DIAGNOSIS — I739 Peripheral vascular disease, unspecified: Secondary | ICD-10-CM | POA: Diagnosis not present

## 2021-08-04 DIAGNOSIS — I255 Ischemic cardiomyopathy: Secondary | ICD-10-CM | POA: Diagnosis not present

## 2021-08-04 DIAGNOSIS — H409 Unspecified glaucoma: Secondary | ICD-10-CM | POA: Diagnosis not present

## 2021-08-04 DIAGNOSIS — I2510 Atherosclerotic heart disease of native coronary artery without angina pectoris: Secondary | ICD-10-CM | POA: Diagnosis not present

## 2021-09-24 DIAGNOSIS — R350 Frequency of micturition: Secondary | ICD-10-CM | POA: Diagnosis not present

## 2021-09-24 DIAGNOSIS — N401 Enlarged prostate with lower urinary tract symptoms: Secondary | ICD-10-CM | POA: Diagnosis not present

## 2021-11-22 ENCOUNTER — Telehealth: Payer: Self-pay

## 2021-11-22 NOTE — Telephone Encounter (Signed)
Pt called asking if you would recommend him to have the RSV vaccine? Thank you.

## 2021-11-26 ENCOUNTER — Other Ambulatory Visit: Payer: Self-pay | Admitting: Cardiology

## 2021-12-03 ENCOUNTER — Other Ambulatory Visit: Payer: Self-pay

## 2021-12-14 DIAGNOSIS — R3915 Urgency of urination: Secondary | ICD-10-CM | POA: Diagnosis not present

## 2021-12-14 DIAGNOSIS — N401 Enlarged prostate with lower urinary tract symptoms: Secondary | ICD-10-CM | POA: Diagnosis not present

## 2021-12-14 DIAGNOSIS — R350 Frequency of micturition: Secondary | ICD-10-CM | POA: Diagnosis not present

## 2021-12-28 DIAGNOSIS — H401131 Primary open-angle glaucoma, bilateral, mild stage: Secondary | ICD-10-CM | POA: Diagnosis not present

## 2021-12-28 DIAGNOSIS — H04123 Dry eye syndrome of bilateral lacrimal glands: Secondary | ICD-10-CM | POA: Diagnosis not present

## 2021-12-28 DIAGNOSIS — H2513 Age-related nuclear cataract, bilateral: Secondary | ICD-10-CM | POA: Diagnosis not present

## 2021-12-28 DIAGNOSIS — H43813 Vitreous degeneration, bilateral: Secondary | ICD-10-CM | POA: Diagnosis not present

## 2021-12-29 ENCOUNTER — Other Ambulatory Visit: Payer: Medicare PPO

## 2021-12-29 DIAGNOSIS — E7800 Pure hypercholesterolemia, unspecified: Secondary | ICD-10-CM

## 2021-12-29 DIAGNOSIS — I10 Essential (primary) hypertension: Secondary | ICD-10-CM

## 2021-12-30 ENCOUNTER — Other Ambulatory Visit: Payer: Medicare PPO

## 2021-12-30 LAB — COMPLETE METABOLIC PANEL WITH GFR
AG Ratio: 1.5 (calc) (ref 1.0–2.5)
ALT: 17 U/L (ref 9–46)
AST: 19 U/L (ref 10–35)
Albumin: 4 g/dL (ref 3.6–5.1)
Alkaline phosphatase (APISO): 59 U/L (ref 35–144)
BUN/Creatinine Ratio: 19 (calc) (ref 6–22)
BUN: 24 mg/dL (ref 7–25)
CO2: 32 mmol/L (ref 20–32)
Calcium: 9.3 mg/dL (ref 8.6–10.3)
Chloride: 101 mmol/L (ref 98–110)
Creat: 1.26 mg/dL — ABNORMAL HIGH (ref 0.70–1.22)
Globulin: 2.6 g/dL (calc) (ref 1.9–3.7)
Glucose, Bld: 99 mg/dL (ref 65–99)
Potassium: 4.7 mmol/L (ref 3.5–5.3)
Sodium: 140 mmol/L (ref 135–146)
Total Bilirubin: 0.6 mg/dL (ref 0.2–1.2)
Total Protein: 6.6 g/dL (ref 6.1–8.1)
eGFR: 58 mL/min/{1.73_m2} — ABNORMAL LOW (ref >=60)

## 2021-12-30 LAB — CBC WITH DIFFERENTIAL/PLATELET
Absolute Monocytes: 714 cells/uL (ref 200–950)
Basophils Absolute: 102 cells/uL (ref 0–200)
Basophils Relative: 1.7 %
Eosinophils Absolute: 420 cells/uL (ref 15–500)
Eosinophils Relative: 7 %
HCT: 45.1 % (ref 38.5–50.0)
Hemoglobin: 15.5 g/dL (ref 13.2–17.1)
Lymphs Abs: 1812 cells/uL (ref 850–3900)
MCH: 34.2 pg — ABNORMAL HIGH (ref 27.0–33.0)
MCHC: 34.4 g/dL (ref 32.0–36.0)
MCV: 99.6 fL (ref 80.0–100.0)
MPV: 10.4 fL (ref 7.5–12.5)
Monocytes Relative: 11.9 %
Neutro Abs: 2952 cells/uL (ref 1500–7800)
Neutrophils Relative %: 49.2 %
Platelets: 190 10*3/uL (ref 140–400)
RBC: 4.53 10*6/uL (ref 4.20–5.80)
RDW: 12.6 % (ref 11.0–15.0)
Total Lymphocyte: 30.2 %
WBC: 6 10*3/uL (ref 3.8–10.8)

## 2021-12-30 LAB — LIPID PANEL
Cholesterol: 136 mg/dL (ref <200)
HDL: 57 mg/dL (ref >=40)
LDL Cholesterol (Calc): 61 mg/dL (calc)
Non-HDL Cholesterol (Calc): 79 mg/dL (calc) (ref <130)
Total CHOL/HDL Ratio: 2.4 (calc) (ref <5.0)
Triglycerides: 96 mg/dL (ref <150)

## 2022-01-03 DIAGNOSIS — X32XXXD Exposure to sunlight, subsequent encounter: Secondary | ICD-10-CM | POA: Diagnosis not present

## 2022-01-03 DIAGNOSIS — D225 Melanocytic nevi of trunk: Secondary | ICD-10-CM | POA: Diagnosis not present

## 2022-01-03 DIAGNOSIS — L570 Actinic keratosis: Secondary | ICD-10-CM | POA: Diagnosis not present

## 2022-01-04 ENCOUNTER — Encounter: Payer: Medicare PPO | Admitting: Family Medicine

## 2022-01-06 ENCOUNTER — Encounter: Payer: Medicare PPO | Admitting: Family Medicine

## 2022-01-06 ENCOUNTER — Other Ambulatory Visit: Payer: Self-pay | Admitting: Cardiology

## 2022-02-14 ENCOUNTER — Ambulatory Visit: Payer: Medicare PPO | Admitting: Podiatry

## 2022-02-14 ENCOUNTER — Encounter: Payer: Self-pay | Admitting: Podiatry

## 2022-02-14 ENCOUNTER — Ambulatory Visit (INDEPENDENT_AMBULATORY_CARE_PROVIDER_SITE_OTHER): Payer: Medicare PPO

## 2022-02-14 DIAGNOSIS — M2041 Other hammer toe(s) (acquired), right foot: Secondary | ICD-10-CM

## 2022-02-14 DIAGNOSIS — M7751 Other enthesopathy of right foot: Secondary | ICD-10-CM | POA: Diagnosis not present

## 2022-02-14 DIAGNOSIS — L84 Corns and callosities: Secondary | ICD-10-CM | POA: Diagnosis not present

## 2022-02-14 MED ORDER — TRIAMCINOLONE ACETONIDE 10 MG/ML IJ SUSP
10.0000 mg | Freq: Once | INTRAMUSCULAR | Status: AC
  Administered 2022-02-14: 10 mg

## 2022-02-15 NOTE — Progress Notes (Signed)
Subjective:   Patient ID: Alex Martin, male   DOB: 82 y.o.   MRN: [id number]   HPI Patient presents with severe chronic lesion right second toe painful and chronic lesion right fifth metatarsal head that is painful when pressed making walking difficult.  States that he has had problems with this toe over time did have surgery on it 6 years ago.  Patient does not smoke likes to be active   Review of Systems  All other systems reviewed and are negative.       Objective:  Physical Exam Vitals and nursing note reviewed.  Constitutional:      Appearance: He is well-developed.  Pulmonary:     Effort: Pulmonary effort is normal.  Musculoskeletal:        General: Normal range of motion.  Skin:    General: Skin is warm.  Neurological:     Mental Status: He is alert.     Neurovascular status intact muscle strength found to be adequate range of motion adequate with inflammation pain around the second digit right around the proximal phalanx with previous procedure was done.  Also noted to have chronic keratotic lesion fifth metatarsal head right painful when pressed with good digital perfusion well-oriented     Assessment:  Structural changes to the second digit right with inflammatory complex of the inner phalangeal joint medial side along with chronic lesion x 2 painful     Plan:  H&P all conditions discussed and went ahead today reviewed x-ray.  I did do a careful injection of the inner phalangeal joint right second toe 2 mg Dexasone Kenalog 5 mg Xylocaine debrided 2 separate lesions no angiogenic bleeding reappoint routine care may require arthroplasty that I made him aware of today with removal of bone spur from the right second toe  X-rays indicate over this last 6-year should have been some regrowth of bone around the head of the second digit right proximal phalanx

## 2022-02-23 ENCOUNTER — Other Ambulatory Visit: Payer: Self-pay | Admitting: Cardiology

## 2022-03-01 ENCOUNTER — Other Ambulatory Visit: Payer: Self-pay | Admitting: Cardiology

## 2022-03-09 ENCOUNTER — Ambulatory Visit: Payer: Medicare PPO | Admitting: Podiatry

## 2022-03-09 ENCOUNTER — Encounter: Payer: Self-pay | Admitting: Podiatry

## 2022-03-09 DIAGNOSIS — D169 Benign neoplasm of bone and articular cartilage, unspecified: Secondary | ICD-10-CM | POA: Diagnosis not present

## 2022-03-09 DIAGNOSIS — M2041 Other hammer toe(s) (acquired), right foot: Secondary | ICD-10-CM | POA: Diagnosis not present

## 2022-03-09 NOTE — Progress Notes (Signed)
Subjective:   Patient ID: Alex Martin, male   DOB: 82 y.o.   MRN: [id number]   HPI Patient presents stating that he only got relief for short period of time after the treatment last visit and his toe big toe and second toe are very sore   ROS      Objective:  Physical Exam  Neurovascular status intact with keratotic lesion between the hallux and second digit right with inflammation pain and failure to respond conservatively to debridement padding medication     Assessment:  Chronic hammertoe deformity second right exostosis big toe right     Plan:  H&P reviewed and recommended removal of bone second digit right with exostectomy hallux right and explained procedure risk patient wants surgery and I allowed him to read consent going over alternative treatments complications.  Patient understands no guarantee of success of surgery and that all complications cannot be discounted and that recovery will take several months.  He wants surgery signed consent form all questions answered scheduled outpatient surgery

## 2022-03-11 ENCOUNTER — Telehealth: Payer: Self-pay | Admitting: Urology

## 2022-03-11 NOTE — Telephone Encounter (Signed)
DOS  - 03/22/22  HAMMERTOE REPAIR 2ND RIGHT --- [id number] EXOSTECTOMY RIGHT BIG TOE  --- [id number]  [facility]  PER [facility] WEBSITE FOR CPT CODE 95188 NO PRIOR AUTH IS REQUIRE, FOR CPT CODE 41660 HAS BEEN APPROVE, AUTH # [id number], GOOD FROM 03/22/22 - 05/22/22.  TRACKING ID # [id number]

## 2022-03-21 MED ORDER — HYDROCODONE-ACETAMINOPHEN 10-325 MG PO TABS: 1.0000 | ORAL_TABLET | Freq: Three times a day (TID) | ORAL | 0 refills | Status: AC | PRN

## 2022-03-21 NOTE — Addendum Note (Signed)
Addended by: Wallene Huh on: 03/21/2022 03:41 PM   Modules accepted: Orders

## 2022-03-22 ENCOUNTER — Telehealth: Payer: Self-pay | Admitting: *Deleted

## 2022-03-22 DIAGNOSIS — M25774 Osteophyte, right foot: Secondary | ICD-10-CM

## 2022-03-22 DIAGNOSIS — M2041 Other hammer toe(s) (acquired), right foot: Secondary | ICD-10-CM

## 2022-03-22 NOTE — Telephone Encounter (Signed)
Caregiver calling because she didn't were the pain medicine was sent, explained that it was sent to pharmacy on file ([facility]), verbalized understanding and will pick up from there,thought it had gone to [facility].

## 2022-03-27 ENCOUNTER — Other Ambulatory Visit: Payer: Self-pay | Admitting: Family Medicine

## 2022-03-28 ENCOUNTER — Ambulatory Visit (INDEPENDENT_AMBULATORY_CARE_PROVIDER_SITE_OTHER): Payer: Medicare PPO | Admitting: Podiatry

## 2022-03-28 ENCOUNTER — Ambulatory Visit (INDEPENDENT_AMBULATORY_CARE_PROVIDER_SITE_OTHER): Payer: Medicare PPO

## 2022-03-28 ENCOUNTER — Encounter: Payer: Self-pay | Admitting: Podiatry

## 2022-03-28 DIAGNOSIS — M2041 Other hammer toe(s) (acquired), right foot: Secondary | ICD-10-CM

## 2022-03-28 NOTE — Progress Notes (Signed)
Subjective:   Patient ID: Alex Martin, male   DOB: 82 y.o.   MRN: [id number]   HPI Patient states doing very well with surgery very pleased so far   ROS      Objective:  Physical Exam  Neurovascular status intact negative Bevelyn Buckles' sign noted wound edges right second digit hallux healing well on the right foot with minimal pain good alignment noted     Assessment:  Satisfactory recovery from bone resection hallux and second digit right foot     Plan:  H&P reviewed and recommended continued immobilization elevation compression sterile dressing reapplied and reappoint 2 weeks suture removal earlier if needed  X-rays taken today reveal adequate bone resection good alignment noted

## 2022-04-12 ENCOUNTER — Ambulatory Visit (INDEPENDENT_AMBULATORY_CARE_PROVIDER_SITE_OTHER): Payer: Medicare PPO

## 2022-04-12 DIAGNOSIS — M2041 Other hammer toe(s) (acquired), right foot: Secondary | ICD-10-CM

## 2022-04-12 NOTE — Progress Notes (Signed)
Patient presents today for post op visit # 2, patient of Dr. Paulla Dolly.   POV # 2 DOS 03/22/22    Sutures removed today.  Incisions look good and no signs of infections. Patient states "I feel like everything went well". No concerns voiced today.   Reviewed icing and elevation. Patient will follow up with Dr. Paulla Dolly as needed.

## 2022-04-14 ENCOUNTER — Telehealth: Payer: Self-pay | Admitting: *Deleted

## 2022-04-14 ENCOUNTER — Other Ambulatory Visit: Payer: Self-pay | Admitting: Podiatry

## 2022-04-14 MED ORDER — DOXYCYCLINE HYCLATE 100 MG PO TABS: 100.0000 mg | ORAL_TABLET | Freq: Two times a day (BID) | ORAL | 1 refills | Status: DC

## 2022-04-14 NOTE — Telephone Encounter (Signed)
Sent in prescription 

## 2022-04-14 NOTE — Telephone Encounter (Signed)
Patient came in today to have his right 2nd toe evaluated. Dr Paulla Dolly examine the toe and will be sending in an antibiotic to his pharmacy on file. Patient verbalized understanding.

## 2022-04-17 ENCOUNTER — Other Ambulatory Visit: Payer: Self-pay | Admitting: Cardiology

## 2022-04-28 ENCOUNTER — Ambulatory Visit: Payer: Medicare PPO | Attending: Cardiology | Admitting: Cardiology

## 2022-04-28 ENCOUNTER — Encounter: Payer: Self-pay | Admitting: Cardiology

## 2022-04-28 VITALS — BP 140/70 | HR 57 | Ht 68.0 in | Wt 167.0 lb

## 2022-04-28 DIAGNOSIS — I10 Essential (primary) hypertension: Secondary | ICD-10-CM | POA: Diagnosis not present

## 2022-04-28 DIAGNOSIS — I252 Old myocardial infarction: Secondary | ICD-10-CM

## 2022-04-28 DIAGNOSIS — Z951 Presence of aortocoronary bypass graft: Secondary | ICD-10-CM | POA: Diagnosis not present

## 2022-04-28 DIAGNOSIS — I2510 Atherosclerotic heart disease of native coronary artery without angina pectoris: Secondary | ICD-10-CM

## 2022-04-28 NOTE — Progress Notes (Signed)
Cardiology Office Note:    Date:  04/28/2022   ID:  Alex Martin, DOB 18-Sep-1940, MRN [id number]  PCP:  Donita Brooks, MD   [facility] Providers Cardiologist:  Donato Schultz, MD     Referring MD: Donita Brooks, MD    History of Present Illness:    Alex Martin is a 82 y.o. male here for the follow-up of coronary artery disease.  Post CABG 2020 in the setting of non-ST elevation infarction, x4 LIMA to LAD free RIMA to OM, sequential SVG to PDA and PL   Brief postop atrial fibrillation..  Enjoys pickleball.  He is hindered by hammertoe at this point.  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   Feels well no chest pain fevers chills nausea vomiting syncope bleeding.  Past Medical History:  Diagnosis Date   Abdominal distension    UMBILICAL HERNIA-NO PAIN   Aortic insufficiency 02/23/2021   Echocardiogram 06/13/2018:  EF 60-65, mild LVH, Gr 1 DD, no RWMA, normla RVSF, severe LAE, mild AI   Bleeding from the nose    SEVERAL EPISODES THAT REQUIRED TX IN ER--LAST EPISODE WAS APPROX 1 YR AGO   Blood in urine    MICROSCOPIC BLOODEVALUATED BY DR. NESI FRIDAY 02/11/11--URINE SAMPLE AND EXAM-- AND BLOOD SAMPLE AND TO BE SEEN AGAIN IN 6 MONTHS AND GIVEN VESICARE  FOR FREQUENCY AND URGENCY   CAD (coronary artery disease) 02/22/2021   NSTEMI in 01/2018 s/p CABG Post CABG AFib   Carotid artery disease 02/22/2021   Pre CABG Dopplers 02/02/2018:  Bilat ICA 1-39   Coronary artery disease    GERD (gastroesophageal reflux disease)    Hyperlipidemia    Hypertension    Ischemic cardiomyopathy 02/22/2021   EF ~ 30 >> Improved to normal  Echocardiogram 06/13/2018:  EF 60-65, mild LVH, Gr 1 DD, no RWMA, normla RVSF, severe LAE, mild AI Echocardiogram 02/02/2018:  Mod LVH, EF 30-35, WMA c/w LAD infarct, normal RVSF, mild pulmonary hypertension     Past Surgical History:  Procedure Laterality Date   CARDIAC CATHETERIZATION     CORONARY ARTERY BYPASS GRAFT N/A  02/05/2018   Procedure: CORONARY ARTERY BYPASS GRAFTING (CABG) x4 USING BILATERAL IMA'S WITH LEFT GREATER SAPHENOUS VEIN HARVEST AND EXPLORATION OF RIGHT SAPHENOUS;  Surgeon: Alleen Borne, MD;  Location: [facility] OR;  Service: Open Heart Surgery;  Laterality: N/A;   HERNIA REPAIR  1959   [facility]   LEFT HEART CATH AND CORONARY ANGIOGRAPHY N/A 02/02/2018   Procedure: LEFT HEART CATH AND CORONARY ANGIOGRAPHY;  Surgeon: [place], Peter M, MD;  Location: [facility] INVASIVE CV LAB;  Service: Cardiovascular;  Laterality: N/A;   TEE WITHOUT CARDIOVERSION N/A 02/05/2018   Procedure: TRANSESOPHAGEAL ECHOCARDIOGRAM (TEE);  Surgeon: Alleen Borne, MD;  Location: [facility] OR;  Service: Open Heart Surgery;  Laterality: N/A;   UMBILICAL HERNIA REPAIR  02/15/2011   Procedure: HERNIA REPAIR UMBILICAL ADULT;  Surgeon: Adolph Pollack, MD;  Location: [facility] ORS;  Service: General;  Laterality: N/A;  umbilical hernia repair with mesh   VASECTOMY  1976    Current Medications: Current Meds  Medication Sig   aspirin EC 81 MG tablet Take 81 mg by mouth daily.   Cyanocobalamin (VITAMIN B-12 PO) Take 1 tablet by mouth daily.   doxazosin (CARDURA) 2 MG tablet TAKE 1 TABLET BY MOUTH EVERY DAY   esomeprazole (NEXIUM) 40 MG capsule TAKE 1 CAPSULE (40 MG TOTAL) BY MOUTH DAILY.   hydrochlorothiazide (HYDRODIURIL)  25 MG tablet TAKE 1 TABLET BY MOUTH EVERY DAY   latanoprost (XALATAN) 0.005 % ophthalmic solution Place 1 drop into both eyes at bedtime.    losartan (COZAAR) 100 MG tablet TAKE 1 TABLET BY MOUTH EVERY DAY   metoprolol succinate (TOPROL-XL) 25 MG 24 hr tablet TAKE 1 TABLET BY MOUTH EVERY DAY   Multiple Vitamin (MULTIVITAMIN) tablet Take 1 tablet by mouth daily.   ondansetron (ZOFRAN-ODT) 4 MG disintegrating tablet Take 1 tablet (4 mg total) by mouth every 8 (eight) hours as needed for nausea or vomiting.   rosuvastatin (CRESTOR) 40 MG tablet TAKE 1 TABLET BY MOUTH EVERY DAY   solifenacin (VESICARE) 5 MG tablet Take 5 mg by mouth daily.    timolol (TIMOPTIC) 0.5 % ophthalmic solution Place 1 drop into both eyes daily.     Allergies:   Amlodipine and Shellfish allergy   Social History   Socioeconomic History   Marital status: Divorced    Spouse name: Not on file   Number of children: Not on file   Years of education: Not on file   Highest education level: Bachelor's degree (e.g., BA, AB, BS)  Occupational History   Not on file  Tobacco Use   Smoking status: Former    Years: 10    Types: Cigarettes    Quit date: 02/22/1992    Years since quitting: 30.2   Smokeless tobacco: Never   Tobacco comments:    QUIT SMOKING ABOUT 1994 - 1 PP WEEK  Vaping Use   Vaping Use: Never used  Substance and Sexual Activity   Alcohol use: Yes    Comment: 2 GLASSES WINE DAILY   Drug use: No   Sexual activity: Not on file  Other Topics Concern   Not on file  Social History Narrative   Not on file   Social Determinants of Health   Financial Resource Strain: Low Risk  (03/29/2018)   Overall Financial Resource Strain (CARDIA)    Difficulty of Paying Living Expenses: Not very hard  Food Insecurity: No Food Insecurity (03/29/2018)   Hunger Vital Sign    Worried About Running Out of Food in the Last Year: Never true    Ran Out of Food in the Last Year: Never true  Transportation Needs: No Transportation Needs (03/29/2018)   PRAPARE - Administrator, Civil Service (Medical): No    Lack of Transportation (Non-Medical): No  Physical Activity: Sufficiently Active (03/29/2018)   Exercise Vital Sign    Days of Exercise per Week: 5 days    Minutes of Exercise per Session: 50 min  Stress: No Stress Concern Present (03/29/2018)   [employer] of Occupational Health - Occupational Stress Questionnaire    Feeling of Stress : Only a little  Social Connections: Not on file     Family History: The patient's family history includes Cancer in his sister; Heart disease in his father and mother. There is no history of Esophageal  cancer, Stomach cancer, or Pancreatic cancer.  ROS:   Please see the history of present illness.     All other systems reviewed and are negative.  EKGs/Labs/Other Studies Reviewed:    The following studies were reviewed today: Cardiac Studies & Procedures   CARDIAC CATHETERIZATION  CARDIAC CATHETERIZATION 02/02/2018  Narrative  There is severe left ventricular systolic dysfunction.  LV end diastolic pressure is moderately elevated.  The left ventricular ejection fraction is 25-35% by visual estimate.  There is trivial (1+) mitral regurgitation.  Dist  LM lesion is 50% stenosed.  Prox LAD lesion is 95% stenosed.  Ost Cx to Prox Cx lesion is 90% stenosed.  Mid RCA lesion is 75% stenosed.  Ost RPDA to RPDA lesion is 90% stenosed.  1. Severe 3 vessel CAD with some distal left main disease as well 2. Severe LV dysfunction 3. Moderately elevated LVEDP  Plan: recommend CT surgery consult for CABG. Resume IV heparin. Start IV Ntg. Diuresis. Currently he is pain free.  Findings Coronary Findings Diagnostic  Dominance: Right  Left Main Dist LM lesion is 50% stenosed. The lesion is discrete.  Left Anterior Descending There is a dual LAD with a large primarily septal branch and a large diagonal branch. Prox LAD lesion is 95% stenosed.  Left Circumflex Ost Cx to Prox Cx lesion is 90% stenosed.  Right Coronary Artery Mid RCA lesion is 75% stenosed.  Right Posterior Descending Artery Ost RPDA to RPDA lesion is 90% stenosed.  Intervention  No interventions have been documented.     ECHOCARDIOGRAM  ECHOCARDIOGRAM COMPLETE 06/13/2018  Narrative ECHOCARDIOGRAM REPORT    Patient Name:   REDA MANZOOR Date of Exam: 06/13/2018 Medical Rec #:  [id number]        Height:       66.5 in Accession #:    [id number]       Weight:       161.8 lb Date of Birth:  [birth year]       BSA:          1.84 m Patient Age:    77 years         BP:           130/70 mmHg Patient  Gender: M                HR:           65 bpm. Exam Location:  [address]   Procedure: 2D Echo, 3D Echo, Cardiac Doppler and Color Doppler  Indications:    Z95.1 CABG  History:        Patient has prior history of Echocardiogram examinations, most recent 02/05/2018. CHF CAD and Previous Myocardial Infarction Prior CABG Atrial Fibrillation Signs/Symptoms: Chest Pain Risk Factors: Family History of Coronary Artery Disease, Hypertension, Dyslipidemia and Former Smoker. Myocardial Infarction with CABG 02-05-2018.  Sonographer:    Farrel Conners RDCS Referring Phys: [id number] Lister C Edman Lipsey  IMPRESSIONS   1. The left ventricle has normal systolic function with an ejection fraction of 60-65%. The cavity size was normal. There is mildly increased left ventricular wall thickness. Left ventricular diastolic Doppler parameters are consistent with impaired relaxation. Indeterminate filling pressures The E/e' is 8-15. No evidence of left ventricular regional wall motion abnormalities. 2. The right ventricle has normal systolic function. The cavity was normal. There is no increase in right ventricular wall thickness. 3. Left atrial size was severely dilated. 4. The mitral valve is abnormal. Mild thickening of the mitral valve leaflet. 5. The tricuspid valve is grossly normal. 6. The aortic valve is tricuspid. Mild sclerosis of the aortic valve. Aortic valve regurgitation is mild by color flow Doppler. No stenosis of the aortic valve. 7. The aortic root and ascending aorta are normal in size and structure. 8. The interatrial septum is aneurysmal.  SUMMARY  LVEF 60-65%, mild LVH, normal wall motion, grade 1 DD, indeterminate LV filling pressure, aortic sclerosis with mild AI, trivial MR, severe LAE, mild TR, normal RVSP, normal IVC, aneurysmal IAS without shunting by color doppler  FINDINGS Left Ventricle: The left ventricle has normal systolic function, with an ejection fraction of 60-65%. The  cavity size was normal. There is mildly increased left ventricular wall thickness. Left ventricular diastolic Doppler parameters are consistent with impaired relaxation. Indeterminate filling pressures The E/e' is 8-15. No evidence of left ventricular regional wall motion abnormalities..  Right Ventricle: The right ventricle has normal systolic function. The cavity was normal. There is no increase in right ventricular wall thickness.  Left Atrium: Left atrial size was severely dilated.  Right Atrium: Right atrial size was normal in size. Right atrial pressure is estimated at 3 mmHg.  Interatrial Septum: No atrial level shunt detected by color flow Doppler. An The interatrial septum is aneurysmal is seen.  Pericardium: There is no evidence of pericardial effusion.  Mitral Valve: The mitral valve is abnormal. Mild thickening of the mitral valve leaflet. Mitral valve regurgitation is trivial by color flow Doppler.  Tricuspid Valve: The tricuspid valve is grossly normal. Tricuspid valve regurgitation is mild by color flow Doppler.  Aortic Valve: The aortic valve is tricuspid Mild sclerosis of the aortic valve. Aortic valve regurgitation is mild by color flow Doppler. There is No stenosis of the aortic valve.  Pulmonic Valve: The pulmonic valve was grossly normal. Pulmonic valve regurgitation is trivial by color flow Doppler.  Aorta: The aortic root and ascending aorta are normal in size and structure.  Venous: The inferior vena cava measures 3.00 cm, is normal in size with greater than 50% respiratory variability.   +--------------+--------++ LEFT VENTRICLE         +----------------+----------++ +--------------+--------++ Diastology                 PLAX 2D                +----------------+----------++ +--------------+--------++ LV e' lateral:  10.60 cm/s LVIDd:        3.63 cm  +----------------+----------++ +--------------+--------++ LV E/e' lateral:9.8         LVIDs:        2.24 cm  +----------------+----------++ +--------------+--------++ LV e' medial:   5.11 cm/s  LV PW:        1.06 cm  +----------------+----------++ +--------------+--------++ LV E/e' medial: 20.4       LV IVS:       1.09 cm  +----------------+----------++ +--------------+--------++ LVOT diam:    2.00 cm  +--------------+--------++ LV SV:        39 ml    +--------------+--------++ LV SV Index:  20.65    +--------------+--------++ LVOT Area:    3.14 cm +--------------+--------++                        +--------------+--------++  +---------------+----------++ RIGHT VENTRICLE           +---------------+----------++ RV S prime:    11.20 cm/s +---------------+----------++ TAPSE (M-mode):2.0 cm     +---------------+----------++  +---------------+-------++-----------++ LEFT ATRIUM           Index       +---------------+-------++-----------++ LA diam:       4.90 cm2.67 cm/m  +---------------+-------++-----------++ LA Vol (A2C):  99.7 ml54.25 ml/m +---------------+-------++-----------++ LA Vol (A4C):  88.0 ml47.88 ml/m +---------------+-------++-----------++ LA Biplane Vol:98.1 ml53.38 ml/m +---------------+-------++-----------++ +------------+---------++-----------++ RIGHT ATRIUM         Index       +------------+---------++-----------++ RA Area:    17.10 cm            +------------+---------++-----------++ RA Volume:  44.60 ml 24.27 ml/m +------------+---------++-----------++ +------------+-----------++ AORTIC  VALVE            +------------+-----------++ LVOT Vmax:  123.00 cm/s +------------+-----------++ LVOT Vmean: 84.700 cm/s +------------+-----------++ LVOT VTI:   0.282 m     +------------+-----------++ AR PHT:     385 msec    +------------+-----------++  +-------------+-------++ AORTA                 +-------------+-------++ Ao Root diam:3.90 cm +-------------+-------++ Ao Asc diam: 3.70 cm +-------------+-------++  +--------------+--------++    +---------------+-----------++ MITRAL VALVE              TRICUSPID VALVE            +--------------+--------++    +---------------+-----------++ MV Area (PHT):cm         TR Peak grad:  31.5 mmHg   +--------------+--------++    +---------------+-----------++ MV PHT:       msec        TR Vmax:       312.00 cm/s +--------------+--------++    +---------------+-----------++ MV Decel Time:292 msec +--------------+--------++    +--------------+-------+ +--------------+-----------++ SHUNTS                MV E velocity:104.00 cm/s +--------------+-------+ +--------------+-----------++ Systemic VTI: 0.28 m  MV A velocity:107.50 cm/s +--------------+-------+ +--------------+-----------++ Systemic Diam:2.00 cm MV E/A ratio: 0.97        +--------------+-------+ +--------------+-----------++  +---------+-------+ IVC              +---------+-------+ IVC diam:3.00 cm +---------+-------+   Zoila Shutter MD Electronically signed by Zoila Shutter MD Signature Date/Time: 06/13/2018/1:21:40 PM    Final   TEE  ECHO TEE 02/05/2018  Interpretation Summary  Septum: Small interatrial septal defect present with left to right shunting.  Left atrium: Small interatrial septal defect present with left to right shunting.  Aortic valve: The valve is trileaflet. Mild valve thickening present. No stenosis. Mild to moderate regurgitation.  Aorta: The aortic root is mildly dilated at the sinus of Valsalva.  Mitral valve: Mild leaflet thickening is present.  Right ventricle: Normal cavity size, wall thickness and ejection fraction.             EKG: 04/28/2022-sinus bradycardia 57 first-degree AV block 234 ms.  Recent Labs: 12/29/2021: ALT 17; BUN 24; Creat 1.26; Hemoglobin 15.5;  Platelets 190; Potassium 4.7; Sodium 140  Recent Lipid Panel    Component Value Date/Time   CHOL 136 12/29/2021 0814   CHOL 141 03/10/2020 0746   TRIG 96 12/29/2021 0814   HDL 57 12/29/2021 0814   HDL 52 03/10/2020 0746   CHOLHDL 2.4 12/29/2021 0814   VLDL 25 02/03/2018 0500   LDLCALC 61 12/29/2021 0814     Risk Assessment/Calculations:              Physical Exam:    VS:  BP (!) 140/70 (BP Location: Left Arm, Patient Position: Sitting, Cuff Size: Normal)   Pulse (!) 57   Ht 5\' 8"  (1.727 m)   Wt 167 lb (75.8 kg)   BMI 25.39 kg/m     Wt Readings from Last 3 Encounters:  04/28/22 167 lb (75.8 kg)  05/11/21 177 lb (80.3 kg)  04/04/21 175 lb (79.4 kg)     GEN:  Well nourished, well developed in no acute distress HEENT: Normal NECK: No JVD; No carotid bruits LYMPHATICS: No lymphadenopathy CARDIAC: RRR, no murmurs, rubs, gallops RESPIRATORY:  Clear to auscultation without rales, wheezing or rhonchi  ABDOMEN: Soft, non-tender, non-distended MUSCULOSKELETAL:  No edema; No deformity  SKIN: Warm and  dry NEUROLOGIC:  Alert and oriented x 3 PSYCHIATRIC:  Normal affect   ASSESSMENT:    1. Essential hypertension   2. Coronary artery disease involving native coronary artery of native heart without angina pectoris   3. Old MI (myocardial infarction)   4. S/P CABG x 4    PLAN:    In order of problems listed above:  CAD (coronary artery disease) History of non-STEMI followed by CABG in 2020.  He is doing well without anginal symptoms.  Continue aspirin 81 mg daily, metoprolol succinate 25 mg daily, rosuvastatin 40 mg daily.  Follow-up in 1 year.  No changes made.   Ischemic cardiomyopathy EF at the time of his myocardial infarction in 2020 was 30%.  This improved to normal.  He is doing well without symptoms to suggest volume excess/CHF.  Continue losartan 100 mg daily, metoprolol succinate 25 mg daily.  No changes made.  Continue with current medication management    Essential hypertension Blood pressure is well controlled.   HCTZ 25 mg daily, losartan 100 mg daily, metoprolol succinate 25 mg daily.   Dyslipidemia, goal LDL below 70 Goal LDL is <70.  Continue rosuvastatin 40 mg daily.  LDL 61 from outside labs.  Hemoglobin 15.5 creatinine 1.26.   1 year follow-up.         Medication Adjustments/Labs and Tests Ordered: Current medicines are reviewed at length with the patient today.  Concerns regarding medicines are outlined above.  Orders Placed This Encounter  Procedures   EKG 12-Lead   No orders of the defined types were placed in this encounter.   Patient Instructions  Medication Instructions:  The current medical regimen is effective;  continue present plan and medications.  *If you need a refill on your cardiac medications before your next appointment, please call your pharmacy*   Follow-Up: At [facility], you and your health needs are our priority.  As part of our continuing mission to provide you with exceptional heart care, we have created designated Provider Care Teams.  These Care Teams include your primary Cardiologist (physician) and Advanced Practice Providers (APPs -  Physician Assistants and Nurse Practitioners) who all work together to provide you with the care you need, when you need it.  We recommend signing up for the patient portal called "MyChart".  Sign up information is provided on this After Visit Summary.  MyChart is used to connect with patients for Virtual Visits (Telemedicine).  Patients are able to view lab/test results, encounter notes, upcoming appointments, etc.  Non-urgent messages can be sent to your provider as well.   To learn more about what you can do with MyChart, go to [email or web address].    Your next appointment:   1 year(s)  Provider:   Donato Schultz, MD        Signed, Donato Schultz, MD  04/28/2022 9:50 AM    [facility]

## 2022-04-28 NOTE — Patient Instructions (Signed)
Medication Instructions:  The current medical regimen is effective;  continue present plan and medications.  *If you need a refill on your cardiac medications before your next appointment, please call your pharmacy*  Follow-Up: At [facility], you and your health needs are our priority.  As part of our continuing mission to provide you with exceptional heart care, we have created designated Provider Care Teams.  These Care Teams include your primary Cardiologist (physician) and Advanced Practice Providers (APPs -  Physician Assistants and Nurse Practitioners) who all work together to provide you with the care you need, when you need it.  We recommend signing up for the patient portal called "MyChart".  Sign up information is provided on this After Visit Summary.  MyChart is used to connect with patients for Virtual Visits (Telemedicine).  Patients are able to view lab/test results, encounter notes, upcoming appointments, etc.  Non-urgent messages can be sent to your provider as well.   To learn more about what you can do with MyChart, go to [email or web address].    Your next appointment:   1 year(s)  Provider:   Mark Skains, MD      

## 2022-05-09 ENCOUNTER — Other Ambulatory Visit: Payer: Self-pay | Admitting: Family Medicine

## 2022-05-10 ENCOUNTER — Other Ambulatory Visit: Payer: Medicare PPO

## 2022-05-10 DIAGNOSIS — Z125 Encounter for screening for malignant neoplasm of prostate: Secondary | ICD-10-CM

## 2022-05-10 DIAGNOSIS — I10 Essential (primary) hypertension: Secondary | ICD-10-CM

## 2022-05-10 DIAGNOSIS — E7800 Pure hypercholesterolemia, unspecified: Secondary | ICD-10-CM

## 2022-05-10 DIAGNOSIS — I2510 Atherosclerotic heart disease of native coronary artery without angina pectoris: Secondary | ICD-10-CM

## 2022-05-10 LAB — CBC WITH DIFFERENTIAL/PLATELET
Absolute Monocytes: 738 cells/uL (ref 200–950)
Basophils Absolute: 71 cells/uL (ref 0–200)
Eosinophils Absolute: 419 cells/uL (ref 15–500)
Lymphs Abs: 1853 cells/uL (ref 850–3900)
MCH: 32.5 pg (ref 27.0–33.0)
MCV: 98.3 fL (ref 80.0–100.0)
MPV: 10.5 fL (ref 7.5–12.5)
Neutro Abs: 2820 cells/uL (ref 1500–7800)
Platelets: 200 10*3/uL (ref 140–400)
Total Lymphocyte: 31.4 %

## 2022-05-11 LAB — CBC WITH DIFFERENTIAL/PLATELET
Basophils Relative: 1.2 %
Eosinophils Relative: 7.1 %
HCT: 46.9 % (ref 38.5–50.0)
Hemoglobin: 15.5 g/dL (ref 13.2–17.1)
MCHC: 33 g/dL (ref 32.0–36.0)
Monocytes Relative: 12.5 %
Neutrophils Relative %: 47.8 %
RBC: 4.77 10*6/uL (ref 4.20–5.80)
RDW: 12.2 % (ref 11.0–15.0)
WBC: 5.9 10*3/uL (ref 3.8–10.8)

## 2022-05-11 LAB — LIPID PANEL
Cholesterol: 151 mg/dL (ref <200)
HDL: 55 mg/dL (ref >=40)
LDL Cholesterol (Calc): 77 mg/dL (calc)
Non-HDL Cholesterol (Calc): 96 mg/dL (calc) (ref <130)
Total CHOL/HDL Ratio: 2.7 (calc) (ref <5.0)
Triglycerides: 104 mg/dL (ref <150)

## 2022-05-11 LAB — COMPLETE METABOLIC PANEL WITH GFR
AG Ratio: 1.5 (calc) (ref 1.0–2.5)
ALT: 22 U/L (ref 9–46)
AST: 22 U/L (ref 10–35)
Albumin: 4 g/dL (ref 3.6–5.1)
Alkaline phosphatase (APISO): 61 U/L (ref 35–144)
BUN/Creatinine Ratio: 24 (calc) — ABNORMAL HIGH (ref 6–22)
BUN: 27 mg/dL — ABNORMAL HIGH (ref 7–25)
CO2: 27 mmol/L (ref 20–32)
Calcium: 9.2 mg/dL (ref 8.6–10.3)
Chloride: 102 mmol/L (ref 98–110)
Creat: 1.12 mg/dL (ref 0.70–1.22)
Globulin: 2.6 g/dL (calc) (ref 1.9–3.7)
Glucose, Bld: 110 mg/dL — ABNORMAL HIGH (ref 65–99)
Potassium: 5.2 mmol/L (ref 3.5–5.3)
Sodium: 140 mmol/L (ref 135–146)
Total Bilirubin: 0.7 mg/dL (ref 0.2–1.2)
Total Protein: 6.6 g/dL (ref 6.1–8.1)
eGFR: 66 mL/min/{1.73_m2} (ref >=60)

## 2022-05-11 LAB — PSA: PSA: 0.29 ng/mL (ref <=4.00)

## 2022-05-16 ENCOUNTER — Ambulatory Visit (INDEPENDENT_AMBULATORY_CARE_PROVIDER_SITE_OTHER): Payer: Medicare PPO | Admitting: Family Medicine

## 2022-05-16 VITALS — BP 126/78 | HR 53 | Temp 97.9°F | Ht 68.0 in | Wt 166.8 lb

## 2022-05-16 DIAGNOSIS — E7800 Pure hypercholesterolemia, unspecified: Secondary | ICD-10-CM

## 2022-05-16 DIAGNOSIS — Z951 Presence of aortocoronary bypass graft: Secondary | ICD-10-CM

## 2022-05-16 DIAGNOSIS — I10 Essential (primary) hypertension: Secondary | ICD-10-CM | POA: Diagnosis not present

## 2022-05-16 DIAGNOSIS — Z125 Encounter for screening for malignant neoplasm of prostate: Secondary | ICD-10-CM | POA: Diagnosis not present

## 2022-05-16 DIAGNOSIS — Z0000 Encounter for general adult medical examination without abnormal findings: Secondary | ICD-10-CM

## 2022-05-16 DIAGNOSIS — Z0001 Encounter for general adult medical examination with abnormal findings: Secondary | ICD-10-CM

## 2022-05-16 NOTE — Progress Notes (Signed)
1  Subjective:    Patient ID: Alex Martin, male    DOB: 1940/09/18, 82 y.o.   MRN: [id number]  HPI Patient is a very pleasant 82 year old Caucasian male who presents today for complete physical exam. In 2019, he suffered a non-ST elevation myocardial infarction.  He underwent coronary artery bypass grafting x4.  He denies any chest pain shortness of breath or dyspnea on exertion today.  Patient had a colonoscopy 2021 and they found several polyps.  They recommended a repeat colonoscopy in 3 years (2024).  He also had an EGD which showed changes consistent with Barrett's esophagus.  However he is over 81 and at this point I would not recommend further colonoscopies.  His PSA is outstanding.  Overall he is doing well.  He denies any memory issues or depression.  He denies any recent falls.  He is due for a tetanus shot as well as his second shingles vaccine Immunization History  Administered Date(s) Administered   COVID-19, mRNA, vaccine(Comirnaty)12 years and older 11/15/2021   Fluad Quad(high Dose 65+) 09/14/2021   Influenza, High Dose Seasonal PF 11/01/2012, 10/14/2015, 09/17/2016, 09/15/2017, 09/11/2018, 09/11/2018, 09/25/2019   Influenza,inj,Quad PF,6+ Mos 10/29/2014   Influenza-Unspecified 10/04/2013, 10/14/2015, 09/21/2017, 09/25/2018, 09/25/2019   Moderna SARS-COV2 Booster Vaccination 11/11/2019   Moderna Sars-Covid-2 Vaccination 01/31/2019, 02/28/2019, 04/16/2020   PNEUMOCOCCAL CONJUGATE-20 04/06/2020   Pfizer Covid-19 Vaccine Bivalent Booster 72yrs & up 09/14/2021   Pneumococcal Conjugate-13 10/29/2014   Pneumococcal Polysaccharide-23 06/30/2016   Respiratory Syncytial Virus Vaccine,Recomb Aduvanted(Arexvy) 11/23/2021   Tdap 06/17/2012   Zoster Recombinat (Shingrix) 11/16/2017   Zoster, Live 01/18/2007    Past Medical History:  Diagnosis Date   Abdominal distension    UMBILICAL HERNIA-NO PAIN   Aortic insufficiency 02/23/2021   Echocardiogram 06/13/2018:  EF 60-65, mild LVH,  Gr 1 DD, no RWMA, normla RVSF, severe LAE, mild AI   Bleeding from the nose    SEVERAL EPISODES THAT REQUIRED TX IN ER--LAST EPISODE WAS APPROX 1 YR AGO   Blood in urine    MICROSCOPIC BLOODEVALUATED BY DR. NESI FRIDAY 02/11/11--URINE SAMPLE AND EXAM-- AND BLOOD SAMPLE AND TO BE SEEN AGAIN IN 6 MONTHS AND GIVEN VESICARE  FOR FREQUENCY AND URGENCY   CAD (coronary artery disease) 02/22/2021   NSTEMI in 01/2018 s/p CABG Post CABG AFib   Carotid artery disease (HCC) 02/22/2021   Pre CABG Dopplers 02/02/2018:  Bilat ICA 1-39   Coronary artery disease    GERD (gastroesophageal reflux disease)    Hyperlipidemia    Hypertension    Ischemic cardiomyopathy 02/22/2021   EF ~ 30 >> Improved to normal  Echocardiogram 06/13/2018:  EF 60-65, mild LVH, Gr 1 DD, no RWMA, normla RVSF, severe LAE, mild AI Echocardiogram 02/02/2018:  Mod LVH, EF 30-35, WMA c/w LAD infarct, normal RVSF, mild pulmonary hypertension    Past Surgical History:  Procedure Laterality Date   CARDIAC CATHETERIZATION     CORONARY ARTERY BYPASS GRAFT N/A 02/05/2018   Procedure: CORONARY ARTERY BYPASS GRAFTING (CABG) x4 USING BILATERAL IMA'S WITH LEFT GREATER SAPHENOUS VEIN HARVEST AND EXPLORATION OF RIGHT SAPHENOUS;  Surgeon: Alleen Borne, MD;  Location: [facility] OR;  Service: Open Heart Surgery;  Laterality: N/A;   HERNIA REPAIR  1959   [facility]   LEFT HEART CATH AND CORONARY ANGIOGRAPHY N/A 02/02/2018   Procedure: LEFT HEART CATH AND CORONARY ANGIOGRAPHY;  Surgeon: [place], Peter M, MD;  Location: [facility] INVASIVE CV LAB;  Service: Cardiovascular;  Laterality: N/A;   TEE WITHOUT CARDIOVERSION N/A  02/05/2018   Procedure: TRANSESOPHAGEAL ECHOCARDIOGRAM (TEE);  Surgeon: Alleen Borne, MD;  Location: [facility] OR;  Service: Open Heart Surgery;  Laterality: N/A;   UMBILICAL HERNIA REPAIR  02/15/2011   Procedure: HERNIA REPAIR UMBILICAL ADULT;  Surgeon: Adolph Pollack, MD;  Location: [facility] ORS;  Service: General;  Laterality: N/A;  umbilical hernia repair with mesh    VASECTOMY  1976   Current Outpatient Medications on File Prior to Visit  Medication Sig Dispense Refill   aspirin EC 81 MG tablet Take 81 mg by mouth daily.     Cyanocobalamin (VITAMIN B-12 PO) Take 1 tablet by mouth daily.     doxazosin (CARDURA) 2 MG tablet TAKE 1 TABLET BY MOUTH EVERY DAY 90 tablet 1   esomeprazole (NEXIUM) 40 MG capsule TAKE 1 CAPSULE (40 MG TOTAL) BY MOUTH DAILY. 90 capsule 0   hydrochlorothiazide (HYDRODIURIL) 25 MG tablet TAKE 1 TABLET BY MOUTH EVERY DAY 90 tablet 3   latanoprost (XALATAN) 0.005 % ophthalmic solution Place 1 drop into both eyes at bedtime.   0   losartan (COZAAR) 100 MG tablet TAKE 1 TABLET BY MOUTH EVERY DAY 30 tablet 0   metoprolol succinate (TOPROL-XL) 25 MG 24 hr tablet TAKE 1 TABLET BY MOUTH EVERY DAY 90 tablet 0   Multiple Vitamin (MULTIVITAMIN) tablet Take 1 tablet by mouth daily.     ondansetron (ZOFRAN-ODT) 4 MG disintegrating tablet Take 1 tablet (4 mg total) by mouth every 8 (eight) hours as needed for nausea or vomiting. 8 tablet 0   rosuvastatin (CRESTOR) 40 MG tablet TAKE 1 TABLET BY MOUTH EVERY DAY 90 tablet 1   solifenacin (VESICARE) 5 MG tablet Take 5 mg by mouth daily.     timolol (TIMOPTIC) 0.5 % ophthalmic solution Place 1 drop into both eyes daily.     No current facility-administered medications on file prior to visit.   Allergies  Allergen Reactions   Amlodipine Swelling    swelling in ankles   Shellfish Allergy Other (See Comments)    N&V AFTER EATING MUSSELS   Social History   Socioeconomic History   Marital status: Divorced    Spouse name: Not on file   Number of children: Not on file   Years of education: Not on file   Highest education level: Bachelor's degree (e.g., BA, AB, BS)  Occupational History   Not on file  Tobacco Use   Smoking status: Former    Years: 10    Types: Cigarettes    Quit date: 02/22/1992    Years since quitting: 30.2   Smokeless tobacco: Never   Tobacco comments:    QUIT SMOKING ABOUT  1994 - 1 PP WEEK  Vaping Use   Vaping Use: Never used  Substance and Sexual Activity   Alcohol use: Yes    Comment: 2 GLASSES WINE DAILY   Drug use: No   Sexual activity: Not on file  Other Topics Concern   Not on file  Social History Narrative   Not on file   Social Determinants of Health   Financial Resource Strain: Low Risk  (03/29/2018)   Overall Financial Resource Strain (CARDIA)    Difficulty of Paying Living Expenses: Not very hard  Food Insecurity: No Food Insecurity (03/29/2018)   Hunger Vital Sign    Worried About Running Out of Food in the Last Year: Never true    Ran Out of Food in the Last Year: Never true  Transportation Needs: No Transportation Needs (03/29/2018)  PRAPARE - Administrator, Civil Service (Medical): No    Lack of Transportation (Non-Medical): No  Physical Activity: Sufficiently Active (03/29/2018)   Exercise Vital Sign    Days of Exercise per Week: 5 days    Minutes of Exercise per Session: 50 min  Stress: No Stress Concern Present (03/29/2018)   [employer] of Occupational Health - Occupational Stress Questionnaire    Feeling of Stress : Only a little  Social Connections: Not on file  Intimate Partner Violence: Not on file      Review of Systems  All other systems reviewed and are negative.      Objective:   Physical Exam Vitals reviewed.  Constitutional:      General: He is not in acute distress.    Appearance: He is well-developed. He is not diaphoretic.  HENT:     Head: Normocephalic and atraumatic.     Right Ear: External ear normal.     Left Ear: External ear normal.     Nose: Nose normal.     Mouth/Throat:     Pharynx: No oropharyngeal exudate.  Eyes:     General: No scleral icterus.       Right eye: No discharge.        Left eye: No discharge.     Conjunctiva/sclera: Conjunctivae normal.     Pupils: Pupils are equal, round, and reactive to light.  Neck:     Thyroid: No thyromegaly.     Vascular: No  JVD.     Trachea: No tracheal deviation.  Cardiovascular:     Rate and Rhythm: Normal rate and regular rhythm.     Heart sounds: Normal heart sounds. No murmur heard.    No friction rub. No gallop.  Pulmonary:     Effort: Pulmonary effort is normal. No respiratory distress.     Breath sounds: Normal breath sounds. No stridor. No wheezing or rales.  Chest:     Chest wall: No tenderness.  Abdominal:     General: Bowel sounds are normal. There is no distension.     Palpations: Abdomen is soft. There is no mass.     Tenderness: There is no abdominal tenderness. There is no guarding or rebound.  Musculoskeletal:        General: No tenderness or deformity. Normal range of motion.     Cervical back: Normal range of motion and neck supple.  Lymphadenopathy:     Cervical: No cervical adenopathy.  Skin:    General: Skin is warm.     Coloration: Skin is not pale.     Findings: No erythema or rash.  Neurological:     Mental Status: He is alert and oriented to person, place, and time.     Cranial Nerves: No cranial nerve deficit.     Motor: No abnormal muscle tone.     Coordination: Coordination normal.     Deep Tendon Reflexes: Reflexes are normal and symmetric.  Psychiatric:        Behavior: Behavior normal.        Thought Content: Thought content normal.        Judgment: Judgment normal.           Assessment & Plan:  General medical exam  Essential hypertension  Pure hypercholesterolemia  Prostate cancer screening  S/P CABG x 4 I believe the patient is in excellent health for his age.  I recommended a tetanus shot as well as the shingles vaccine.  Colonoscopy and  prostate cancer screening are up-to-date.  Blood pressure is outstanding.  Sugar slightly elevated.  I recommended decreasing his starch intake and also reducing his beer consumption.  LDL cholesterol 77.  Goal will be less than 55.  He is already on Crestor 40 mg a day.  We discussed adding Zetia but patient  politely declines and will work on his diet.  He will try to eat more fruits and vegetables and consume less starches, fats. Lab on 05/10/2022  Component Date Value Ref Range Status   PSA 05/10/2022 0.29  < OR = 4.00 ng/mL Final   Comment: The total PSA value from this assay system is  standardized against the WHO standard. The test  result will be approximately 20% lower when compared  to the equimolar-standardized total PSA (Beckman  Coulter). Comparison of serial PSA results should be  interpreted with this fact in mind. . This test was performed using the Siemens  chemiluminescent method. Values obtained from  different assay methods cannot be used interchangeably. PSA levels, regardless of value, should not be interpreted as absolute evidence of the presence or absence of disease.    Cholesterol 05/10/2022 151  <200 mg/dL Final   HDL 16/10/9602 55  > OR = 40 mg/dL Final   Triglycerides 54/09/8117 104  <150 mg/dL Final   LDL Cholesterol (Calc) 05/10/2022 77  mg/dL (calc) Final   Comment: Reference range: <100 . Desirable range <100 mg/dL for primary prevention;   <70 mg/dL for patients with CHD or diabetic patients  with > or = 2 CHD risk factors. Marland Kitchen LDL-C is now calculated using the Martin-Hopkins  calculation, which is a validated novel method providing  better accuracy than the Friedewald equation in the  estimation of LDL-C.  Horald Pollen et al. Lenox Ahr. 1478;295(62): 2061-2068  (http://education.[facility].com/faq/FAQ164)    Total CHOL/HDL Ratio 05/10/2022 2.7  <1.3 (calc) Final   Non-HDL Cholesterol (Calc) 05/10/2022 96  <130 mg/dL (calc) Final   Comment: For patients with diabetes plus 1 major ASCVD risk  factor, treating to a non-HDL-C goal of <100 mg/dL  (LDL-C of <08 mg/dL) is considered a therapeutic  option.    Glucose, Bld 05/10/2022 110 (H)  65 - 99 mg/dL Final   Comment: .            Fasting reference interval . For someone without known diabetes, a  glucose value between 100 and 125 mg/dL is consistent with prediabetes and should be confirmed with a follow-up test. .    BUN 05/10/2022 27 (H)  7 - 25 mg/dL Final   Creat 65/78/4696 1.12  0.70 - 1.22 mg/dL Final   eGFR 29/52/8413 66  > OR = 60 mL/min/1.69m2 Final   BUN/Creatinine Ratio 05/10/2022 24 (H)  6 - 22 (calc) Final   Sodium 05/10/2022 140  135 - 146 mmol/L Final   Potassium 05/10/2022 5.2  3.5 - 5.3 mmol/L Final   Chloride 05/10/2022 102  98 - 110 mmol/L Final   CO2 05/10/2022 27  20 - 32 mmol/L Final   Calcium 05/10/2022 9.2  8.6 - 10.3 mg/dL Final   Total Protein 24/40/1027 6.6  6.1 - 8.1 g/dL Final   Albumin 25/36/6440 4.0  3.6 - 5.1 g/dL Final   Globulin 34/74/2595 2.6  1.9 - 3.7 g/dL (calc) Final   AG Ratio 05/10/2022 1.5  1.0 - 2.5 (calc) Final   Total Bilirubin 05/10/2022 0.7  0.2 - 1.2 mg/dL Final   Alkaline phosphatase (APISO) 05/10/2022 61  35 -  144 U/L Final   AST 05/10/2022 22  10 - 35 U/L Final   ALT 05/10/2022 22  9 - 46 U/L Final   WBC 05/10/2022 5.9  3.8 - 10.8 Thousand/uL Final   RBC 05/10/2022 4.77  4.20 - 5.80 Million/uL Final   Hemoglobin 05/10/2022 15.5  13.2 - 17.1 g/dL Final   HCT 16/10/9602 46.9  38.5 - 50.0 % Final   MCV 05/10/2022 98.3  80.0 - 100.0 fL Final   MCH 05/10/2022 32.5  27.0 - 33.0 pg Final   MCHC 05/10/2022 33.0  32.0 - 36.0 g/dL Final   RDW 54/09/8117 12.2  11.0 - 15.0 % Final   Platelets 05/10/2022 200  140 - 400 Thousand/uL Final   MPV 05/10/2022 10.5  7.5 - 12.5 fL Final   Neutro Abs 05/10/2022 2,820  1,500 - 7,800 cells/uL Final   Lymphs Abs 05/10/2022 1,853  850 - 3,900 cells/uL Final   Absolute Monocytes 05/10/2022 738  200 - 950 cells/uL Final   Eosinophils Absolute 05/10/2022 419  15 - 500 cells/uL Final   Basophils Absolute 05/10/2022 71  0 - 200 cells/uL Final   Neutrophils Relative % 05/10/2022 47.8  % Final   Total Lymphocyte 05/10/2022 31.4  % Final   Monocytes Relative 05/10/2022 12.5  % Final   Eosinophils  Relative 05/10/2022 7.1  % Final   Basophils Relative 05/10/2022 1.2  % Final  No diagnosis found.

## 2022-05-17 ENCOUNTER — Telehealth: Payer: Self-pay | Admitting: Family Medicine

## 2022-05-17 DIAGNOSIS — Z0289 Encounter for other administrative examinations: Secondary | ICD-10-CM

## 2022-05-17 NOTE — Telephone Encounter (Signed)
Patient came to office to drop off forms for provider to complete and sign. Forms placed on nurse's desk.   Patient requesting call when forms completed, signed, and ready for pickup.   Please advise at [phone].

## 2022-05-18 ENCOUNTER — Other Ambulatory Visit: Payer: Self-pay | Admitting: Cardiology

## 2022-05-25 ENCOUNTER — Telehealth: Payer: Self-pay | Admitting: Cardiology

## 2022-05-25 NOTE — Telephone Encounter (Signed)
Patient is calling because he is having pain in his left arm that started yesterday.

## 2022-05-25 NOTE — Telephone Encounter (Signed)
Pt called to report that for the past 24 hours he has been having left arm weakness and pain from the elbow to his wrist.. he is unable to pick up normal things around the house.. he denies chest pain, SOB, no radiation to his shoulder, chest or back.   He says it is constant and no recent straining of it or injury. NO recent ir old neck problems.   No audible slurred speech, no weakness of his left lower extremity or facial droop or headache.   I have asked that he talk with his PCP since it is not sounding cardiac in nature but with his h/o MI/ CAD.. I made him an appt with an APP tomorrow morning to be cautious. We went over ED precautions in case any changes or worsens and the pt agreed.   Will forward to Dr Anne Fu for review..he last saw the pt 04/2022.

## 2022-05-26 ENCOUNTER — Encounter: Payer: Self-pay | Admitting: Nurse Practitioner

## 2022-05-26 ENCOUNTER — Ambulatory Visit: Payer: Medicare PPO | Attending: Nurse Practitioner | Admitting: Nurse Practitioner

## 2022-05-26 VITALS — BP 132/64 | HR 57 | Ht 67.0 in | Wt 164.0 lb

## 2022-05-26 DIAGNOSIS — M79602 Pain in left arm: Secondary | ICD-10-CM | POA: Diagnosis not present

## 2022-05-26 DIAGNOSIS — I2510 Atherosclerotic heart disease of native coronary artery without angina pectoris: Secondary | ICD-10-CM | POA: Diagnosis not present

## 2022-05-26 DIAGNOSIS — I10 Essential (primary) hypertension: Secondary | ICD-10-CM

## 2022-05-26 DIAGNOSIS — E785 Hyperlipidemia, unspecified: Secondary | ICD-10-CM | POA: Diagnosis not present

## 2022-05-26 NOTE — Progress Notes (Signed)
Morning   Office Visit    Patient Name: Alex Martin Date of Encounter: 05/26/2022  Primary Care Provider:  Donita Brooks, MD Primary Cardiologist:  Donato Schultz, MD Primary Electrophysiologist: None   Past Medical History    Past Medical History:  Diagnosis Date   Abdominal distension    UMBILICAL HERNIA-NO PAIN   Aortic insufficiency 02/23/2021   Echocardiogram 06/13/2018:  EF 60-65, mild LVH, Gr 1 DD, no RWMA, normla RVSF, severe LAE, mild AI   Bleeding from the nose    SEVERAL EPISODES THAT REQUIRED TX IN ER--LAST EPISODE WAS APPROX 1 YR AGO   Blood in urine    MICROSCOPIC BLOODEVALUATED BY DR. Brunilda Payor FRIDAY 02/11/11--URINE SAMPLE AND EXAM-- AND BLOOD SAMPLE AND TO BE SEEN AGAIN IN 6 MONTHS AND GIVEN VESICARE  FOR FREQUENCY AND URGENCY   CAD (coronary artery disease) 02/22/2021   NSTEMI in 01/2018 s/p CABG Post CABG AFib   Carotid artery disease (HCC) 02/22/2021   Pre CABG Dopplers 02/02/2018:  Bilat ICA 1-39   Coronary artery disease    GERD (gastroesophageal reflux disease)    Hyperlipidemia    Hypertension    Ischemic cardiomyopathy 02/22/2021   EF ~ 30 >> Improved to normal  Echocardiogram 06/13/2018:  EF 60-65, mild LVH, Gr 1 DD, no RWMA, normla RVSF, severe LAE, mild AI Echocardiogram 02/02/2018:  Mod LVH, EF 30-35, WMA c/w LAD infarct, normal RVSF, mild pulmonary hypertension    Past Surgical History:  Procedure Laterality Date   CARDIAC CATHETERIZATION     CORONARY ARTERY BYPASS GRAFT N/A 02/05/2018   Procedure: CORONARY ARTERY BYPASS GRAFTING (CABG) x4 USING BILATERAL IMA'S WITH LEFT GREATER SAPHENOUS VEIN HARVEST AND EXPLORATION OF RIGHT SAPHENOUS;  Surgeon: Alleen Borne, MD;  Location: [facility] OR;  Service: Open Heart Surgery;  Laterality: N/A;   HERNIA REPAIR  1959   [facility]   LEFT HEART CATH AND CORONARY ANGIOGRAPHY N/A 02/02/2018   Procedure: LEFT HEART CATH AND CORONARY ANGIOGRAPHY;  Surgeon: Swaziland, Peter M, MD;  Location: [facility] INVASIVE CV LAB;  Service: Cardiovascular;   Laterality: N/A;   TEE WITHOUT CARDIOVERSION N/A 02/05/2018   Procedure: TRANSESOPHAGEAL ECHOCARDIOGRAM (TEE);  Surgeon: Alleen Borne, MD;  Location: [facility] OR;  Service: Open Heart Surgery;  Laterality: N/A;   UMBILICAL HERNIA REPAIR  02/15/2011   Procedure: HERNIA REPAIR UMBILICAL ADULT;  Surgeon: Adolph Pollack, MD;  Location: [facility] ORS;  Service: General;  Laterality: N/A;  umbilical hernia repair with mesh   VASECTOMY  1976    Allergies  Allergies  Allergen Reactions   Amlodipine Swelling    swelling in ankles   Shellfish Allergy Other (See Comments)    N&V AFTER EATING MUSSELS     History of Present Illness    Alex Martin  is a 82year old male with a PMH of CAD s/p CABG x 4 in 2020 due to NSTEMI, ischemic cardiomyopathy, mild aortic regurgitation, HTN, HLD, former smoker who presents today for complaint of left elbow and arm pain.  Mr. Plude was initially seen in 2020 following NSTEMI requiring CABG x 4 with LIMA to DX, free RIMA to OM, SVG to PD & PL.  2D echo completed showed grade 1 DD with EF of 30-35% with trivial AVR.  Repeat 2D echo 05/2018 with recovered EF of 60-65%.  He experienced a brief postop course of atrial fibrillation that resolved with amiodarone.  He was last seen by Dr. Anne Fu on 04/28/2022 for annual follow-up.  He reported  at that time feeling well with no chest pain or cardiac complaints.  Enjoys playing pickle ball and this was hindered by hammertoe injury.  He was noted to be euvolemic on examination and blood pressure was well-controlled.   Since last being seen in the office patient reports that he is experienced left elbow pain which is his dominant arm when playing pickle ball.  He reported yesterday that he was unable to grip and had limited strength with lifting his arm and moving items throughout the day.  During today's visit he reports no arm pain and denies any anginal equivalents such as chest pain or shortness of breath.  He is staying active and  has resumed playing pickle ball after 2 months.  He reports that his discomfort began once he resumed his activities with pickleball.  EKG today was completed showing no acute changes from previous.  Patient denies chest pain, palpitations, dyspnea, PND, orthopnea, nausea, vomiting, dizziness, syncope, edema, weight gain, or early satiety.   Home Medications    Current Outpatient Medications  Medication Sig Dispense Refill   aspirin EC 81 MG tablet Take 81 mg by mouth daily.     Cyanocobalamin (VITAMIN B-12 PO) Take 1 tablet by mouth daily.     doxazosin (CARDURA) 2 MG tablet TAKE 1 TABLET BY MOUTH EVERY DAY 90 tablet 1   esomeprazole (NEXIUM) 40 MG capsule TAKE 1 CAPSULE (40 MG TOTAL) BY MOUTH DAILY. 90 capsule 0   hydrochlorothiazide (HYDRODIURIL) 25 MG tablet TAKE 1 TABLET BY MOUTH EVERY DAY 90 tablet 3   latanoprost (XALATAN) 0.005 % ophthalmic solution Place 1 drop into both eyes at bedtime.   0   losartan (COZAAR) 100 MG tablet TAKE 1 TABLET BY MOUTH EVERY DAY 90 tablet 3   metoprolol succinate (TOPROL-XL) 25 MG 24 hr tablet TAKE 1 TABLET BY MOUTH EVERY DAY 90 tablet 3   Multiple Vitamin (MULTIVITAMIN) tablet Take 1 tablet by mouth daily.     ondansetron (ZOFRAN-ODT) 4 MG disintegrating tablet Take 1 tablet (4 mg total) by mouth every 8 (eight) hours as needed for nausea or vomiting. 8 tablet 0   rosuvastatin (CRESTOR) 40 MG tablet TAKE 1 TABLET BY MOUTH EVERY DAY 90 tablet 1   solifenacin (VESICARE) 5 MG tablet Take 5 mg by mouth daily.     timolol (TIMOPTIC) 0.5 % ophthalmic solution Place 1 drop into both eyes daily.     No current facility-administered medications for this visit.     Review of Systems  Please see the history of present illness.    (+) Left elbow and arm pain  All other systems reviewed and are otherwise negative except as noted above.  Physical Exam    Wt Readings from Last 3 Encounters:  05/16/22 166 lb 12.8 oz (75.7 kg)  04/28/22 167 lb (75.8 kg)   05/11/21 177 lb (80.3 kg)   AO:ZHYQM were no vitals filed for this visit.,There is no height or weight on file to calculate BMI.  Constitutional:      Appearance: Healthy appearance. Not in distress.  Neck:     Vascular: JVD normal.  Pulmonary:     Effort: Pulmonary effort is normal.     Breath sounds: No wheezing. No rales. Diminished in the bases Cardiovascular:     Normal rate. Regular rhythm. Normal S1. Normal S2.      Murmurs: There is no murmur.  Edema:    Peripheral edema absent.  Abdominal:     Palpations: Abdomen  is soft non tender. There is no hepatomegaly.  Skin:    General: Skin is warm and dry.  Neurological:     General: No focal deficit present.     Mental Status: Alert and oriented to person, place and time.     Cranial Nerves: Cranial nerves are intact.  EKG/LABS/ Recent Cardiac Studies    ECG personally reviewed by me today -sinus bradycardia with first-degree AVB and rate of 57 bpm with no acute changes or evidence of ACS.  Cardiac Studies & Procedures   CARDIAC CATHETERIZATION  CARDIAC CATHETERIZATION 02/02/2018  Narrative  There is severe left ventricular systolic dysfunction.  LV end diastolic pressure is moderately elevated.  The left ventricular ejection fraction is 25-35% by visual estimate.  There is trivial (1+) mitral regurgitation.  Dist LM lesion is 50% stenosed.  Prox LAD lesion is 95% stenosed.  Ost Cx to Prox Cx lesion is 90% stenosed.  Mid RCA lesion is 75% stenosed.  Ost RPDA to RPDA lesion is 90% stenosed.  1. Severe 3 vessel CAD with some distal left main disease as well 2. Severe LV dysfunction 3. Moderately elevated LVEDP  Plan: recommend CT surgery consult for CABG. Resume IV heparin. Start IV Ntg. Diuresis. Currently he is pain free.  Findings Coronary Findings Diagnostic  Dominance: Right  Left Main Dist LM lesion is 50% stenosed. The lesion is discrete.  Left Anterior Descending There is a dual LAD with a  large primarily septal branch and a large diagonal branch. Prox LAD lesion is 95% stenosed.  Left Circumflex Ost Cx to Prox Cx lesion is 90% stenosed.  Right Coronary Artery Mid RCA lesion is 75% stenosed.  Right Posterior Descending Artery Ost RPDA to RPDA lesion is 90% stenosed.  Intervention  No interventions have been documented.     ECHOCARDIOGRAM  ECHOCARDIOGRAM COMPLETE 06/13/2018  Narrative ECHOCARDIOGRAM REPORT    Patient Name:   MITSUGI LOWREY Date of Exam: 06/13/2018 Medical Rec #:  [id number]        Height:       66.5 in Accession #:    [id number]       Weight:       161.8 lb Date of Birth:  08/28/1940       BSA:          1.84 m Patient Age:    77 years         BP:           130/70 mmHg Patient Gender: M                HR:           65 bpm. Exam Location:  [address]   Procedure: 2D Echo, 3D Echo, Cardiac Doppler and Color Doppler  Indications:    Z95.1 CABG  History:        Patient has prior history of Echocardiogram examinations, most recent 02/05/2018. CHF CAD and Previous Myocardial Infarction Prior CABG Atrial Fibrillation Signs/Symptoms: Chest Pain Risk Factors: Family History of Coronary Artery Disease, Hypertension, Dyslipidemia and Former Smoker. Myocardial Infarction with CABG 02-05-2018.  Sonographer:    Farrel Conners RDCS Referring Phys: [id number] MARK C SKAINS  IMPRESSIONS   1. The left ventricle has normal systolic function with an ejection fraction of 60-65%. The cavity size was normal. There is mildly increased left ventricular wall thickness. Left ventricular diastolic Doppler parameters are consistent with impaired relaxation. Indeterminate filling pressures The E/e' is 8-15. No evidence of left ventricular  regional wall motion abnormalities. 2. The right ventricle has normal systolic function. The cavity was normal. There is no increase in right ventricular wall thickness. 3. Left atrial size was severely dilated. 4. The mitral  valve is abnormal. Mild thickening of the mitral valve leaflet. 5. The tricuspid valve is grossly normal. 6. The aortic valve is tricuspid. Mild sclerosis of the aortic valve. Aortic valve regurgitation is mild by color flow Doppler. No stenosis of the aortic valve. 7. The aortic root and ascending aorta are normal in size and structure. 8. The interatrial septum is aneurysmal.  SUMMARY  LVEF 60-65%, mild LVH, normal wall motion, grade 1 DD, indeterminate LV filling pressure, aortic sclerosis with mild AI, trivial MR, severe LAE, mild TR, normal RVSP, normal IVC, aneurysmal IAS without shunting by color doppler FINDINGS Left Ventricle: The left ventricle has normal systolic function, with an ejection fraction of 60-65%. The cavity size was normal. There is mildly increased left ventricular wall thickness. Left ventricular diastolic Doppler parameters are consistent with impaired relaxation. Indeterminate filling pressures The E/e' is 8-15. No evidence of left ventricular regional wall motion abnormalities..  Right Ventricle: The right ventricle has normal systolic function. The cavity was normal. There is no increase in right ventricular wall thickness.  Left Atrium: Left atrial size was severely dilated.  Right Atrium: Right atrial size was normal in size. Right atrial pressure is estimated at 3 mmHg.  Interatrial Septum: No atrial level shunt detected by color flow Doppler. An The interatrial septum is aneurysmal is seen.  Pericardium: There is no evidence of pericardial effusion.  Mitral Valve: The mitral valve is abnormal. Mild thickening of the mitral valve leaflet. Mitral valve regurgitation is trivial by color flow Doppler.  Tricuspid Valve: The tricuspid valve is grossly normal. Tricuspid valve regurgitation is mild by color flow Doppler.  Aortic Valve: The aortic valve is tricuspid Mild sclerosis of the aortic valve. Aortic valve regurgitation is mild by color flow Doppler.  There is No stenosis of the aortic valve.  Pulmonic Valve: The pulmonic valve was grossly normal. Pulmonic valve regurgitation is trivial by color flow Doppler.  Aorta: The aortic root and ascending aorta are normal in size and structure.  Venous: The inferior vena cava measures 3.00 cm, is normal in size with greater than 50% respiratory variability.   +--------------+--------++ LEFT VENTRICLE         +----------------+----------++ +--------------+--------++ Diastology                 PLAX 2D                +----------------+----------++ +--------------+--------++ LV e' lateral:  10.60 cm/s LVIDd:        3.63 cm  +----------------+----------++ +--------------+--------++ LV E/e' lateral:9.8        LVIDs:        2.24 cm  +----------------+----------++ +--------------+--------++ LV e' medial:   5.11 cm/s  LV PW:        1.06 cm  +----------------+----------++ +--------------+--------++ LV E/e' medial: 20.4       LV IVS:       1.09 cm  +----------------+----------++ +--------------+--------++ LVOT diam:    2.00 cm  +--------------+--------++ LV SV:        39 ml    +--------------+--------++ LV SV Index:  20.65    +--------------+--------++ LVOT Area:    3.14 cm +--------------+--------++                        +--------------+--------++  +---------------+----------++  RIGHT VENTRICLE           +---------------+----------++ RV S prime:    11.20 cm/s +---------------+----------++ TAPSE (M-mode):2.0 cm     +---------------+----------++  +---------------+-------++-----------++ LEFT ATRIUM           Index       +---------------+-------++-----------++ LA diam:       4.90 cm2.67 cm/m  +---------------+-------++-----------++ LA Vol (A2C):  99.7 ml54.25 ml/m +---------------+-------++-----------++ LA Vol (A4C):  88.0 ml47.88 ml/m +---------------+-------++-----------++ LA  Biplane Vol:98.1 ml53.38 ml/m +---------------+-------++-----------++ +------------+---------++-----------++ RIGHT ATRIUM         Index       +------------+---------++-----------++ RA Area:    17.10 cm            +------------+---------++-----------++ RA Volume:  44.60 ml 24.27 ml/m +------------+---------++-----------++ +------------+-----------++ AORTIC VALVE            +------------+-----------++ LVOT Vmax:  123.00 cm/s +------------+-----------++ LVOT Vmean: 84.700 cm/s +------------+-----------++ LVOT VTI:   0.282 m     +------------+-----------++ AR PHT:     385 msec    +------------+-----------++  +-------------+-------++ AORTA                +-------------+-------++ Ao Root diam:3.90 cm +-------------+-------++ Ao Asc diam: 3.70 cm +-------------+-------++  +--------------+--------++    +---------------+-----------++ MITRAL VALVE              TRICUSPID VALVE            +--------------+--------++    +---------------+-----------++ MV Area (PHT):cm         TR Peak grad:  31.5 mmHg   +--------------+--------++    +---------------+-----------++ MV PHT:       msec        TR Vmax:       312.00 cm/s +--------------+--------++    +---------------+-----------++ MV Decel Time:292 msec +--------------+--------++    +--------------+-------+ +--------------+-----------++ SHUNTS                MV E velocity:104.00 cm/s +--------------+-------+ +--------------+-----------++ Systemic VTI: 0.28 m  MV A velocity:107.50 cm/s +--------------+-------+ +--------------+-----------++ Systemic Diam:2.00 cm MV E/A ratio: 0.97        +--------------+-------+ +--------------+-----------++  +---------+-------+ IVC              +---------+-------+ IVC diam:3.00 cm +---------+-------+   Zoila Shutter MD Electronically signed by Zoila Shutter MD Signature Date/Time:  06/13/2018/1:21:40 PM    Final   TEE  ECHO TEE 02/05/2018  Interpretation Summary  Septum: Small interatrial septal defect present with left to right shunting.  Left atrium: Small interatrial septal defect present with left to right shunting.  Aortic valve: The valve is trileaflet. Mild valve thickening present. No stenosis. Mild to moderate regurgitation.  Aorta: The aortic root is mildly dilated at the sinus of Valsalva.  Mitral valve: Mild leaflet thickening is present.  Right ventricle: Normal cavity size, wall thickness and ejection fraction.            Risk Assessment/Calculations:           Lab Results  Component Value Date   WBC 5.9 05/10/2022   HGB 15.5 05/10/2022   HCT 46.9 05/10/2022   MCV 98.3 05/10/2022   PLT 200 05/10/2022   Lab Results  Component Value Date   CREATININE 1.12 05/10/2022   BUN 27 (H) 05/10/2022   NA 140 05/10/2022   K 5.2 05/10/2022   CL 102 05/10/2022   CO2 27 05/10/2022   Lab Results  Component Value Date   ALT 22 05/10/2022   AST  22 05/10/2022   ALKPHOS 87 06/20/2018   BILITOT 0.7 05/10/2022   Lab Results  Component Value Date   CHOL 151 05/10/2022   HDL 55 05/10/2022   LDLCALC 77 05/10/2022   TRIG 104 05/10/2022   CHOLHDL 2.7 05/10/2022    Lab Results  Component Value Date   HGBA1C 5.5 02/02/2018     Assessment & Plan    1.  Left arm pain: -Patient reports sensation of pain in his left arm from elbow to hand with decreased functioning and weakness when grasping or gripping. -Today patient reports that his discomfort has resolved since yesterday. -He has resumed playing pickle ball after 2 months since hammertoe repair  -I advised him to use heat and ice on his elbow and try Voltaren gel.  I also advised him to follow-up with his PCP for further treatment recommendations.  2.  Coronary artery disease: -s/p NSTEMI with CABG x 4 in 2020 -Patient reports no chest pain or anginal equivalent similar to his  NSTEMI in 2020. -Continue GDMT with ASA 81 mg, Toprol 25 mg daily, Crestor 40 mg daily  3.  Essential hypertension: -Patient's blood pressure today was 132/64 -Continue Toprol-XL 25 mg daily and losartan 100 mg daily, and HCTZ 25 mg daily  4.  Dyslipidemia: -Patient's last LDL cholesterol was 77 -Continue Crestor 40 mg daily   Disposition: Follow-up with Donato Schultz, MD as scheduled    Medication Adjustments/Labs and Tests Ordered: Current medicines are reviewed at length with the patient today.  Concerns regarding medicines are outlined above.   Signed, Napoleon Form, Leodis Rains, NP 05/26/2022, 7:13 AM [facility]

## 2022-05-26 NOTE — Telephone Encounter (Signed)
Pt has been seen by Robin Searing today.  Please see that office visit note for further information.

## 2022-05-26 NOTE — Patient Instructions (Signed)
Medication Instructions:  Your physician recommends that you continue on your current medications as directed. Please refer to the Current Medication list given to you today. *If you need a refill on your cardiac medications before your next appointment, please call your pharmacy*   Lab Work: None ordered   Testing/Procedures: None ordered   Follow-Up: At [facility], you and your health needs are our priority.  As part of our continuing mission to provide you with exceptional heart care, we have created designated Provider Care Teams.  These Care Teams include your primary Cardiologist (physician) and Advanced Practice Providers (APPs -  Physician Assistants and Nurse Practitioners) who all work together to provide you with the care you need, when you need it.  We recommend signing up for the patient portal called "MyChart".  Sign up information is provided on this After Visit Summary.  MyChart is used to connect with patients for Virtual Visits (Telemedicine).  Patients are able to view lab/test results, encounter notes, upcoming appointments, etc.  Non-urgent messages can be sent to your provider as well.   To learn more about what you can do with MyChart, go to [email or web address].    Your next appointment:   12 month(s)  Provider:   Donato Schultz, MD     Other Instructions USE HEAT AND ICE ON YOUR ELBOW AND IF THE PAIN GETS WORSE CONTACT YOUR PRIMARY CARE PROVIDER

## 2022-05-31 NOTE — Telephone Encounter (Signed)
Pt's Basic medical form completed per pcp, given to front staff to fax/mail out and also to notify pt as well

## 2022-06-01 DIAGNOSIS — L820 Inflamed seborrheic keratosis: Secondary | ICD-10-CM | POA: Diagnosis not present

## 2022-06-01 DIAGNOSIS — X32XXXD Exposure to sunlight, subsequent encounter: Secondary | ICD-10-CM | POA: Diagnosis not present

## 2022-06-01 DIAGNOSIS — L570 Actinic keratosis: Secondary | ICD-10-CM | POA: Diagnosis not present

## 2022-06-01 NOTE — Telephone Encounter (Signed)
Outbound call placed to notify patient paperwork completed. No answer; Left voicemail message to notify patient and to find out if he wants the forms faxed. Requested return call.

## 2022-06-06 ENCOUNTER — Encounter: Payer: Self-pay | Admitting: Gastroenterology

## 2022-06-07 ENCOUNTER — Encounter: Payer: Self-pay | Admitting: Gastroenterology

## 2022-06-22 DIAGNOSIS — I2510 Atherosclerotic heart disease of native coronary artery without angina pectoris: Secondary | ICD-10-CM | POA: Diagnosis not present

## 2022-06-22 DIAGNOSIS — I252 Old myocardial infarction: Secondary | ICD-10-CM | POA: Diagnosis not present

## 2022-06-22 DIAGNOSIS — E785 Hyperlipidemia, unspecified: Secondary | ICD-10-CM | POA: Diagnosis not present

## 2022-06-22 DIAGNOSIS — I129 Hypertensive chronic kidney disease with stage 1 through stage 4 chronic kidney disease, or unspecified chronic kidney disease: Secondary | ICD-10-CM | POA: Diagnosis not present

## 2022-06-22 DIAGNOSIS — N3941 Urge incontinence: Secondary | ICD-10-CM | POA: Diagnosis not present

## 2022-06-22 DIAGNOSIS — K219 Gastro-esophageal reflux disease without esophagitis: Secondary | ICD-10-CM | POA: Diagnosis not present

## 2022-06-22 DIAGNOSIS — N529 Male erectile dysfunction, unspecified: Secondary | ICD-10-CM | POA: Diagnosis not present

## 2022-06-22 DIAGNOSIS — K5900 Constipation, unspecified: Secondary | ICD-10-CM | POA: Diagnosis not present

## 2022-06-22 DIAGNOSIS — M1990 Unspecified osteoarthritis, unspecified site: Secondary | ICD-10-CM | POA: Diagnosis not present

## 2022-07-12 DIAGNOSIS — H43813 Vitreous degeneration, bilateral: Secondary | ICD-10-CM | POA: Diagnosis not present

## 2022-07-12 DIAGNOSIS — H401131 Primary open-angle glaucoma, bilateral, mild stage: Secondary | ICD-10-CM | POA: Diagnosis not present

## 2022-07-12 DIAGNOSIS — H04123 Dry eye syndrome of bilateral lacrimal glands: Secondary | ICD-10-CM | POA: Diagnosis not present

## 2022-07-12 DIAGNOSIS — H2513 Age-related nuclear cataract, bilateral: Secondary | ICD-10-CM | POA: Diagnosis not present

## 2022-08-03 ENCOUNTER — Other Ambulatory Visit: Payer: Self-pay | Admitting: Family Medicine

## 2022-08-03 NOTE — Telephone Encounter (Signed)
Last OV 05/16/22, within protocol.  Requested Prescriptions  Pending Prescriptions Disp Refills   esomeprazole (NEXIUM) 40 MG capsule [Pharmacy Med Name: ESOMEPRAZOLE MAG DR 40 MG CAP] 90 capsule 0    Sig: TAKE 1 CAPSULE (40 MG TOTAL) BY MOUTH DAILY.     Gastroenterology: Proton Pump Inhibitors 2 Failed - 08/03/2022  2:25 AM      Failed - Valid encounter within last 12 months    Recent Outpatient Visits           1 year ago General medical exam   [facility] Donita Brooks, MD   1 year ago Left sided sciatica   [facility] Donita Brooks, MD   2 years ago Blood pressure check   [facility] Tanya Nones, Priscille Heidelberg, MD   2 years ago Prostate cancer screening   [facility] Donita Brooks, MD   3 years ago Left sided sciatica   [facility] Pickard, Priscille Heidelberg, MD       Future Appointments             In 9 months Pickard, Priscille Heidelberg, MD [facility], [facility]            Passed - ALT in normal range and within 360 days    ALT  Date Value Ref Range Status  05/10/2022 22 9 - 46 U/L Final         Passed - AST in normal range and within 360 days    AST  Date Value Ref Range Status  05/10/2022 22 10 - 35 U/L Final

## 2022-08-18 IMAGING — CT CT CERVICAL SPINE W/O CM
2 of 4 series · 9 of 33 positions shown, 11 images · non-contrast
Comparison: None.

CLINICAL DATA: Facial trauma, blunt; Neck trauma, midline
tenderness (Age 16-64y). Fall, epistaxis, scalp swelling



[Series 6: orthogonal axials · axial · 0.23mm/px · z∈[-333,-170]mm · 6 of 124 slices shown, 8 images]
[im 18/124  soft-tissue]
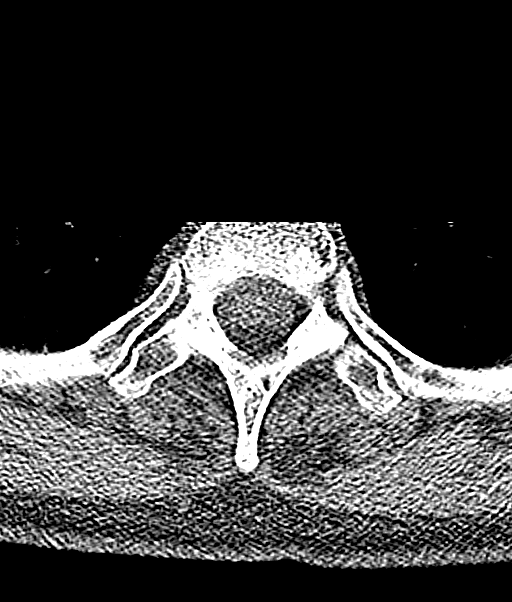
[im 18/124  bone]
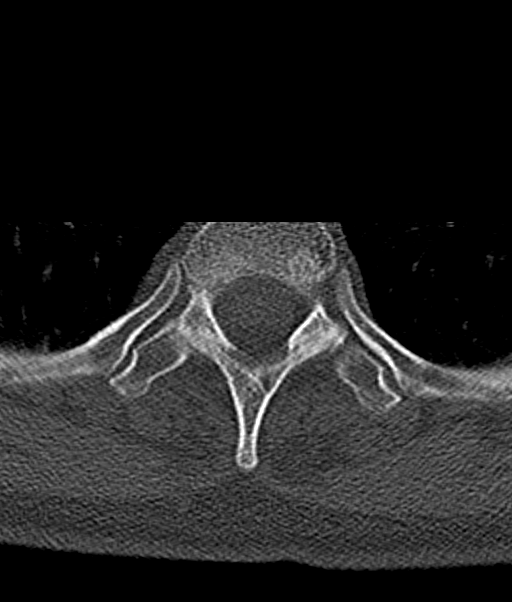
[im 36/124  bone]
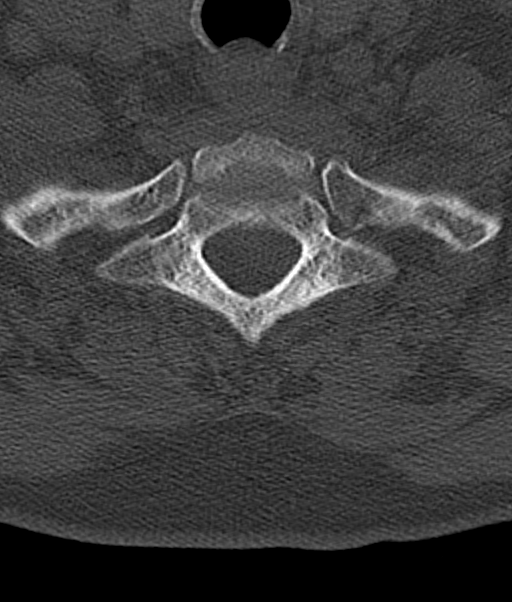
[im 53/124  bone]
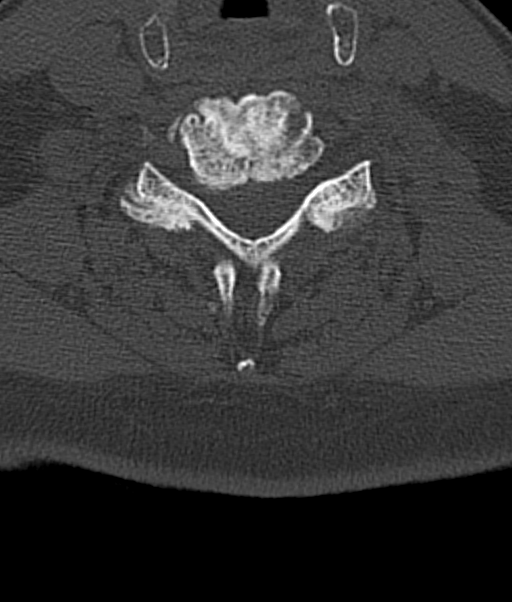
[im 71/124  bone]
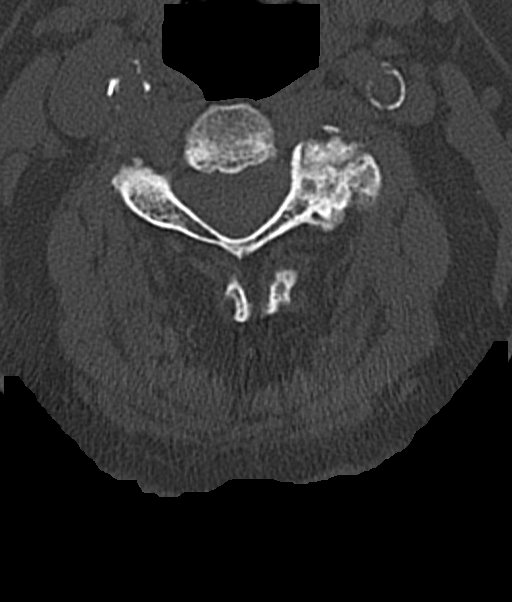
[im 88/124  soft-tissue]
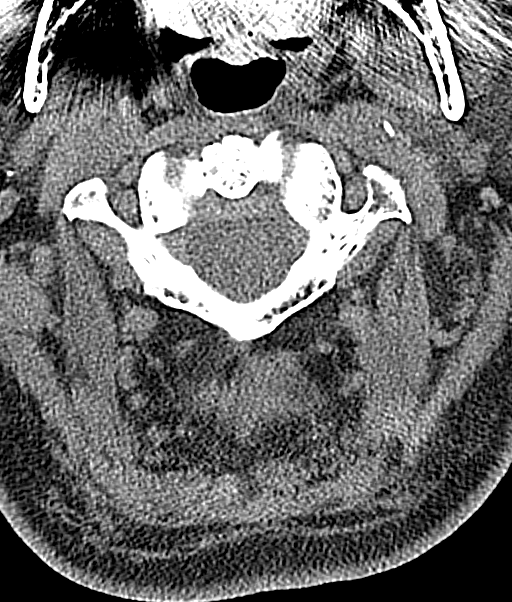
[im 88/124  bone]
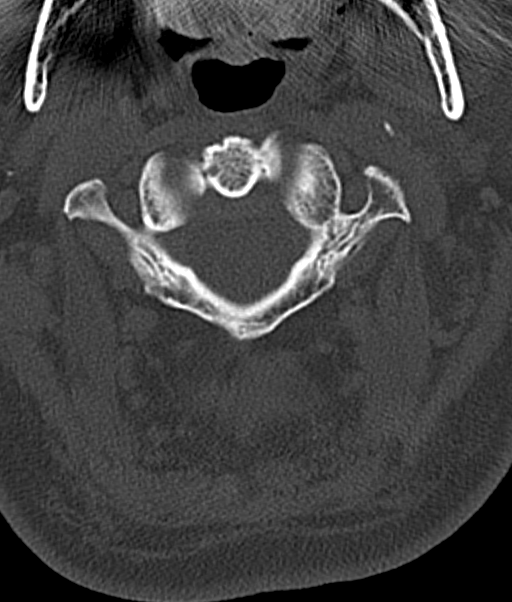
[im 106/124  bone]
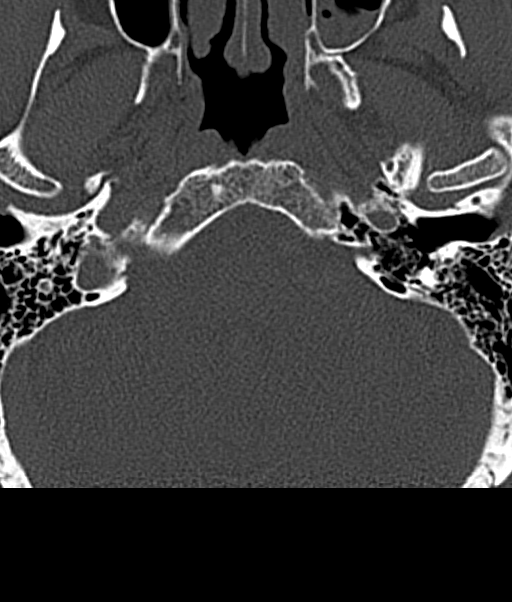

[Series 7: coronal bone · coronal · 0.23mm/px · 3 of 71 slices shown]
[im 15/71  bone]
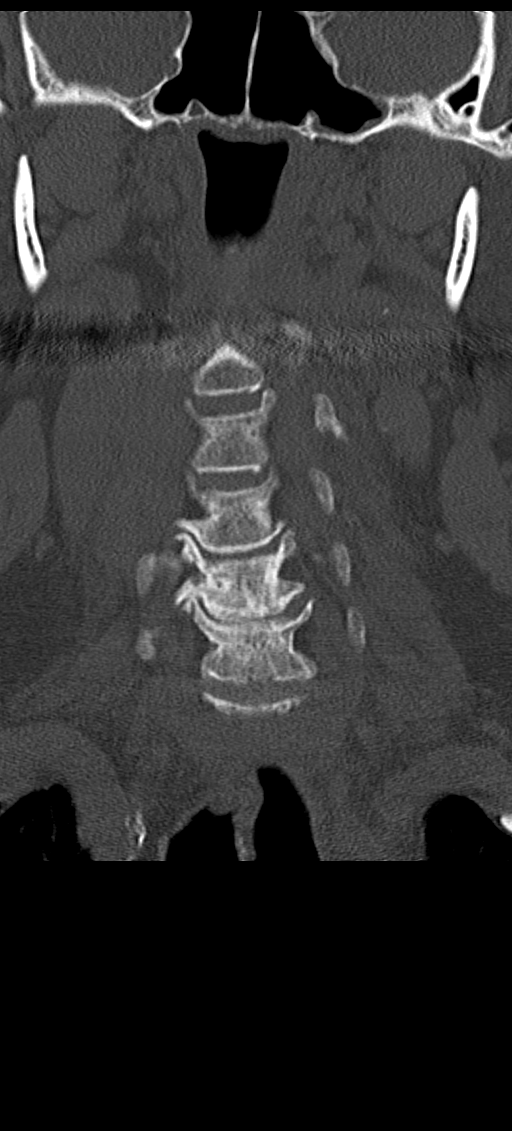
[im 29/71  bone]
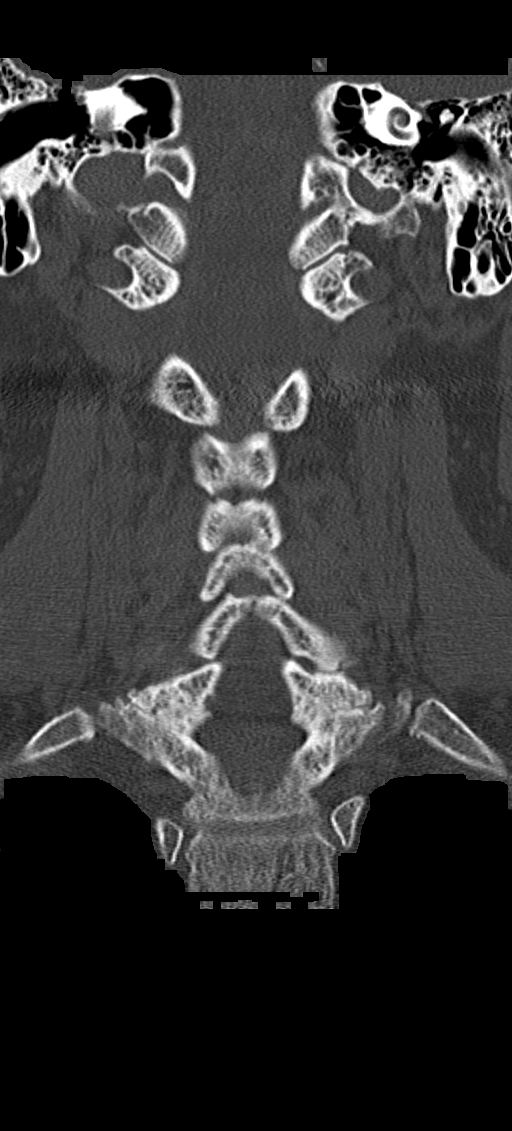
[im 42/71  bone]
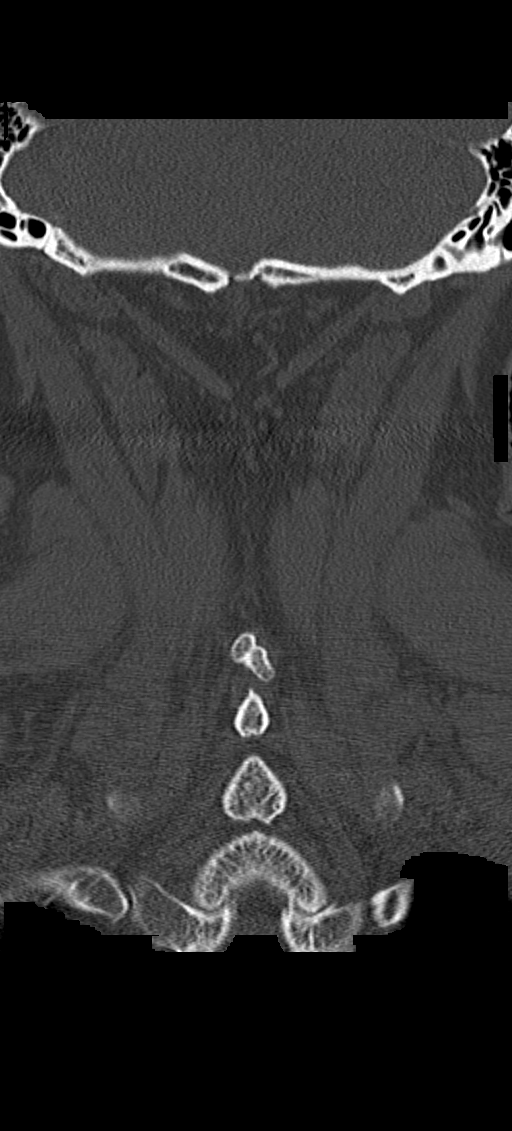

[9 of 33 positions shown; findings below may reference images not displayed]

FINDINGS: CT HEAD FINDINGS

Brain: Parenchymal volume loss is commensurate with the patient's
age. Mild periventricular white matter changes are present likely
reflecting the sequela of small vessel ischemia. No acute
intracranial hemorrhage or infarct. No abnormal mass effect or
midline shift. No abnormal intra or extra-axial mass lesion or fluid
collection. Ventricular size is normal. Cerebellum is unremarkable.

Vascular: No hyperdense vessel or unexpected calcification.

Skull: Intact. In particular the floor of the left anterior cranial
fossa appears intact.

Other: Mastoid air cells and middle ear cavities are clear. Large
frontal scalp hematoma noted.

CT MAXILLOFACIAL FINDINGS

Osseous: There are minimally displaced fractures of the medial and
moderately displaced fractures of the superior orbital walls of the
left orbit. The depressed fracture fragment of the supraorbital
ridge abuts the left ocular globe superiorly, best seen on axial
image # [DATE] and coronal image # [DATE], and may impinge upon the
superior rectus. Punctate foci of gas are seen within the
extraconal retro-orbital left orbit secondary to communication with
the left frontal sinus.

Orbits: Extensive left preseptal soft tissue swelling. As noted
above, depressed supraorbital ridge fracture fragment abuts the
ocular globe superiorly and may impinge upon the superior rectus.

Sinuses: There is fluid opacification within the left frontal sinus
and left ethmoid air cells likely representing blood in the setting
of acute trauma. Minimal layering mucus or blood within the left
maxillary sinus. Remaining paranasal sinuses are clear.

Soft tissues: There is extensive left preseptal soft tissue
swelling.

CT CERVICAL SPINE FINDINGS

Alignment: 2 mm anterolisthesis C3-4 and C7-T1 likely degenerative
in nature. Otherwise normal cervical lordosis.

Skull base and vertebrae: Craniocervical alignment is normal.
Atlantodental interval is not widened. No acute fracture of the
cervical spine. Vertebral body height is preserved.

Soft tissues and spinal canal: No prevertebral fluid or swelling. No
visible canal hematoma.

Disc levels: There is intervertebral disc space narrowing and
endplate remodeling of C4-C7 in keeping with changes of moderate to
severe degenerative disc disease, most severe at C5-6. Prevertebral
soft tissues are not thickened on sagittal reformats. Spinal canal
is widely patent. Review of the axial images demonstrates multilevel
uncovertebral and facet arthrosis resulting in multilevel
neuroforaminal narrowing, moderate bilaterally at C4-5 and C5-6 and
on the left at C3-4.

Upper chest: Unremarkable

Other: None
IMPRESSION: No acute intracranial injury. No calvarial fracture. Large frontal
scalp hematoma.

Fractures of the superior and medial orbital walls. Depressed
fracture fragment of the supraorbital ridge abuts the superior
margin of the a ocular globe and may impinge upon the superior
rectus. Extensive left preseptal soft tissue swelling.

Fluid opacification of the left frontal and ethmoid sinuses likely
representing blood in the setting of acute trauma. Mild layering
probable blood product within the left maxillary sinus.

No acute fracture or listhesis of the cervical spine.

## 2022-08-18 IMAGING — CT CT HEAD W/O CM
2 of 4 series · 11 of 47 positions shown, 13 images · non-contrast
Comparison: None.

CLINICAL DATA: Facial trauma, blunt; Neck trauma, midline
tenderness (Age 16-64y). Fall, epistaxis, scalp swelling



[Series 6: coronal soft tissue · coronal · 0.34mm/px · 3 of 74 slices shown]
[im 25/74  brain]
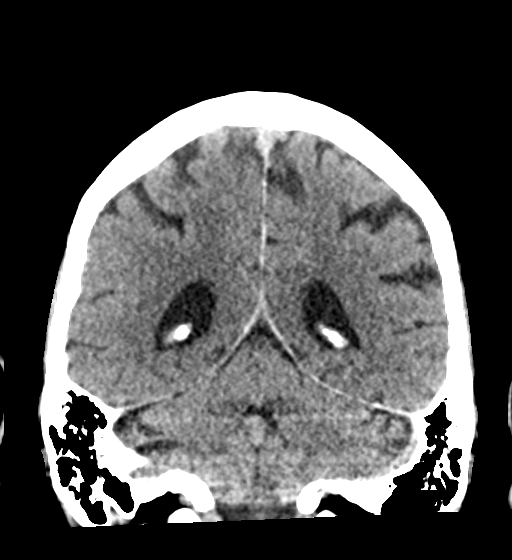
[im 33/74  brain]
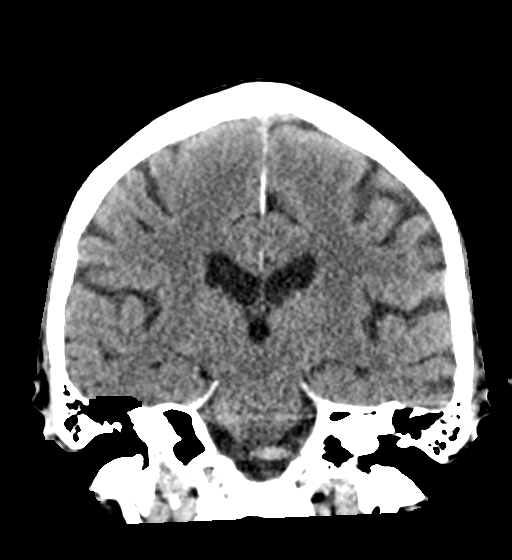
[im 41/74  brain]
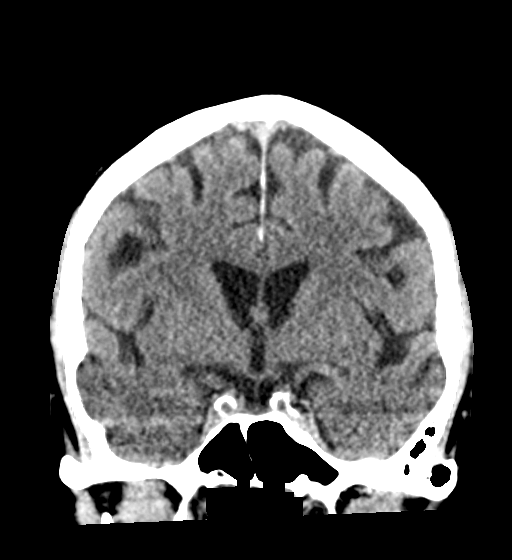

[Series 8: true axial · axial · 0.34mm/px · z∈[-135,-7]mm · 8 of 57 slices shown, 10 images]
[im 7/57  brain]
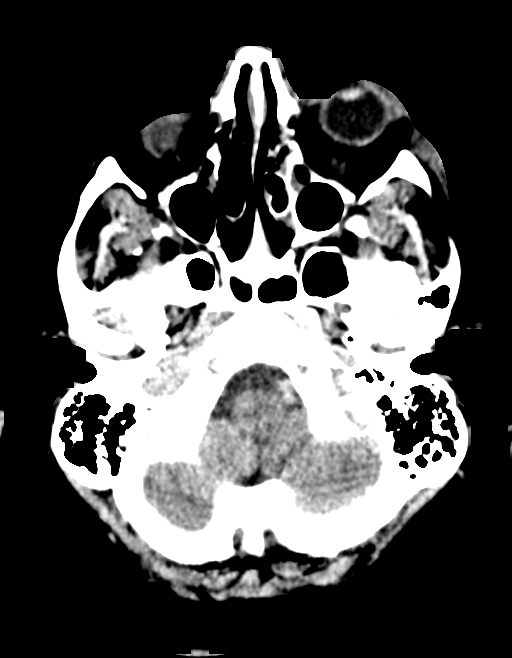
[im 7/57  bone]
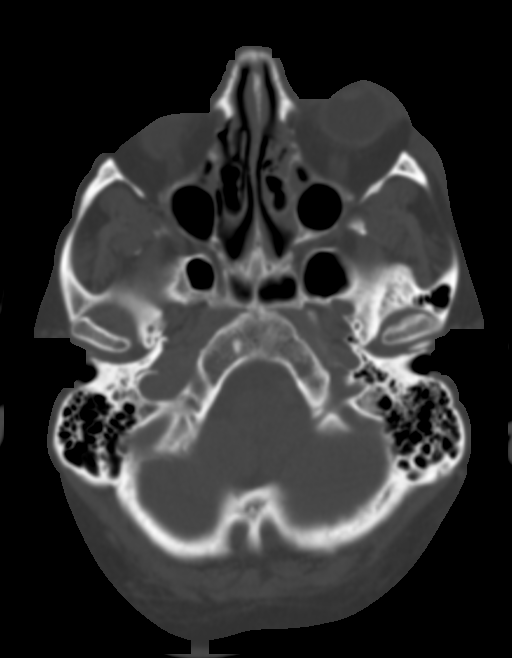
[im 13/57  brain]
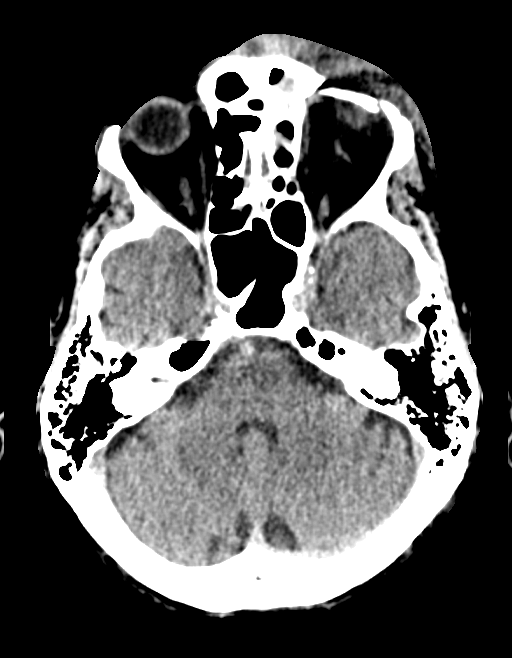
[im 19/57  brain]
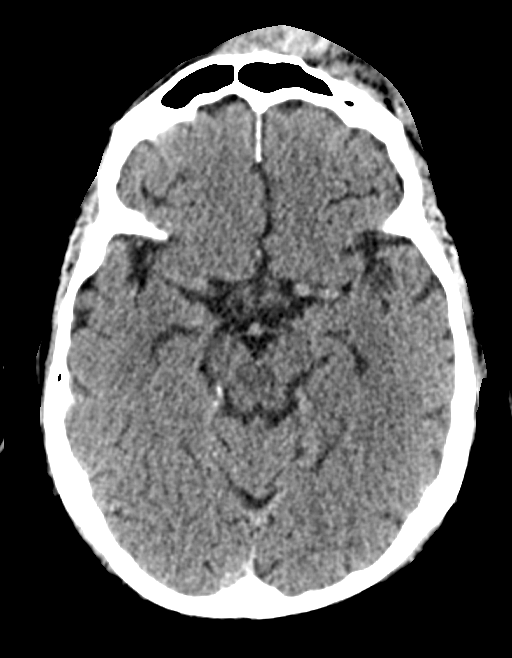
[im 25/57  brain]
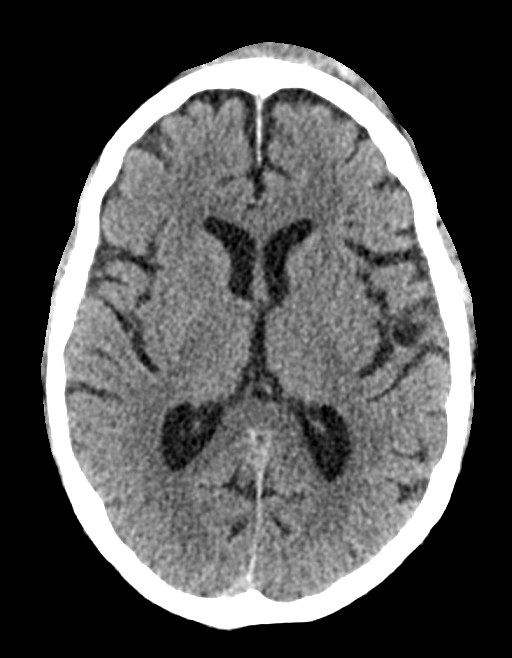
[im 32/57  brain]
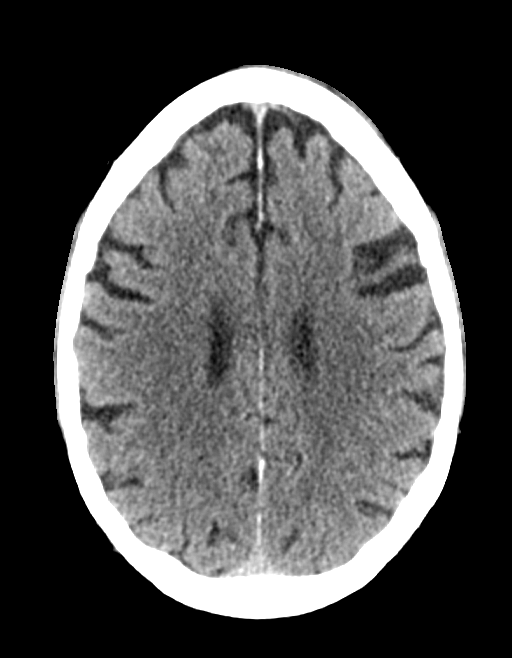
[im 32/57  bone]
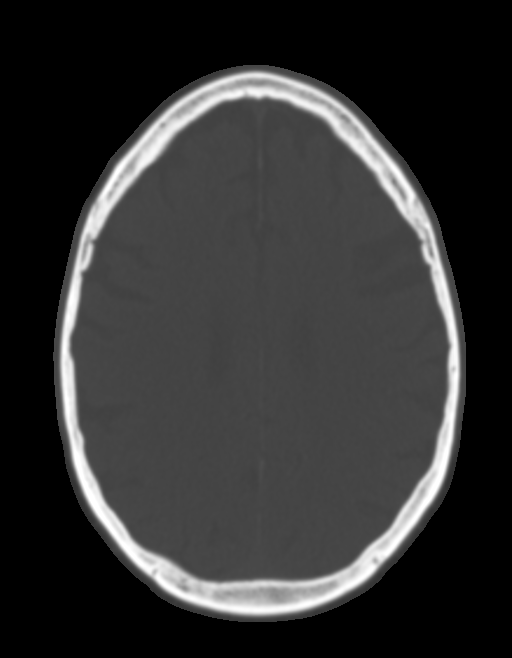
[im 38/57  brain]
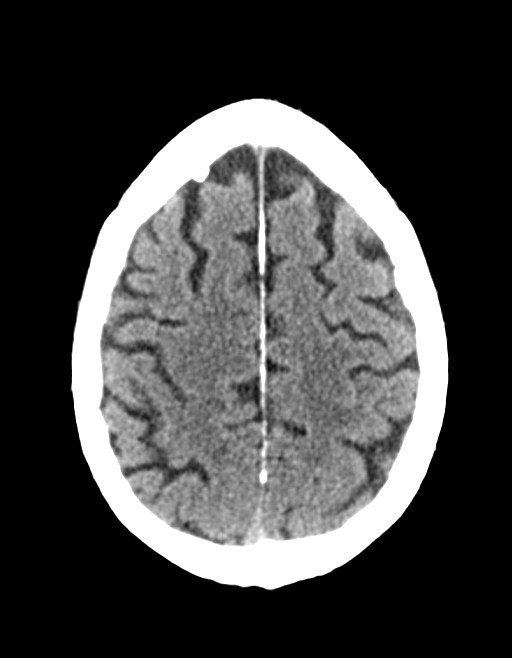
[im 44/57  brain]
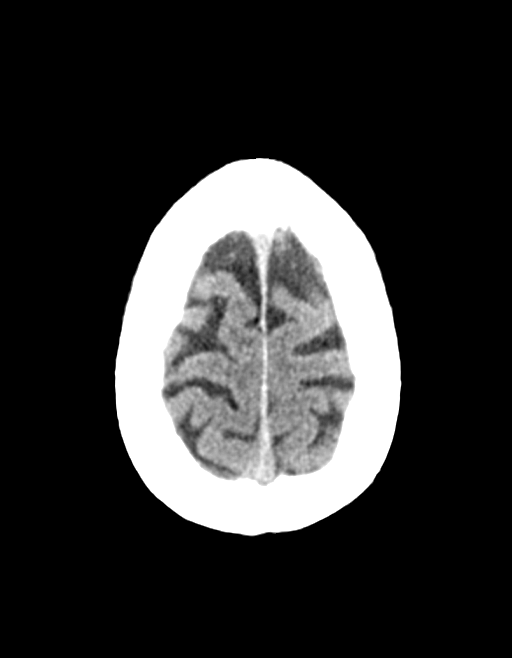
[im 50/57  brain]
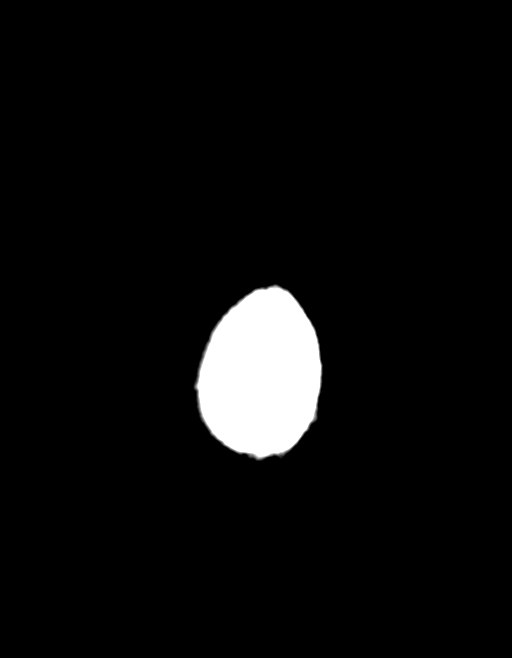

[11 of 47 positions shown; findings below may reference images not displayed]

FINDINGS: CT HEAD FINDINGS

Brain: Parenchymal volume loss is commensurate with the patient's
age. Mild periventricular white matter changes are present likely
reflecting the sequela of small vessel ischemia. No acute
intracranial hemorrhage or infarct. No abnormal mass effect or
midline shift. No abnormal intra or extra-axial mass lesion or fluid
collection. Ventricular size is normal. Cerebellum is unremarkable.

Vascular: No hyperdense vessel or unexpected calcification.

Skull: Intact. In particular the floor of the left anterior cranial
fossa appears intact.

Other: Mastoid air cells and middle ear cavities are clear. Large
frontal scalp hematoma noted.

CT MAXILLOFACIAL FINDINGS

Osseous: There are minimally displaced fractures of the medial and
moderately displaced fractures of the superior orbital walls of the
left orbit. The depressed fracture fragment of the supraorbital
ridge abuts the left ocular globe superiorly, best seen on axial
image # [DATE] and coronal image # [DATE], and may impinge upon the
superior rectus. Punctate foci of gas are seen within the
extraconal retro-orbital left orbit secondary to communication with
the left frontal sinus.

Orbits: Extensive left preseptal soft tissue swelling. As noted
above, depressed supraorbital ridge fracture fragment abuts the
ocular globe superiorly and may impinge upon the superior rectus.

Sinuses: There is fluid opacification within the left frontal sinus
and left ethmoid air cells likely representing blood in the setting
of acute trauma. Minimal layering mucus or blood within the left
maxillary sinus. Remaining paranasal sinuses are clear.

Soft tissues: There is extensive left preseptal soft tissue
swelling.

CT CERVICAL SPINE FINDINGS

Alignment: 2 mm anterolisthesis C3-4 and C7-T1 likely degenerative
in nature. Otherwise normal cervical lordosis.

Skull base and vertebrae: Craniocervical alignment is normal.
Atlantodental interval is not widened. No acute fracture of the
cervical spine. Vertebral body height is preserved.

Soft tissues and spinal canal: No prevertebral fluid or swelling. No
visible canal hematoma.

Disc levels: There is intervertebral disc space narrowing and
endplate remodeling of C4-C7 in keeping with changes of moderate to
severe degenerative disc disease, most severe at C5-6. Prevertebral
soft tissues are not thickened on sagittal reformats. Spinal canal
is widely patent. Review of the axial images demonstrates multilevel
uncovertebral and facet arthrosis resulting in multilevel
neuroforaminal narrowing, moderate bilaterally at C4-5 and C5-6 and
on the left at C3-4.

Upper chest: Unremarkable

Other: None
IMPRESSION: No acute intracranial injury. No calvarial fracture. Large frontal
scalp hematoma.

Fractures of the superior and medial orbital walls. Depressed
fracture fragment of the supraorbital ridge abuts the superior
margin of the a ocular globe and may impinge upon the superior
rectus. Extensive left preseptal soft tissue swelling.

Fluid opacification of the left frontal and ethmoid sinuses likely
representing blood in the setting of acute trauma. Mild layering
probable blood product within the left maxillary sinus.

No acute fracture or listhesis of the cervical spine.

## 2022-08-26 ENCOUNTER — Other Ambulatory Visit: Payer: Self-pay | Admitting: Cardiology

## 2022-10-11 DIAGNOSIS — H401131 Primary open-angle glaucoma, bilateral, mild stage: Secondary | ICD-10-CM | POA: Diagnosis not present

## 2022-10-11 DIAGNOSIS — H35032 Hypertensive retinopathy, left eye: Secondary | ICD-10-CM | POA: Diagnosis not present

## 2022-10-11 DIAGNOSIS — H04123 Dry eye syndrome of bilateral lacrimal glands: Secondary | ICD-10-CM | POA: Diagnosis not present

## 2022-10-11 DIAGNOSIS — H2513 Age-related nuclear cataract, bilateral: Secondary | ICD-10-CM | POA: Diagnosis not present

## 2022-10-13 ENCOUNTER — Ambulatory Visit: Payer: Medicare PPO

## 2022-10-13 VITALS — BP 130/64 | Ht 67.0 in | Wt 168.0 lb

## 2022-10-13 DIAGNOSIS — Z0000 Encounter for general adult medical examination without abnormal findings: Secondary | ICD-10-CM

## 2022-10-13 NOTE — Patient Instructions (Addendum)
Mr. Alex Martin , Thank you for taking time to come for your Medicare Wellness Visit. I appreciate your ongoing commitment to your health goals. Please review the following plan we discussed and let me know if I can assist you in the future.   Referrals/Orders/Follow-Ups/Clinician Recommendations: Aim for 30 minutes of exercise or brisk walking, 6-8 glasses of water, and 5 servings of fruits and vegetables each day.  This is a list of the screening recommended for you and due dates:  Health Maintenance  Topic Date Due   Zoster (Shingles) Vaccine (2 of 2) 01/11/2018   DTaP/Tdap/Td vaccine (2 - Td or Tdap) 06/18/2022   COVID-19 Vaccine (7 - 2023-24 season) 11/09/2022   Medicare Annual Wellness Visit  10/13/2023   Pneumonia Vaccine  Completed   Flu Shot  Completed   HPV Vaccine  Aged Out   Hepatitis C Screening  Discontinued    Advanced directives: (ACP Link)Information on Advanced Care Planning can be found at [facility] Advance Health Care Directives Advance Health Care Directives ([email or web address]   Next Medicare Annual Wellness Visit scheduled for next year: Yes

## 2022-10-13 NOTE — Progress Notes (Signed)
Subjective:   Alex Martin is a 82 y.o. male who presents for Medicare Annual/Subsequent preventive examination.  Visit Complete: In person  Cardiac Risk Factors include: advanced age (>3men, >18 women);hypertension;male gender;dyslipidemia     Objective:    Today's Vitals   10/13/22 1410  BP: 130/64  Weight: 168 lb (76.2 kg)  Height: 5\' 7"  (1.702 m)   Body mass index is 26.31 kg/m.     10/13/2022    2:46 PM 04/04/2021   11:15 PM 04/30/2020   10:43 AM 02/07/2018    2:00 PM 11/14/2017    9:31 AM 11/14/2017    9:28 AM 02/10/2011   10:41 AM  Advanced Directives  Does Patient Have a Medical Advance Directive? No No Yes Yes Yes Yes Patient has advance directive, copy not in chart  Type of Advance Directive   Healthcare Power of [place];Living will Healthcare Power of [place];Living will Healthcare Power of [employer] Power of [employer] Power of [place];Living will  Does patient want to make changes to medical advance directive?   No - Patient declined No - Patient declined     Copy of Healthcare Power of Attorney in Chart?   No - copy requested No - copy requested No - copy requested No - copy requested --  Would patient like information on creating a medical advance directive? Yes (MAU/Ambulatory/Procedural Areas - Information given) No - Patient declined         Current Medications (verified) Outpatient Encounter Medications as of 10/13/2022  Medication Sig   aspirin EC 81 MG tablet Take 81 mg by mouth daily.   Cyanocobalamin (VITAMIN B-12 PO) Take 1 tablet by mouth daily.   doxazosin (CARDURA) 2 MG tablet TAKE 1 TABLET BY MOUTH EVERY DAY   esomeprazole (NEXIUM) 40 MG capsule TAKE 1 CAPSULE (40 MG TOTAL) BY MOUTH DAILY.   hydrochlorothiazide (HYDRODIURIL) 25 MG tablet TAKE 1 TABLET BY MOUTH EVERY DAY   latanoprost (XALATAN) 0.005 % ophthalmic solution Place 1 drop into both eyes at bedtime.    losartan (COZAAR) 100 MG tablet TAKE 1 TABLET BY MOUTH  EVERY DAY   metoprolol succinate (TOPROL-XL) 25 MG 24 hr tablet TAKE 1 TABLET BY MOUTH EVERY DAY   Multiple Vitamin (MULTIVITAMIN) tablet Take 1 tablet by mouth daily.   rosuvastatin (CRESTOR) 40 MG tablet Take 1 tablet (40 mg total) by mouth daily.   solifenacin (VESICARE) 5 MG tablet Take 5 mg by mouth daily.   timolol (TIMOPTIC) 0.5 % ophthalmic solution Place 1 drop into both eyes 2 (two) times daily.   No facility-administered encounter medications on file as of 10/13/2022.    Allergies (verified) Amlodipine and Shellfish allergy   History: Past Medical History:  Diagnosis Date   Abdominal distension    UMBILICAL HERNIA-NO PAIN   Aortic insufficiency 02/23/2021   Echocardiogram 06/13/2018:  EF 60-65, mild LVH, Gr 1 DD, no RWMA, normla RVSF, severe LAE, mild AI   Bleeding from the nose    SEVERAL EPISODES THAT REQUIRED TX IN ER--LAST EPISODE WAS APPROX 1 YR AGO   Blood in urine    MICROSCOPIC BLOODEVALUATED BY DR. NESI FRIDAY 02/11/11--URINE SAMPLE AND EXAM-- AND BLOOD SAMPLE AND TO BE SEEN AGAIN IN 6 MONTHS AND GIVEN VESICARE  FOR FREQUENCY AND URGENCY   CAD (coronary artery disease) 02/22/2021   NSTEMI in 01/2018 s/p CABG Post CABG AFib   Carotid artery disease (HCC) 02/22/2021   Pre CABG Dopplers 02/02/2018:  Bilat ICA 1-39   Coronary  artery disease    GERD (gastroesophageal reflux disease)    Hyperlipidemia    Hypertension    Ischemic cardiomyopathy 02/22/2021   EF ~ 30 >> Improved to normal  Echocardiogram 06/13/2018:  EF 60-65, mild LVH, Gr 1 DD, no RWMA, normla RVSF, severe LAE, mild AI Echocardiogram 02/02/2018:  Mod LVH, EF 30-35, WMA c/w LAD infarct, normal RVSF, mild pulmonary hypertension    Past Surgical History:  Procedure Laterality Date   CARDIAC CATHETERIZATION     CORONARY ARTERY BYPASS GRAFT N/A 02/05/2018   Procedure: CORONARY ARTERY BYPASS GRAFTING (CABG) x4 USING BILATERAL IMA'S WITH LEFT GREATER SAPHENOUS VEIN HARVEST AND EXPLORATION OF RIGHT SAPHENOUS;  Surgeon:  Alleen Borne, MD;  Location: [facility] OR;  Service: Open Heart Surgery;  Laterality: N/A;   HERNIA REPAIR  1959   [facility]   LEFT HEART CATH AND CORONARY ANGIOGRAPHY N/A 02/02/2018   Procedure: LEFT HEART CATH AND CORONARY ANGIOGRAPHY;  Surgeon: [place], Peter M, MD;  Location: [facility] INVASIVE CV LAB;  Service: Cardiovascular;  Laterality: N/A;   TEE WITHOUT CARDIOVERSION N/A 02/05/2018   Procedure: TRANSESOPHAGEAL ECHOCARDIOGRAM (TEE);  Surgeon: Alleen Borne, MD;  Location: [facility] OR;  Service: Open Heart Surgery;  Laterality: N/A;   UMBILICAL HERNIA REPAIR  02/15/2011   Procedure: HERNIA REPAIR UMBILICAL ADULT;  Surgeon: Adolph Pollack, MD;  Location: [facility] ORS;  Service: General;  Laterality: N/A;  umbilical hernia repair with mesh   VASECTOMY  1976   Family History  Problem Relation Age of Onset   Heart disease Mother    Heart disease Father    Cancer Sister        pancreatic   Esophageal cancer Neg Hx    Stomach cancer Neg Hx    Pancreatic cancer Neg Hx    Social History   Socioeconomic History   Marital status: Divorced    Spouse name: Not on file   Number of children: Not on file   Years of education: Not on file   Highest education level: Bachelor's degree (e.g., BA, AB, BS)  Occupational History   Not on file  Tobacco Use   Smoking status: Former    Current packs/day: 0.00    Types: Cigarettes    Start date: 02/21/1982    Quit date: 02/22/1992    Years since quitting: 30.6   Smokeless tobacco: Never   Tobacco comments:    QUIT SMOKING ABOUT 1994 - 1 PP WEEK  Vaping Use   Vaping status: Never Used  Substance and Sexual Activity   Alcohol use: Yes    Comment: 2 GLASSES WINE DAILY   Drug use: No   Sexual activity: Not on file  Other Topics Concern   Not on file  Social History Narrative   Not on file   Social Determinants of Health   Financial Resource Strain: Low Risk  (10/13/2022)   Overall Financial Resource Strain (CARDIA)    Difficulty of Paying Living Expenses: Not hard  at all  Food Insecurity: No Food Insecurity (10/13/2022)   Hunger Vital Sign    Worried About Running Out of Food in the Last Year: Never true    Ran Out of Food in the Last Year: Never true  Transportation Needs: No Transportation Needs (10/13/2022)   PRAPARE - Administrator, Civil Service (Medical): No    Lack of Transportation (Non-Medical): No  Physical Activity: Sufficiently Active (10/13/2022)   Exercise Vital Sign    Days of Exercise per Week: 3  days    Minutes of Exercise per Session: 60 min  Stress: No Stress Concern Present (10/13/2022)   [employer] of Occupational Health - Occupational Stress Questionnaire    Feeling of Stress : Not at all  Social Connections: Moderately Isolated (10/13/2022)   Social Connection and Isolation Panel [NHANES]    Frequency of Communication with Friends and Family: More than three times a week    Frequency of Social Gatherings with Friends and Family: Three times a week    Attends Religious Services: More than 4 times per year    Active Member of Clubs or Organizations: No    Attends Banker Meetings: Never    Marital Status: Divorced    Tobacco Counseling Counseling given: Not Answered Tobacco comments: QUIT SMOKING ABOUT 1994 - 1 PP WEEK   Clinical Intake:  Pre-visit preparation completed: Yes  Pain : No/denies pain     Diabetes: No  How often do you need to have someone help you when you read instructions, pamphlets, or other written materials from your doctor or pharmacy?: 1 - Never  Interpreter Needed?: No  Information entered by :: Kandis Fantasia LPN   Activities of Daily Living    10/13/2022    2:43 PM  In your present state of health, do you have any difficulty performing the following activities:  Hearing? 1  Vision? 0  Difficulty concentrating or making decisions? 0  Walking or climbing stairs? 0  Dressing or bathing? 0  Doing errands, shopping? 0  Preparing Food and eating ? N   Using the Toilet? N  In the past six months, have you accidently leaked urine? N  Do you have problems with loss of bowel control? N  Managing your Medications? N  Managing your Finances? N  Housekeeping or managing your Housekeeping? N    Patient Care Team: Donita Brooks, MD as PCP - General (Family Medicine) Jake Bathe, MD as PCP - Cardiology (Cardiology)  Indicate any recent Medical Services you may have received from other than [facility] providers in the past year (date may be approximate).     Assessment:   This is a routine wellness examination for Alex Martin.  Hearing/Vision screen Hearing Screening - Comments:: Hard of hearing; bilateral hearing aids  Vision Screening - Comments:: Wears rx glasses - up to date with routine eye exams with Dr. Blair Hailey     Goals Addressed             This Visit's Progress    Remain active and independent        Depression Screen    10/13/2022    2:44 PM 05/11/2021   10:33 AM 04/30/2020   10:33 AM 04/16/2019    8:27 AM 11/14/2017    9:52 AM 11/14/2017    9:31 AM 11/14/2017    9:29 AM  PHQ 2/9 Scores  PHQ - 2 Score 0 0 0 0 0 0 0  PHQ- 9 Score  0 0        Fall Risk    10/13/2022    2:47 PM 05/11/2021   10:32 AM 04/30/2020   10:33 AM 04/16/2019    8:27 AM 11/14/2017    9:52 AM  Fall Risk   Falls in the past year? 0 1 0 0 No  Number falls in past yr: 0 1     Injury with Fall? 0 1     Risk for fall due to : No Fall Risks Other (Comment) No  Fall Risks    Follow up Falls prevention discussed;Education provided;Falls evaluation completed  Falls evaluation completed Falls evaluation completed     MEDICARE RISK AT HOME: Medicare Risk at Home Any stairs in or around the home?: No If so, are there any without handrails?: No Home free of loose throw rugs in walkways, pet beds, electrical cords, etc?: Yes Adequate lighting in your home to reduce risk of falls?: Yes Life alert?: No Use of a cane, walker or w/c?: No Grab bars  in the bathroom?: Yes Shower chair or bench in shower?: No Elevated toilet seat or a handicapped toilet?: No  TIMED UP AND GO:  Was the test performed?  Yes  Length of time to ambulate 10 feet: 8 sec Gait steady and fast without use of assistive device    Cognitive Function:        10/13/2022    2:47 PM 11/14/2017    9:32 AM  6CIT Screen  What Year? 0 points 0 points  What month? 0 points 0 points  What time? 0 points 0 points  Count back from 20 0 points 0 points  Months in reverse 0 points 0 points  Repeat phrase 2 points 4 points  Total Score 2 points 4 points    Immunizations Immunization History  Administered Date(s) Administered   Fluad Quad(high Dose 65+) 09/14/2021   Influenza, High Dose Seasonal PF 11/01/2012, 10/14/2015, 09/17/2016, 09/15/2017, 09/11/2018, 09/11/2018, 09/25/2019, 09/14/2022   Influenza,inj,Quad PF,6+ Mos 10/29/2014   Influenza-Unspecified 10/04/2013, 10/14/2015, 09/21/2017, 09/25/2018, 09/25/2019   Moderna SARS-COV2 Booster Vaccination 11/11/2019   Moderna Sars-Covid-2 Vaccination 01/31/2019, 02/28/2019, 04/16/2020   PNEUMOCOCCAL CONJUGATE-20 04/06/2020   Pfizer Covid-19 Vaccine Bivalent Booster 66yrs & up 09/14/2021   Pfizer(Comirnaty)Fall Seasonal Vaccine 12 years and older 11/15/2021, 09/14/2022   Pneumococcal Conjugate-13 10/29/2014   Pneumococcal Polysaccharide-23 06/30/2016   Respiratory Syncytial Virus Vaccine,Recomb Aduvanted(Arexvy) 11/23/2021   Tdap 06/17/2012, 05/23/2022   Zoster Recombinant(Shingrix) 11/16/2017   Zoster, Live 01/18/2007    TDAP status: Up to date  Flu Vaccine status: Up to date  Pneumococcal vaccine status: Up to date  Covid-19 vaccine status: Information provided on how to obtain vaccines.   Qualifies for Shingles Vaccine? Yes   Zostavax completed No   Shingrix Completed?: No.    Education has been provided regarding the importance of this vaccine. Patient has been advised to call insurance company to  determine out of pocket expense if they have not yet received this vaccine. Advised may also receive vaccine at local pharmacy or Health Dept. Verbalized acceptance and understanding.  Screening Tests Health Maintenance  Topic Date Due   Zoster Vaccines- Shingrix (2 of 2) 01/11/2018   COVID-19 Vaccine (7 - 2023-24 season) 11/09/2022   Medicare Annual Wellness (AWV)  10/13/2023   DTaP/Tdap/Td (3 - Td or Tdap) 05/22/2032   Pneumonia Vaccine 67+ Years old  Completed   INFLUENZA VACCINE  Completed   HPV VACCINES  Aged Out   Hepatitis C Screening  Discontinued    Health Maintenance  Health Maintenance Due  Topic Date Due   Zoster Vaccines- Shingrix (2 of 2) 01/11/2018    Colorectal cancer screening: No longer required.   Lung Cancer Screening: (Low Dose CT Chest recommended if Age 35-80 years, 20 pack-year currently smoking OR have quit w/in 15years.) does not qualify.   Lung Cancer Screening Referral: n/a  Additional Screening:  Hepatitis C Screening: does not qualify  Vision Screening: Recommended annual ophthalmology exams for early detection of glaucoma and other disorders  of the eye. Is the patient up to date with their annual eye exam?  Yes  Who is the provider or what is the name of the office in which the patient attends annual eye exams? Dr. Blair Hailey  If pt is not established with a provider, would they like to be referred to a provider to establish care? No .   Dental Screening: Recommended annual dental exams for proper oral hygiene  Community Resource Referral / Chronic Care Management: CRR required this visit?  No   CCM required this visit?  No     Plan:     I have personally reviewed and noted the following in the patient's chart:   Medical and social history Use of alcohol, tobacco or illicit drugs  Current medications and supplements including opioid prescriptions. Patient is not currently taking opioid prescriptions. Functional ability and  status Nutritional status Physical activity Advanced directives List of other physicians Hospitalizations, surgeries, and ER visits in previous 12 months Vitals Screenings to include cognitive, depression, and falls Referrals and appointments  In addition, I have reviewed and discussed with patient certain preventive protocols, quality metrics, and best practice recommendations. A written personalized care plan for preventive services as well as general preventive health recommendations were provided to patient.     Kandis Fantasia [place], [place]   1/61/0960   After Visit Summary: (In Person-Printed) AVS printed and given to the patient  Nurse Notes: Patient is having problems with skin sensitivity on scalp and in both ears; would like to know if he should see dermatology for this or can he make appointment with pcp first.

## 2022-10-24 ENCOUNTER — Ambulatory Visit: Payer: Medicare PPO

## 2022-10-24 VITALS — BP 140/72 | HR 61

## 2022-10-24 DIAGNOSIS — I10 Essential (primary) hypertension: Secondary | ICD-10-CM

## 2022-10-25 ENCOUNTER — Ambulatory Visit (INDEPENDENT_AMBULATORY_CARE_PROVIDER_SITE_OTHER): Payer: Medicare PPO | Admitting: Family Medicine

## 2022-10-25 ENCOUNTER — Encounter: Payer: Self-pay | Admitting: Family Medicine

## 2022-10-25 VITALS — BP 142/68 | HR 62 | Temp 98.2°F | Ht 67.0 in | Wt 166.0 lb

## 2022-10-25 DIAGNOSIS — I951 Orthostatic hypotension: Secondary | ICD-10-CM

## 2022-10-25 NOTE — Progress Notes (Signed)
1  Subjective:    Patient ID: Alex Martin, male    DOB: [birth year], 82 y.o.   MRN: [id number]  Dizziness  Patient is a very pleasant 82 year old Caucasian gentleman who presents today complaining of dizziness.  He recently discovered a water leak in his guest house.  This caused significant damage to the floor joist.  He is having to do the repair work by himself.  This has been working strenuously.  Often he is squatting or kneeling.  When he stands up quickly, he feels dizzy and lightheaded.  The symptoms side quickly.  He denies any vertigo.  Dix-Hallpike maneuver is negative today.  He felt the symptoms could be due to elevated blood pressure because his blood pressure at home has been 142 over 60s.  Here today his physical exam is normal.   Past Medical History:  Diagnosis Date   Abdominal distension    UMBILICAL HERNIA-NO PAIN   Aortic insufficiency 02/23/2021   Echocardiogram 06/13/2018:  EF 60-65, mild LVH, Gr 1 DD, no RWMA, normla RVSF, severe LAE, mild AI   Bleeding from the nose    SEVERAL EPISODES THAT REQUIRED TX IN ER--LAST EPISODE WAS APPROX 1 YR AGO   Blood in urine    MICROSCOPIC BLOODEVALUATED BY DR. NESI FRIDAY 02/11/11--URINE SAMPLE AND EXAM-- AND BLOOD SAMPLE AND TO BE SEEN AGAIN IN 6 MONTHS AND GIVEN VESICARE  FOR FREQUENCY AND URGENCY   CAD (coronary artery disease) 02/22/2021   NSTEMI in 01/2018 s/p CABG Post CABG AFib   Carotid artery disease (HCC) 02/22/2021   Pre CABG Dopplers 02/02/2018:  Bilat ICA 1-39   Coronary artery disease    GERD (gastroesophageal reflux disease)    Hyperlipidemia    Hypertension    Ischemic cardiomyopathy 02/22/2021   EF ~ 30 >> Improved to normal  Echocardiogram 06/13/2018:  EF 60-65, mild LVH, Gr 1 DD, no RWMA, normla RVSF, severe LAE, mild AI Echocardiogram 02/02/2018:  Mod LVH, EF 30-35, WMA c/w LAD infarct, normal RVSF, mild pulmonary hypertension    Past Surgical History:  Procedure Laterality Date   CARDIAC CATHETERIZATION      CORONARY ARTERY BYPASS GRAFT N/A 02/05/2018   Procedure: CORONARY ARTERY BYPASS GRAFTING (CABG) x4 USING BILATERAL IMA'S WITH LEFT GREATER SAPHENOUS VEIN HARVEST AND EXPLORATION OF RIGHT SAPHENOUS;  Surgeon: Alleen Borne, MD;  Location: [facility] OR;  Service: Open Heart Surgery;  Laterality: N/A;   HERNIA REPAIR  1959   [facility]   LEFT HEART CATH AND CORONARY ANGIOGRAPHY N/A 02/02/2018   Procedure: LEFT HEART CATH AND CORONARY ANGIOGRAPHY;  Surgeon: Swaziland, Peter M, MD;  Location: [facility] INVASIVE CV LAB;  Service: Cardiovascular;  Laterality: N/A;   TEE WITHOUT CARDIOVERSION N/A 02/05/2018   Procedure: TRANSESOPHAGEAL ECHOCARDIOGRAM (TEE);  Surgeon: Alleen Borne, MD;  Location: [facility] OR;  Service: Open Heart Surgery;  Laterality: N/A;   UMBILICAL HERNIA REPAIR  02/15/2011   Procedure: HERNIA REPAIR UMBILICAL ADULT;  Surgeon: Adolph Pollack, MD;  Location: [facility] ORS;  Service: General;  Laterality: N/A;  umbilical hernia repair with mesh   VASECTOMY  1976   Current Outpatient Medications on File Prior to Visit  Medication Sig Dispense Refill   aspirin EC 81 MG tablet Take 81 mg by mouth daily.     Cyanocobalamin (VITAMIN B-12 PO) Take 1 tablet by mouth daily.     doxazosin (CARDURA) 2 MG tablet TAKE 1 TABLET BY MOUTH EVERY DAY 90 tablet 1   esomeprazole (NEXIUM) 40 MG capsule  TAKE 1 CAPSULE (40 MG TOTAL) BY MOUTH DAILY. 90 capsule 0   hydrochlorothiazide (HYDRODIURIL) 25 MG tablet TAKE 1 TABLET BY MOUTH EVERY DAY 90 tablet 3   latanoprost (XALATAN) 0.005 % ophthalmic solution Place 1 drop into both eyes at bedtime.   0   losartan (COZAAR) 100 MG tablet TAKE 1 TABLET BY MOUTH EVERY DAY 90 tablet 3   metoprolol succinate (TOPROL-XL) 25 MG 24 hr tablet TAKE 1 TABLET BY MOUTH EVERY DAY 90 tablet 3   Multiple Vitamin (MULTIVITAMIN) tablet Take 1 tablet by mouth daily.     rosuvastatin (CRESTOR) 40 MG tablet Take 1 tablet (40 mg total) by mouth daily. 90 tablet 3   solifenacin (VESICARE) 5 MG tablet Take 5 mg by  mouth daily.     timolol (TIMOPTIC) 0.5 % ophthalmic solution Place 1 drop into both eyes 2 (two) times daily.     No current facility-administered medications on file prior to visit.   Allergies  Allergen Reactions   Amlodipine Swelling    swelling in ankles   Shellfish Allergy Nausea And Vomiting    N&V AFTER EATING MUSSELS   Social History   Socioeconomic History   Marital status: Divorced    Spouse name: Not on file   Number of children: Not on file   Years of education: Not on file   Highest education level: Bachelor's degree (e.g., BA, AB, BS)  Occupational History   Not on file  Tobacco Use   Smoking status: Former    Current packs/day: 0.00    Types: Cigarettes    Start date: 02/21/1982    Quit date: 02/22/1992    Years since quitting: 30.6   Smokeless tobacco: Never   Tobacco comments:    QUIT SMOKING ABOUT 1994 - 1 PP WEEK  Vaping Use   Vaping status: Never Used  Substance and Sexual Activity   Alcohol use: Yes    Comment: 2 GLASSES WINE DAILY   Drug use: No   Sexual activity: Not on file  Other Topics Concern   Not on file  Social History Narrative   Not on file   Social Determinants of Health   Financial Resource Strain: Low Risk  (10/13/2022)   Overall Financial Resource Strain (CARDIA)    Difficulty of Paying Living Expenses: Not hard at all  Food Insecurity: No Food Insecurity (10/13/2022)   Hunger Vital Sign    Worried About Running Out of Food in the Last Year: Never true    Ran Out of Food in the Last Year: Never true  Transportation Needs: No Transportation Needs (10/13/2022)   PRAPARE - Administrator, Civil Service (Medical): No    Lack of Transportation (Non-Medical): No  Physical Activity: Sufficiently Active (10/13/2022)   Exercise Vital Sign    Days of Exercise per Week: 3 days    Minutes of Exercise per Session: 60 min  Stress: No Stress Concern Present (10/13/2022)   [employer] of Occupational Health - Occupational  Stress Questionnaire    Feeling of Stress : Not at all  Social Connections: Moderately Isolated (10/13/2022)   Social Connection and Isolation Panel [NHANES]    Frequency of Communication with Friends and Family: More than three times a week    Frequency of Social Gatherings with Friends and Family: Three times a week    Attends Religious Services: More than 4 times per year    Active Member of Clubs or Organizations: No    Attends Club  or Organization Meetings: Never    Marital Status: Divorced  Catering manager Violence: Not At Risk (10/13/2022)   Humiliation, Afraid, Rape, and Kick questionnaire    Fear of Current or Ex-Partner: No    Emotionally Abused: No    Physically Abused: No    Sexually Abused: No      Review of Systems  Neurological:  Positive for dizziness.  All other systems reviewed and are negative.      Objective:   Physical Exam Vitals reviewed.  Constitutional:      General: He is not in acute distress.    Appearance: Normal appearance. He is well-developed and normal weight. He is not ill-appearing, toxic-appearing or diaphoretic.  HENT:     Head: Normocephalic and atraumatic.     Right Ear: Tympanic membrane, ear canal and external ear normal.     Left Ear: Tympanic membrane, ear canal and external ear normal.     Nose: Nose normal.     Mouth/Throat:     Mouth: Mucous membranes are moist.     Pharynx: No oropharyngeal exudate or posterior oropharyngeal erythema.  Eyes:     General: No scleral icterus.       Right eye: No discharge.        Left eye: No discharge.     Conjunctiva/sclera: Conjunctivae normal.     Pupils: Pupils are equal, round, and reactive to light.  Neck:     Thyroid: No thyromegaly.     Vascular: No carotid bruit or JVD.     Trachea: No tracheal deviation.  Cardiovascular:     Rate and Rhythm: Normal rate and regular rhythm.     Pulses: Normal pulses.     Heart sounds: Normal heart sounds. No murmur heard.    No friction rub.  No gallop.  Pulmonary:     Effort: Pulmonary effort is normal. No respiratory distress.     Breath sounds: Normal breath sounds. No stridor. No wheezing or rales.  Chest:     Chest wall: No tenderness.  Musculoskeletal:     Cervical back: Normal range of motion and neck supple.  Lymphadenopathy:     Cervical: No cervical adenopathy.  Skin:    General: Skin is warm.     Coloration: Skin is not pale.     Findings: No erythema or rash.  Neurological:     Mental Status: He is alert and oriented to person, place, and time.     Cranial Nerves: No cranial nerve deficit.     Motor: No weakness or abnormal muscle tone.     Coordination: Coordination normal.     Gait: Gait normal.     Deep Tendon Reflexes: Reflexes are normal and symmetric. Reflexes normal.  Psychiatric:        Behavior: Behavior normal.        Thought Content: Thought content normal.        Judgment: Judgment normal.           Assessment & Plan:  Orthostatic hypotension  History suggest that the patient is dealing with orthostatic hypotension.  I actually believe treating his blood pressure would make him feel worse.  I have encouraged him to drink plenty of fluids to try to maintain hydration.  Reassess in a few days if not improving.  There is no evidence of a sinus infection or ear infection or vertigo today on his exam.  I do not believe his blood pressure is elevated enough to cause symptoms.

## 2022-11-02 ENCOUNTER — Other Ambulatory Visit: Payer: Self-pay | Admitting: Family Medicine

## 2022-11-02 NOTE — Telephone Encounter (Signed)
Prescription Request  11/02/2022  LOV: 10/25/2022  What is the name of the medication or equipment?   esomeprazole (NEXIUM) 40 MG capsule  **90 day script requested**  Have you contacted your pharmacy to request a refill? Yes   Which pharmacy would you like this sent to?  [id number] [place], [place] - [address] [place] [address] Phone: [phone] Fax: [phone]    Patient notified that their request is being sent to the clinical staff for review and that they should receive a response within 2 business days.   Please advise pharmacist.

## 2022-11-03 MED ORDER — ESOMEPRAZOLE MAGNESIUM 40 MG PO CPDR: 40.0000 mg | DELAYED_RELEASE_CAPSULE | Freq: Every day | ORAL | 1 refills | Status: DC

## 2022-11-03 NOTE — Telephone Encounter (Signed)
Requested Prescriptions  Pending Prescriptions Disp Refills   esomeprazole (NEXIUM) 40 MG capsule 90 capsule 1    Sig: Take 1 capsule (40 mg total) by mouth daily.     Gastroenterology: Proton Pump Inhibitors 2 Failed - 11/02/2022  4:13 PM      Failed - Valid encounter within last 12 months    Recent Outpatient Visits           1 year ago General medical exam   [facility] Donita Brooks, MD   1 year ago Left sided sciatica   [facility] Donita Brooks, MD   2 years ago Blood pressure check   [facility] Tanya Nones, Priscille Heidelberg, MD   2 years ago Prostate cancer screening   [facility] Donita Brooks, MD   3 years ago Left sided sciatica   [facility] Pickard, Priscille Heidelberg, MD       Future Appointments             In 6 months Pickard, Priscille Heidelberg, MD [facility]            Passed - ALT in normal range and within 360 days    ALT  Date Value Ref Range Status  05/10/2022 22 9 - 46 U/L Final         Passed - AST in normal range and within 360 days    AST  Date Value Ref Range Status  05/10/2022 22 10 - 35 U/L Final

## 2022-11-23 DIAGNOSIS — X32XXXD Exposure to sunlight, subsequent encounter: Secondary | ICD-10-CM | POA: Diagnosis not present

## 2022-11-23 DIAGNOSIS — L820 Inflamed seborrheic keratosis: Secondary | ICD-10-CM | POA: Diagnosis not present

## 2022-11-23 DIAGNOSIS — C44319 Basal cell carcinoma of skin of other parts of face: Secondary | ICD-10-CM | POA: Diagnosis not present

## 2022-11-23 DIAGNOSIS — D225 Melanocytic nevi of trunk: Secondary | ICD-10-CM | POA: Diagnosis not present

## 2022-11-23 DIAGNOSIS — L570 Actinic keratosis: Secondary | ICD-10-CM | POA: Diagnosis not present

## 2022-11-23 DIAGNOSIS — L821 Other seborrheic keratosis: Secondary | ICD-10-CM | POA: Diagnosis not present

## 2023-01-19 ENCOUNTER — Telehealth: Payer: Self-pay | Admitting: Family Medicine

## 2023-01-19 NOTE — Telephone Encounter (Signed)
 Patient came to the office with the endoscopy reminder letter he received from [facility]. He's requesting a call back to see if he needs to schedule; stated he had one about 10 years ago.   Please advise at [phone].

## 2023-01-23 ENCOUNTER — Other Ambulatory Visit: Payer: Self-pay | Admitting: Medical Genetics

## 2023-02-15 DIAGNOSIS — H43813 Vitreous degeneration, bilateral: Secondary | ICD-10-CM | POA: Diagnosis not present

## 2023-02-15 DIAGNOSIS — H2513 Age-related nuclear cataract, bilateral: Secondary | ICD-10-CM | POA: Diagnosis not present

## 2023-02-15 DIAGNOSIS — H401131 Primary open-angle glaucoma, bilateral, mild stage: Secondary | ICD-10-CM | POA: Diagnosis not present

## 2023-02-15 DIAGNOSIS — H35032 Hypertensive retinopathy, left eye: Secondary | ICD-10-CM | POA: Diagnosis not present

## 2023-02-22 ENCOUNTER — Other Ambulatory Visit (HOSPITAL_COMMUNITY): Payer: Self-pay | Attending: Oncology

## 2023-03-21 ENCOUNTER — Other Ambulatory Visit: Payer: Self-pay | Admitting: Family Medicine

## 2023-04-11 ENCOUNTER — Ambulatory Visit: Payer: Medicare PPO | Admitting: Gastroenterology

## 2023-04-11 ENCOUNTER — Encounter: Payer: Self-pay | Admitting: Gastroenterology

## 2023-04-11 VITALS — BP 130/60 | HR 64 | Ht 65.0 in | Wt 167.1 lb

## 2023-04-11 DIAGNOSIS — Z79899 Other long term (current) drug therapy: Secondary | ICD-10-CM | POA: Diagnosis not present

## 2023-04-11 DIAGNOSIS — Z860101 Personal history of adenomatous and serrated colon polyps: Secondary | ICD-10-CM

## 2023-04-11 DIAGNOSIS — Z860100 Personal history of colon polyps, unspecified: Secondary | ICD-10-CM

## 2023-04-11 DIAGNOSIS — K219 Gastro-esophageal reflux disease without esophagitis: Secondary | ICD-10-CM | POA: Diagnosis not present

## 2023-04-11 DIAGNOSIS — K2270 Barrett's esophagus without dysplasia: Secondary | ICD-10-CM | POA: Diagnosis not present

## 2023-04-11 MED ORDER — ESOMEPRAZOLE MAGNESIUM 40 MG PO CPDR: 40.0000 mg | DELAYED_RELEASE_CAPSULE | Freq: Every day | ORAL | 3 refills | Status: AC

## 2023-04-11 NOTE — Progress Notes (Signed)
 HPI :  83 year old male, history of CAD status post CABG in 2020, history colon polyps, history of suspected Barrett's esophagus, here to reestablish his GI care.  He was last seen in May 2021.  Recall he has had longstanding reflux, currently controlled on Nexium 40 mg daily.  If he takes the regimen every day he does not have any significant breakthrough that bothers him.  However if he misses doses he can have significant breakthrough of his reflux, he tends to stay compliant takes this every day.  He denies any dysphagia.  No postprandial abdominal pains.  We performed an upper endoscopy to screen for Barrett's in May 2021.  Remarkable findings included a 3 cm hiatal hernia and a suspected 2 cm segment of Barrett's esophagus.  Biopsies of the area surprisingly showed no Barrett's/intestinal metaplasia, no dysplasia.  We had discussed potentially considering her surveillance endoscopy at some point time.  He is feeling well without any high risk or alarm symptoms.  He has no cardiopulmonary symptoms that bother him in light of his history of CABG, he is doing well in that regard.  He otherwise had a colonoscopy at the same time as his endoscopy.  He had 6 small adenomas removed at the time.  He denies any problems with his bowels.  No blood in his stools.  No family history of colon cancer.  We discussed if he want to pursue any further surveillance colonoscopy exams.  He is otherwise feeling well today without complaints   Cologuard 04/24/19 - positive   Echo 06/13/18 - EF 60-65%   EGD 06/10/19: - Esophagogastric landmarks were identified: the Z-line was found at 36 cm, the gastroesophageal junction was found at 38 cm and the upper extent of the gastric folds was found at 41 cm from the incisors. Findings: - There were esophageal mucosal changes suggestive of Barrett's stage C2-M2 (1.5 to 2cm in length) per Prague criteria present in the lower third of the esophagus. The maximum longitudinal  extent of these mucosal changes was 1.5 to 2 cm in length. No nodularity appreciated. Biopsies were taken with a cold forceps for histology. - A 3 cm hiatal hernia was present. - The exam of the esophagus was otherwise normal. - A few small sessile polyps were found in the gastric body and in the gastric antrum. Biopsies were taken with a cold forceps for histology from a few of them as representative samples. - Patchy mild inflammation characterized by erythema was found in the gastric body and in the gastric antrum. Biopsies were taken with a cold forceps for histology. - The exam of the stomach was otherwise normal. - The duodenal bulb and second portion of the duodenum were normal.   Colonoscopy 06/10/19: - The perianal and digital rectal examinations were normal. - The terminal ileum appeared normal. - Four sessile polyps were found in the ascending colon. The polyps were 3 to 4 mm in size. These polyps were removed with a cold snare. Resection and retrieval were complete. - A few small-mouthed diverticula were found in the ascending colon. - A 3 mm polyp was found in the hepatic flexure. The polyp was sessile. The polyp was removed with a cold snare. Resection and retrieval were complete. - A 4 mm polyp was found in the transverse colon. The polyp was sessile. The polyp was removed with a cold snare. Resection and retrieval were complete. - The colon was tortuous. - The exam was otherwise without abnormality.  Diagnosis 1. Surgical [  P], gastric antrum and gastric body - ANTRAL AND OXYNTIC MUCOSA WITH MILD CHRONIC GASTRITIS. - WARTHIN-STARRY NEGATIVE FOR HELICOBACTER PYLORI. - NO INTESTINAL METAPLASIA, DYSPLASIA OR CARCINOMA. 2. Surgical [P], gastric polyps (2) - HYPERPLASTIC GASTRIC POLYP (2). - NO INTESTINAL METAPLASIA, ADENOMATOUS CHANGE OR CARCINOMA. 3. Surgical [P], lower esophagus bx - GASTROESOPHAGEAL MUCOSA WITH MILD INFLAMMATION CONSISTENT WITH REFLUX. - NO INTESTINAL METAPLASIA,  DYSPLASIA OR CARCINOMA. 4. Surgical [P], colon, ascending, hepatic flexure, transverse, polyp (6) - TUBULAR ADENOMA(S). - NO HIGH GRADE DYSPLASIA OR CARCINOMA.  Past Medical History:  Diagnosis Date   Abdominal distension    UMBILICAL HERNIA-NO PAIN   Aortic insufficiency 02/23/2021   Echocardiogram 06/13/2018:  EF 60-65, mild LVH, Gr 1 DD, no RWMA, normla RVSF, severe LAE, mild AI   Barrett's esophagus    Bleeding from the nose    SEVERAL EPISODES THAT REQUIRED TX IN ER--LAST EPISODE WAS APPROX 1 YR AGO   Blood in urine    MICROSCOPIC BLOODEVALUATED BY DR. NESI FRIDAY 02/11/11--URINE SAMPLE AND EXAM-- AND BLOOD SAMPLE AND TO BE SEEN AGAIN IN 6 MONTHS AND GIVEN VESICARE  FOR FREQUENCY AND URGENCY   CAD (coronary artery disease) 02/22/2021   NSTEMI in 01/2018 s/p CABG Post CABG AFib   Carotid artery disease (HCC) 02/22/2021   Pre CABG Dopplers 02/02/2018:  Bilat ICA 1-39   Coronary artery disease    GERD (gastroesophageal reflux disease)    Hyperlipidemia    Hypertension    Ischemic cardiomyopathy 02/22/2021   EF ~ 30 >> Improved to normal  Echocardiogram 06/13/2018:  EF 60-65, mild LVH, Gr 1 DD, no RWMA, normla RVSF, severe LAE, mild AI Echocardiogram 02/02/2018:  Mod LVH, EF 30-35, WMA c/w LAD infarct, normal RVSF, mild pulmonary hypertension      Past Surgical History:  Procedure Laterality Date   CARDIAC CATHETERIZATION     CORONARY ARTERY BYPASS GRAFT N/A 02/05/2018   Procedure: CORONARY ARTERY BYPASS GRAFTING (CABG) x4 USING BILATERAL IMA'S WITH LEFT GREATER SAPHENOUS VEIN HARVEST AND EXPLORATION OF RIGHT SAPHENOUS;  Surgeon: Alleen Borne, MD;  Location: [facility] OR;  Service: Open Heart Surgery;  Laterality: N/A;   HERNIA REPAIR  1959   [facility]   LEFT HEART CATH AND CORONARY ANGIOGRAPHY N/A 02/02/2018   Procedure: LEFT HEART CATH AND CORONARY ANGIOGRAPHY;  Surgeon: Swaziland, Peter M, MD;  Location: [facility] INVASIVE CV LAB;  Service: Cardiovascular;  Laterality: N/A;   TEE WITHOUT CARDIOVERSION  N/A 02/05/2018   Procedure: TRANSESOPHAGEAL ECHOCARDIOGRAM (TEE);  Surgeon: Alleen Borne, MD;  Location: [facility] OR;  Service: Open Heart Surgery;  Laterality: N/A;   UMBILICAL HERNIA REPAIR  02/15/2011   Procedure: HERNIA REPAIR UMBILICAL ADULT;  Surgeon: Adolph Pollack, MD;  Location: [facility]S;  Service: General;  Laterality: N/A;  umbilical hernia repair with mesh   VASECTOMY  1976   Family History  Problem Relation Age of Onset   Heart disease Mother    Heart disease Father    Cancer Sister        pancreatic   Esophageal cancer Neg Hx    Stomach cancer Neg Hx    Pancreatic cancer Neg Hx    Social History   Tobacco Use   Smoking status: Former    Current packs/day: 0.00    Types: Cigarettes    Start date: 02/21/1982    Quit date: 02/22/1992    Years since quitting: 31.1   Smokeless tobacco: Never   Tobacco comments:    QUIT SMOKING ABOUT 1994 -  1 PP WEEK  Vaping Use   Vaping status: Never Used  Substance Use Topics   Alcohol use: Yes    Comment: 2 GLASSES WINE DAILY   Drug use: No   Current Outpatient Medications  Medication Sig Dispense Refill   aspirin EC 81 MG tablet Take 81 mg by mouth daily.     Cyanocobalamin (VITAMIN B-12 PO) Take 1 tablet by mouth daily.     doxazosin (CARDURA) 2 MG tablet TAKE 1 TABLET BY MOUTH EVERY DAY 90 tablet 1   esomeprazole (NEXIUM) 40 MG capsule Take 1 capsule (40 mg total) by mouth daily. 90 capsule 1   hydrochlorothiazide (HYDRODIURIL) 25 MG tablet TAKE 1 TABLET BY MOUTH EVERY DAY 90 tablet 3   latanoprost (XALATAN) 0.005 % ophthalmic solution Place 1 drop into both eyes at bedtime.   0   losartan (COZAAR) 100 MG tablet TAKE 1 TABLET BY MOUTH EVERY DAY 90 tablet 3   metoprolol succinate (TOPROL-XL) 25 MG 24 hr tablet TAKE 1 TABLET BY MOUTH EVERY DAY 90 tablet 3   Multiple Vitamin (MULTIVITAMIN) tablet Take 1 tablet by mouth daily.     rosuvastatin (CRESTOR) 40 MG tablet Take 1 tablet (40 mg total) by mouth daily. 90 tablet 3   solifenacin  (VESICARE) 10 MG tablet Take 10 mg by mouth daily.     timolol (TIMOPTIC) 0.5 % ophthalmic solution Place 1 drop into both eyes 2 (two) times daily.     No current facility-administered medications for this visit.   Allergies  Allergen Reactions   Amlodipine Swelling    swelling in ankles   Shellfish Allergy Nausea And Vomiting    N&V AFTER EATING MUSSELS     Review of Systems: All systems reviewed and negative except where noted in HPI.   Lab Results  Component Value Date   WBC 5.9 05/10/2022   HGB 15.5 05/10/2022   HCT 46.9 05/10/2022   MCV 98.3 05/10/2022   PLT 200 05/10/2022    Lab Results  Component Value Date   NA 140 05/10/2022   CL 102 05/10/2022   K 5.2 05/10/2022   CO2 27 05/10/2022   BUN 27 (H) 05/10/2022   CREATININE 1.12 05/10/2022   EGFR 66 05/10/2022   CALCIUM 9.2 05/10/2022   ALBUMIN 4.0 06/20/2018   GLUCOSE 110 (H) 05/10/2022    Lab Results  Component Value Date   ALT 22 05/10/2022   AST 22 05/10/2022   ALKPHOS 87 06/20/2018   BILITOT 0.7 05/10/2022     Physical Exam: BP 130/60 (BP Location: Left Arm, Patient Position: Sitting, Cuff Size: Normal)   Pulse 64   Ht 5\' 5"  (1.651 m) Comment: height measured without shoes  Wt 167 lb 2 oz (75.8 kg)   BMI 27.81 kg/m  Constitutional: Pleasant,well-developed, male in no acute distress. HEENT: Normocephalic and atraumatic. Conjunctivae are normal. No scleral icterus. Neck supple.  Cardiovascular: Normal rate, regular rhythm.  Pulmonary/chest: Effort normal and breath sounds normal.  Abdominal: Soft, nondistended, nontender.  There are no masses palpable. No hepatomegaly. Extremities: no edema Lymphadenopathy: No cervical adenopathy noted. Neurological: Alert and oriented to person place and time. Skin: Skin is warm and dry. No rashes noted. Psychiatric: Normal mood and affect. Behavior is normal.   ASSESSMENT: 83 y.o. male here for assessment of the following  1. Barrett's esophagus  without dysplasia   2. Gastroesophageal reflux disease, unspecified whether esophagitis present   3. Long-term current use of proton pump inhibitor therapy  4. History of colon polyps    Endoscopically he had a suspected 2 cm segment of Barrett's back in 2021.  Biopsies were surprisingly from Barrett's endoscopically this overtly appear like he had a short segment of Barrett's.  We discussed what Barrett's is, risks for progression and risk for malignancy, which is hopefully low during his lifetime given the short nature of the segment.  We did discuss continued use of PPI to help reduce risk of progression.  We discussed long-term risks and benefits of chronic PPI use.  Long-term want to use the lowest dose needed to control symptoms.  I feel he is currently at the lowest dose needed to control symptoms, if he misses any dosing he will have quick recurrence symptoms.  He feels comfortable continuing Nexium at current dosing and will continue that for now.  We discussed that normally we would consider surveillance endoscopy 5 years from time of diagnosis to reassess the short segment of Barrett's esophagus.  At that time he will be 83 years old, next year.  We discussed if he wanted to pursue any further endoscopic evaluations of this.  We discussed risk benefits of endoscopy.  He wants to think about this right now and I will see him in 1 year for reassessment to determine if he wants to have another endoscopy.  He had no high risk lesions associated with this segment and may be reasonable to observe he is otherwise feeling well, given his age.  Otherwise if he has any alarm symptoms or dysphagia etc. he will contact me.  I reviewed his history of colon polyps.  No high risk lesions but he did have 6 adenomas on his last exam.  We discussed that his age if he wanted to have any further surveillance colonoscopy exams.  We discussed risks and benefits of colonoscopy and anesthesia.  He is comfortable  stopping further surveillance given his age, he had no high risk lesions on his last exam, and I think that is reasonable.   PLAN: - counseled on BE and long term risks of chronic PPI - continue nexium - refilled for one year, using lowest dose possible to control symptoms - discussed surveillance EGD and colonoscopy extensively as outlined above - f/u 1 year - consider EGD next year for surveillance - he declines surveillance colonoscopy moving forward in light of his age  Harlin Rain, MD [facility]  CC: Donita Brooks, MD

## 2023-04-11 NOTE — Patient Instructions (Addendum)
 We have sent the following medications to your pharmacy for you to pick up at your convenience: Nexium   Please follow up in 1 year.  Thank you for entrusting me with your care and for choosing [facility], Dr. Ileene Patrick     If your blood pressure at your visit was 140/90 or greater, please contact your primary care physician to follow up on this. ______________________________________________________  If you are age 83 or older, your body mass index should be between 23-30. Your Body mass index is 27.81 kg/m. If this is out of the aforementioned range listed, please consider follow up with your Primary Care Provider.  If you are age 42 or younger, your body mass index should be between 19-25. Your Body mass index is 27.81 kg/m. If this is out of the aformentioned range listed, please consider follow up with your Primary Care Provider.  ________________________________________________________  The [facility] providers would like to encourage you to use [facility] to communicate with providers for non-urgent requests or questions.  Due to long hold times on the telephone, sending your provider a message by [facility] may be a faster and more efficient way to get a response.  Please allow 48 business hours for a response.  Please remember that this is for non-urgent requests.  _______________________________________________________  Due to recent changes in healthcare laws, you may see the results of your imaging and laboratory studies on MyChart before your provider has had a chance to review them.  We understand that in some cases there may be results that are confusing or concerning to you. Not all laboratory results come back in the same time frame and the provider may be waiting for multiple results in order to interpret others.  Please give [place] 48 hours in order for your provider to thoroughly review all the results before contacting the office for clarification of your results.

## 2023-04-13 DIAGNOSIS — N401 Enlarged prostate with lower urinary tract symptoms: Secondary | ICD-10-CM | POA: Diagnosis not present

## 2023-04-13 DIAGNOSIS — R350 Frequency of micturition: Secondary | ICD-10-CM | POA: Diagnosis not present

## 2023-04-24 ENCOUNTER — Ambulatory Visit: Payer: Medicare PPO | Attending: Cardiology | Admitting: Cardiology

## 2023-04-24 ENCOUNTER — Encounter: Payer: Self-pay | Admitting: Cardiology

## 2023-04-24 VITALS — BP 148/72 | HR 57 | Ht 67.0 in | Wt 167.4 lb

## 2023-04-24 DIAGNOSIS — Z951 Presence of aortocoronary bypass graft: Secondary | ICD-10-CM | POA: Diagnosis not present

## 2023-04-24 DIAGNOSIS — I255 Ischemic cardiomyopathy: Secondary | ICD-10-CM

## 2023-04-24 DIAGNOSIS — I2510 Atherosclerotic heart disease of native coronary artery without angina pectoris: Secondary | ICD-10-CM | POA: Diagnosis not present

## 2023-04-24 NOTE — Progress Notes (Signed)
 Cardiology Office Note:  .   Date:  04/24/2023  ID:  Alex Martin, DOB July 08, 1940, MRN [id number] PCP: Donita Brooks, MD  [facility] Providers Cardiologist:  Donato Schultz, MD     History of Present Illness: Alex Martin is a 83 y.o. male Discussed the use of AI scribe software for clinical note transcription with the patient, who gave verbal consent to proceed.  History of Present Illness Alex Martin "Alex Martin" is an 83 year old male with coronary artery disease and ischemic cardiomyopathy who presents for follow-up after prior bypass surgery.  He underwent coronary artery bypass grafting (CABG) times four in January 2020 due to a heart attack and ischemic cardiomyopathy. His ejection fraction is between 30% and 35%. He has no chest pain and no issues during physical activities such as biking and playing pickleball.  He is currently on aspirin 81 mg, losartan 100 mg, metoprolol 25 mg, hydrochlorothiazide 25 mg, and rosuvastatin 40 mg. He engages in physical activities, including biking and playing pickleball [place] [employer] [place] [employer]. He recently participated in a bike ride at [employer] and enjoys riding on trails like [place] and [place].  He experiences itchy eyes and a runny nose due to pollen, which has caused allergic reactions in the past, including swelling of the conjunctiva. He recalls a previous incident where sweat mixed with pollen caused significant eye irritation.     Studies Reviewed: Marland Kitchen   EKG Interpretation Date/Time:  Monday April 24 2023 10:30:06 EDT Ventricular Rate:  57 PR Interval:  226 QRS Duration:  92 QT Interval:  416 QTC Calculation: 404 R Axis:   43  Text Interpretation: Sinus bradycardia with 1st degree A-V block When compared with ECG of 09-Feb-2018 06:51, Nonspecific T wave abnormality, improved in Inferior leads Confirmed by Donato Schultz ([id number]) on 04/24/2023 10:48:31 AM    Results LABS LDL: 77 mg/dL  (6213)  DIAGNOSTIC EF: 30-35%, repeat 65% 2020 Risk Assessment/Calculations:           Physical Exam:   VS:  BP (!) 148/72   Pulse (!) 57   Ht 5\' 7"  (1.702 m)   Wt 167 lb 6.4 oz (75.9 kg)   SpO2 95%   BMI 26.22 kg/m    Wt Readings from Last 3 Encounters:  04/24/23 167 lb 6.4 oz (75.9 kg)  04/11/23 167 lb 2 oz (75.8 kg)  10/25/22 166 lb (75.3 kg)    GEN: Well nourished, well developed in no acute distress NECK: No JVD; No carotid bruits CARDIAC: RRR, no murmurs, no rubs, no gallops RESPIRATORY:  Clear to auscultation without rales, wheezing or rhonchi  ABDOMEN: Soft, non-tender, non-distended EXTREMITIES:  No edema; No deformity   ASSESSMENT AND PLAN: .    Assessment and Plan Assessment & Plan Coronary artery disease with ischemic cardiomyopathy Status post coronary artery bypass grafting (CABG) x4 in January 2020 following a myocardial infarction. Ischemic cardiomyopathy with an ejection fraction of 30-35%. Now 60-65% repeat ECHO 2020. Reports no chest pain and engages in physical activities such as biking and playing pickleball without issues. Current medications include aspirin, losartan, metoprolol, hydrochlorothiazide, and rosuvastatin. LDL was 77 last year, close to the target of less than 70. Encouraged to maintain regular exercise and a Mediterranean diet to manage cardiovascular health. - Continue aspirin 81 mg daily - Continue losartan 100 mg daily - Continue metoprolol 25 mg daily - Continue hydrochlorothiazide 25 mg daily - Continue rosuvastatin  40 mg daily - Encourage regular exercise and adherence to a Mediterranean diet - Monitor blood pressure at home in a relaxed environment  Hypertension Blood pressure recorded at 148/72 mmHg, slightly elevated. On losartan, metoprolol, and hydrochlorothiazide for blood pressure management. Advised to periodically check blood pressure at home in a relaxed environment to ensure accurate readings. - Continue current  antihypertensive regimen - Monitor blood pressure at home  Allergic conjunctivitis Reports itchy eyes, likely due to pollen exposure. Experienced similar symptoms in the past, including an allergic reaction with swelling of the conjunctiva. Advised on managing pollen exposure and symptomatic relief for itchy eyes. - Advise on managing pollen exposure and symptomatic relief for itchy eyes  Varicose vein Has a varicose vein present for a couple of years. Not associated with a blood clot and located in the venous system, not an artery. Advised that if it gets nicked or opens, it could bleed, and to hold pressure if that occurs. - Advise on monitoring the varicose vein for any changes or bleeding          Signed, Donato Schultz, MD

## 2023-04-24 NOTE — Patient Instructions (Signed)

## 2023-05-06 ENCOUNTER — Other Ambulatory Visit: Payer: Self-pay | Admitting: Cardiology

## 2023-05-07 ENCOUNTER — Other Ambulatory Visit: Payer: Self-pay | Admitting: Cardiology

## 2023-05-10 DIAGNOSIS — X32XXXD Exposure to sunlight, subsequent encounter: Secondary | ICD-10-CM | POA: Diagnosis not present

## 2023-05-10 DIAGNOSIS — D0422 Carcinoma in situ of skin of left ear and external auricular canal: Secondary | ICD-10-CM | POA: Diagnosis not present

## 2023-05-10 DIAGNOSIS — Z85828 Personal history of other malignant neoplasm of skin: Secondary | ICD-10-CM | POA: Diagnosis not present

## 2023-05-10 DIAGNOSIS — L570 Actinic keratosis: Secondary | ICD-10-CM | POA: Diagnosis not present

## 2023-05-10 DIAGNOSIS — Z08 Encounter for follow-up examination after completed treatment for malignant neoplasm: Secondary | ICD-10-CM | POA: Diagnosis not present

## 2023-05-15 ENCOUNTER — Other Ambulatory Visit: Payer: Medicare PPO

## 2023-05-15 DIAGNOSIS — I10 Essential (primary) hypertension: Secondary | ICD-10-CM

## 2023-05-15 DIAGNOSIS — E7800 Pure hypercholesterolemia, unspecified: Secondary | ICD-10-CM

## 2023-05-15 DIAGNOSIS — Z0000 Encounter for general adult medical examination without abnormal findings: Secondary | ICD-10-CM

## 2023-05-15 DIAGNOSIS — Z125 Encounter for screening for malignant neoplasm of prostate: Secondary | ICD-10-CM | POA: Diagnosis not present

## 2023-05-16 LAB — CBC WITH DIFFERENTIAL/PLATELET
Absolute Lymphocytes: 1714 {cells}/uL (ref 850–3900)
Absolute Monocytes: 799 {cells}/uL (ref 200–950)
Basophils Absolute: 73 {cells}/uL (ref 0–200)
Basophils Relative: 1.2 %
Eosinophils Absolute: 384 {cells}/uL (ref 15–500)
Eosinophils Relative: 6.3 %
HCT: 43.6 % (ref 38.5–50.0)
Hemoglobin: 14.9 g/dL (ref 13.2–17.1)
MCH: 34.5 pg — ABNORMAL HIGH (ref 27.0–33.0)
MCHC: 34.2 g/dL (ref 32.0–36.0)
MCV: 100.9 fL — ABNORMAL HIGH (ref 80.0–100.0)
MPV: 10.2 fL (ref 7.5–12.5)
Monocytes Relative: 13.1 %
Neutro Abs: 3129 {cells}/uL (ref 1500–7800)
Neutrophils Relative %: 51.3 %
Platelets: 203 10*3/uL (ref 140–400)
RBC: 4.32 10*6/uL (ref 4.20–5.80)
RDW: 12.5 % (ref 11.0–15.0)
Total Lymphocyte: 28.1 %
WBC: 6.1 10*3/uL (ref 3.8–10.8)

## 2023-05-16 LAB — COMPLETE METABOLIC PANEL WITHOUT GFR
AG Ratio: 1.6 (calc) (ref 1.0–2.5)
ALT: 13 U/L (ref 9–46)
AST: 17 U/L (ref 10–35)
Albumin: 3.9 g/dL (ref 3.6–5.1)
Alkaline phosphatase (APISO): 61 U/L (ref 35–144)
BUN: 21 mg/dL (ref 7–25)
CO2: 33 mmol/L — ABNORMAL HIGH (ref 20–32)
Calcium: 9.1 mg/dL (ref 8.6–10.3)
Chloride: 100 mmol/L (ref 98–110)
Creat: 1.03 mg/dL (ref 0.70–1.22)
Globulin: 2.5 g/dL (ref 1.9–3.7)
Glucose, Bld: 100 mg/dL — ABNORMAL HIGH (ref 65–99)
Potassium: 4.6 mmol/L (ref 3.5–5.3)
Sodium: 140 mmol/L (ref 135–146)
Total Bilirubin: 0.7 mg/dL (ref 0.2–1.2)
Total Protein: 6.4 g/dL (ref 6.1–8.1)

## 2023-05-16 LAB — PSA: PSA: 0.68 ng/mL (ref <=4.00)

## 2023-05-16 LAB — LIPID PANEL
Cholesterol: 137 mg/dL (ref <200)
HDL: 55 mg/dL (ref >=40)
LDL Cholesterol (Calc): 63 mg/dL
Non-HDL Cholesterol (Calc): 82 mg/dL (ref <130)
Total CHOL/HDL Ratio: 2.5 (calc) (ref <5.0)
Triglycerides: 108 mg/dL (ref <150)

## 2023-05-18 ENCOUNTER — Encounter: Payer: Self-pay | Admitting: Family Medicine

## 2023-05-18 ENCOUNTER — Ambulatory Visit (INDEPENDENT_AMBULATORY_CARE_PROVIDER_SITE_OTHER): Payer: Medicare PPO | Admitting: Family Medicine

## 2023-05-18 VITALS — BP 118/66 | HR 60 | Temp 97.8°F | Ht 67.0 in | Wt 166.0 lb

## 2023-05-18 DIAGNOSIS — I10 Essential (primary) hypertension: Secondary | ICD-10-CM | POA: Diagnosis not present

## 2023-05-18 DIAGNOSIS — Z125 Encounter for screening for malignant neoplasm of prostate: Secondary | ICD-10-CM | POA: Diagnosis not present

## 2023-05-18 DIAGNOSIS — E7800 Pure hypercholesterolemia, unspecified: Secondary | ICD-10-CM | POA: Diagnosis not present

## 2023-05-18 DIAGNOSIS — Z0001 Encounter for general adult medical examination with abnormal findings: Secondary | ICD-10-CM

## 2023-05-18 DIAGNOSIS — Z951 Presence of aortocoronary bypass graft: Secondary | ICD-10-CM

## 2023-05-18 DIAGNOSIS — Z0000 Encounter for general adult medical examination without abnormal findings: Secondary | ICD-10-CM

## 2023-05-18 NOTE — Progress Notes (Signed)
 1  Subjective:    Patient ID: Alex Martin, male    DOB: [birth year], 83 y.o.   MRN: [id number]  HPI Patient is a very pleasant 83 year old Caucasian male who presents today for complete physical exam. In 2019, he suffered a non-ST elevation myocardial infarction.  He underwent coronary artery bypass grafting x4.  He denies any chest pain shortness of breath or dyspnea on exertion today.  Patient had a colonoscopy 2021 and they found several polyps.  They recommended a repeat colonoscopy in 3 years (2024).  He also had an EGD which showed changes consistent with Barrett's esophagus.  Due to his age, I do not feel that he requires another colonoscopy.  His immunizations are up-to-date including the shingles vaccine, the pneumonia vaccine, and RSV shot.  Recently saw his dermatologist who froze 2 lesions, 1 in his left ear, and 1 on his right tragus. Immunization History  Administered Date(s) Administered   Fluad Quad(high Dose 65+) 09/14/2021   Influenza, High Dose Seasonal PF 11/01/2012, 10/14/2015, 09/17/2016, 09/15/2017, 09/11/2018, 09/11/2018, 09/25/2019, 09/14/2022   Influenza,inj,Quad PF,6+ Mos 10/29/2014   Influenza-Unspecified 10/04/2013, 10/14/2015, 09/21/2017, 09/25/2018, 09/25/2019   Moderna SARS-COV2 Booster Vaccination 11/11/2019   Moderna Sars-Covid-2 Vaccination 01/31/2019, 02/28/2019, 04/16/2020   PNEUMOCOCCAL CONJUGATE-20 04/06/2020   Pfizer Covid-19 Vaccine Bivalent Booster 62yrs & up 09/14/2021   Pfizer(Comirnaty)Fall Seasonal Vaccine 12 years and older 11/15/2021, 09/14/2022   Pneumococcal Conjugate-13 10/29/2014   Pneumococcal Polysaccharide-23 06/30/2016   Respiratory Syncytial Virus Vaccine,Recomb Aduvanted(Arexvy) 11/23/2021   Tdap 06/17/2012, 05/23/2022   Zoster Recombinant(Shingrix) 11/16/2017, 09/14/2022   Zoster, Live 01/18/2007    Past Medical History:  Diagnosis Date   Abdominal distension    UMBILICAL HERNIA-NO PAIN   Aortic insufficiency 02/23/2021    Echocardiogram 06/13/2018:  EF 60-65, mild LVH, Gr 1 DD, no RWMA, normla RVSF, severe LAE, mild AI   Barrett's esophagus    Bleeding from the nose    SEVERAL EPISODES THAT REQUIRED TX IN ER--LAST EPISODE WAS APPROX 1 YR AGO   Blood in urine    MICROSCOPIC BLOODEVALUATED BY DR. NESI FRIDAY 02/11/11--URINE SAMPLE AND EXAM-- AND BLOOD SAMPLE AND TO BE SEEN AGAIN IN 6 MONTHS AND GIVEN VESICARE   FOR FREQUENCY AND URGENCY   CAD (coronary artery disease) 02/22/2021   NSTEMI in 01/2018 s/p CABG Post CABG AFib   Carotid artery disease (HCC) 02/22/2021   Pre CABG Dopplers 02/02/2018:  Bilat ICA 1-39   Coronary artery disease    GERD (gastroesophageal reflux disease)    Hyperlipidemia    Hypertension    Ischemic cardiomyopathy 02/22/2021   EF ~ 30 >> Improved to normal  Echocardiogram 06/13/2018:  EF 60-65, mild LVH, Gr 1 DD, no RWMA, normla RVSF, severe LAE, mild AI Echocardiogram 02/02/2018:  Mod LVH, EF 30-35, WMA c/w LAD infarct, normal RVSF, mild pulmonary hypertension    Past Surgical History:  Procedure Laterality Date   CARDIAC CATHETERIZATION     CORONARY ARTERY BYPASS GRAFT N/A 02/05/2018   Procedure: CORONARY ARTERY BYPASS GRAFTING (CABG) x4 USING BILATERAL IMA'S WITH LEFT GREATER SAPHENOUS VEIN HARVEST AND EXPLORATION OF RIGHT SAPHENOUS;  Surgeon: Bartley Lightning, MD;  Location: [facility] OR;  Service: Open Heart Surgery;  Laterality: N/A;   HERNIA REPAIR  1959   [facility]   LEFT HEART CATH AND CORONARY ANGIOGRAPHY N/A 02/02/2018   Procedure: LEFT HEART CATH AND CORONARY ANGIOGRAPHY;  Surgeon: [place], Peter M, MD;  Location: [facility] INVASIVE CV LAB;  Service: Cardiovascular;  Laterality: N/A;   TEE WITHOUT  CARDIOVERSION N/A 02/05/2018   Procedure: TRANSESOPHAGEAL ECHOCARDIOGRAM (TEE);  Surgeon: Bartley Lightning, MD;  Location: [facility] OR;  Service: Open Heart Surgery;  Laterality: N/A;   UMBILICAL HERNIA REPAIR  02/15/2011   Procedure: HERNIA REPAIR UMBILICAL ADULT;  Surgeon: Harlee Lichtenstein, MD;  Location: [facility] ORS;   Service: General;  Laterality: N/A;  umbilical hernia repair with mesh   VASECTOMY  1976   Current Outpatient Medications on File Prior to Visit  Medication Sig Dispense Refill   aspirin  EC 81 MG tablet Take 81 mg by mouth daily.     Cyanocobalamin (VITAMIN B-12 PO) Take 1 tablet by mouth daily.     esomeprazole  (NEXIUM ) 40 MG capsule Take 1 capsule (40 mg total) by mouth daily. 90 capsule 3   hydrochlorothiazide  (HYDRODIURIL ) 25 MG tablet TAKE 1 TABLET BY MOUTH EVERY DAY 90 tablet 3   latanoprost (XALATAN) 0.005 % ophthalmic solution Place 1 drop into both eyes at bedtime.   0   losartan  (COZAAR ) 100 MG tablet TAKE 1 TABLET BY MOUTH EVERY DAY 90 tablet 3   metoprolol  succinate (TOPROL -XL) 25 MG 24 hr tablet TAKE 1 TABLET BY MOUTH EVERY DAY 90 tablet 3   Multiple Vitamin (MULTIVITAMIN) tablet Take 1 tablet by mouth daily.     rosuvastatin  (CRESTOR ) 40 MG tablet Take 1 tablet (40 mg total) by mouth daily. 90 tablet 3   solifenacin  (VESICARE ) 10 MG tablet Take 10 mg by mouth daily.     timolol (TIMOPTIC) 0.5 % ophthalmic solution Place 1 drop into both eyes 2 (two) times daily.     No current facility-administered medications on file prior to visit.   Allergies  Allergen Reactions   Amlodipine  Swelling    swelling in ankles   Social History   Socioeconomic History   Marital status: Divorced    Spouse name: Not on file   Number of children: Not on file   Years of education: Not on file   Highest education level: Bachelor's degree (e.g., BA, AB, BS)  Occupational History   Not on file  Tobacco Use   Smoking status: Former    Current packs/day: 0.00    Types: Cigarettes    Start date: 02/21/1982    Quit date: 02/22/1992    Years since quitting: 31.2   Smokeless tobacco: Never   Tobacco comments:    QUIT SMOKING ABOUT 1994 - 1 PP WEEK  Vaping Use   Vaping status: Never Used  Substance and Sexual Activity   Alcohol use: Yes    Comment: 2 GLASSES WINE DAILY   Drug use: No    Sexual activity: Not on file  Other Topics Concern   Not on file  Social History Narrative   Not on file   Social Drivers of Health   Financial Resource Strain: Low Risk  (10/13/2022)   Overall Financial Resource Strain (CARDIA)    Difficulty of Paying Living Expenses: Not hard at all  Food Insecurity: No Food Insecurity (10/13/2022)   Hunger Vital Sign    Worried About Running Out of Food in the Last Year: Never true    Ran Out of Food in the Last Year: Never true  Transportation Needs: No Transportation Needs (10/13/2022)   PRAPARE - Administrator, Civil Service (Medical): No    Lack of Transportation (Non-Medical): No  Physical Activity: Sufficiently Active (10/13/2022)   Exercise Vital Sign    Days of Exercise per Week: 3 days    Minutes of  Exercise per Session: 60 min  Stress: No Stress Concern Present (10/13/2022)   [employer] of Occupational Health - Occupational Stress Questionnaire    Feeling of Stress : Not at all  Social Connections: Moderately Isolated (10/13/2022)   Social Connection and Isolation Panel [NHANES]    Frequency of Communication with Friends and Family: More than three times a week    Frequency of Social Gatherings with Friends and Family: Three times a week    Attends Religious Services: More than 4 times per year    Active Member of Clubs or Organizations: No    Attends Banker Meetings: Never    Marital Status: Divorced  Catering manager Violence: Not At Risk (10/13/2022)   Humiliation, Afraid, Rape, and Kick questionnaire    Fear of Current or Ex-Partner: No    Emotionally Abused: No    Physically Abused: No    Sexually Abused: No      Review of Systems  All other systems reviewed and are negative.      Objective:   Physical Exam Vitals reviewed.  Constitutional:      General: He is not in acute distress.    Appearance: He is well-developed. He is not diaphoretic.  HENT:     Head: Normocephalic and  atraumatic.     Right Ear: External ear normal.     Left Ear: External ear normal.     Nose: Nose normal.     Mouth/Throat:     Pharynx: No oropharyngeal exudate.  Eyes:     General: No scleral icterus.       Right eye: No discharge.        Left eye: No discharge.     Conjunctiva/sclera: Conjunctivae normal.     Pupils: Pupils are equal, round, and reactive to light.  Neck:     Thyroid: No thyromegaly.     Vascular: No JVD.     Trachea: No tracheal deviation.  Cardiovascular:     Rate and Rhythm: Normal rate and regular rhythm.     Heart sounds: Normal heart sounds. No murmur heard.    No friction rub. No gallop.  Pulmonary:     Effort: Pulmonary effort is normal. No respiratory distress.     Breath sounds: Normal breath sounds. No stridor. No wheezing or rales.  Chest:     Chest wall: No tenderness.  Abdominal:     General: Bowel sounds are normal. There is no distension.     Palpations: Abdomen is soft. There is no mass.     Tenderness: There is no abdominal tenderness. There is no guarding or rebound.  Musculoskeletal:        General: No tenderness or deformity. Normal range of motion.     Cervical back: Normal range of motion and neck supple.  Lymphadenopathy:     Cervical: No cervical adenopathy.  Skin:    General: Skin is warm.     Coloration: Skin is not pale.     Findings: No erythema or rash.  Neurological:     Mental Status: He is alert and oriented to person, place, and time.     Cranial Nerves: No cranial nerve deficit.     Motor: No abnormal muscle tone.     Coordination: Coordination normal.     Deep Tendon Reflexes: Reflexes are normal and symmetric.  Psychiatric:        Behavior: Behavior normal.        Thought Content: Thought content normal.  Judgment: Judgment normal.           Assessment & Plan:  General medical exam  Pure hypercholesterolemia  Prostate cancer screening  S/P CABG x 4  Essential hypertension Physical exam today  is excellent.  Blood pressure is outstanding.  Blood work is excellent.  I did recommend a vitamin B12 supplement.  Colonoscopy is up-to-date.  Immunizations are up-to-date.  Regular anticipatory guidance is provided. Lab on 05/15/2023  Component Date Value Ref Range Status   WBC 05/15/2023 6.1  3.8 - 10.8 Thousand/uL Final   RBC 05/15/2023 4.32  4.20 - 5.80 Million/uL Final   Hemoglobin 05/15/2023 14.9  13.2 - 17.1 g/dL Final   HCT 16/10/9602 43.6  38.5 - 50.0 % Final   MCV 05/15/2023 100.9 (H)  80.0 - 100.0 fL Final   MCH 05/15/2023 34.5 (H)  27.0 - 33.0 pg Final   MCHC 05/15/2023 34.2  32.0 - 36.0 g/dL Final   Comment: For adults, a slight decrease in the calculated MCHC value (in the range of 30 to 32 g/dL) is most likely not clinically significant; however, it should be interpreted with caution in correlation with other red cell parameters and the patient's clinical condition.    RDW 05/15/2023 12.5  11.0 - 15.0 % Final   Platelets 05/15/2023 203  140 - 400 Thousand/uL Final   MPV 05/15/2023 10.2  7.5 - 12.5 fL Final   Neutro Abs 05/15/2023 3,129  1,500 - 7,800 cells/uL Final   Absolute Lymphocytes 05/15/2023 1,714  850 - 3,900 cells/uL Final   Absolute Monocytes 05/15/2023 799  200 - 950 cells/uL Final   Eosinophils Absolute 05/15/2023 384  15 - 500 cells/uL Final   Basophils Absolute 05/15/2023 73  0 - 200 cells/uL Final   Neutrophils Relative % 05/15/2023 51.3  % Final   Total Lymphocyte 05/15/2023 28.1  % Final   Monocytes Relative 05/15/2023 13.1  % Final   Eosinophils Relative 05/15/2023 6.3  % Final   Basophils Relative 05/15/2023 1.2  % Final   Glucose, Bld 05/15/2023 100 (H)  65 - 99 mg/dL Final   Comment: .            Fasting reference interval . For someone without known diabetes, a glucose value between 100 and 125 mg/dL is consistent with prediabetes and should be confirmed with a follow-up test. .    BUN 05/15/2023 21  7 - 25 mg/dL Final   Creat 54/09/8117  1.03  0.70 - 1.22 mg/dL Final   BUN/Creatinine Ratio 05/15/2023 SEE NOTE:  6 - 22 (calc) Final   Comment:    Not Reported: BUN and Creatinine are within    reference range. .    Sodium 05/15/2023 140  135 - 146 mmol/L Final   Potassium 05/15/2023 4.6  3.5 - 5.3 mmol/L Final   Chloride 05/15/2023 100  98 - 110 mmol/L Final   CO2 05/15/2023 33 (H)  20 - 32 mmol/L Final   Calcium  05/15/2023 9.1  8.6 - 10.3 mg/dL Final   Total Protein 14/78/2956 6.4  6.1 - 8.1 g/dL Final   Albumin  05/15/2023 3.9  3.6 - 5.1 g/dL Final   Globulin 21/30/8657 2.5  1.9 - 3.7 g/dL (calc) Final   AG Ratio 05/15/2023 1.6  1.0 - 2.5 (calc) Final   Total Bilirubin 05/15/2023 0.7  0.2 - 1.2 mg/dL Final   Alkaline phosphatase (APISO) 05/15/2023 61  35 - 144 U/L Final   AST 05/15/2023 17  10 - 35  U/L Final   ALT 05/15/2023 13  9 - 46 U/L Final   Cholesterol 05/15/2023 137  <200 mg/dL Final   HDL 69/62/9528 55  > OR = 40 mg/dL Final   Triglycerides 41/32/4401 108  <150 mg/dL Final   LDL Cholesterol (Calc) 05/15/2023 63  mg/dL (calc) Final   Comment: Reference range: <100 . Desirable range <100 mg/dL for primary prevention;   <70 mg/dL for patients with CHD or diabetic patients  with > or = 2 CHD risk factors. Aaron Aas LDL-C is now calculated using the Martin-Hopkins  calculation, which is a validated novel method providing  better accuracy than the Friedewald equation in the  estimation of LDL-C.  Melinda Sprawls et al. Erroll Heard. 0272;536(64): 2061-2068  (http://education.[facility].com/faq/FAQ164)    Total CHOL/HDL Ratio 05/15/2023 2.5  <4.0 (calc) Final   Non-HDL Cholesterol (Calc) 05/15/2023 82  <130 mg/dL (calc) Final   Comment: For patients with diabetes plus 1 major ASCVD risk  factor, treating to a non-HDL-C goal of <100 mg/dL  (LDL-C of <34 mg/dL) is considered a therapeutic  option.    PSA 05/15/2023 0.68  < OR = 4.00 ng/mL Final   Comment: The total PSA value from this assay system is  standardized against  the WHO standard. The test  result will be approximately 20% lower when compared  to the equimolar-standardized total PSA (Beckman  Coulter). Comparison of serial PSA results should be  interpreted with this fact in mind. . This test was performed using the Siemens  chemiluminescent method. Values obtained from  different assay methods cannot be used interchangeably. PSA levels, regardless of value, should not be interpreted as absolute evidence of the presence or absence of disease.   No diagnosis found.

## 2023-06-07 DIAGNOSIS — Z85828 Personal history of other malignant neoplasm of skin: Secondary | ICD-10-CM | POA: Diagnosis not present

## 2023-06-07 DIAGNOSIS — Z08 Encounter for follow-up examination after completed treatment for malignant neoplasm: Secondary | ICD-10-CM | POA: Diagnosis not present

## 2023-07-13 ENCOUNTER — Other Ambulatory Visit: Payer: Self-pay

## 2023-07-13 DIAGNOSIS — Z006 Encounter for examination for normal comparison and control in clinical research program: Secondary | ICD-10-CM

## 2023-08-01 LAB — GENECONNECT MOLECULAR SCREEN: Genetic Analysis Overall Interpretation: NEGATIVE

## 2023-08-07 ENCOUNTER — Ambulatory Visit: Admitting: Family Medicine

## 2023-08-07 VITALS — BP 138/62 | HR 51 | Temp 98.6°F | Ht 67.0 in | Wt 161.0 lb

## 2023-08-07 DIAGNOSIS — M5412 Radiculopathy, cervical region: Secondary | ICD-10-CM | POA: Diagnosis not present

## 2023-08-07 DIAGNOSIS — K219 Gastro-esophageal reflux disease without esophagitis: Secondary | ICD-10-CM | POA: Insufficient documentation

## 2023-08-07 MED ORDER — PREDNISONE 20 MG PO TABS
ORAL_TABLET | ORAL | 0 refills | Status: DC
Start: 2023-08-07 — End: 2023-08-18

## 2023-08-07 NOTE — Progress Notes (Signed)
 1  Subjective:    Patient ID: Alex Martin, male    DOB: 11/20/1940, 83 y.o.   MRN: [id number]  Dizziness  Arm Pain   Patient reports pain in his left arm.  He reports weakness with obstruction in the left arm.  He reports that burning and aching pain in his left bicep, around the medial and lateral epicondyles, and also into his left forearm.  He reports no injury.  He states that he woke up like this.  He denies any numbness or tingling in his left hand.  He denies any weakness in his grip strength.  He does report subjective weakness with elbow flexion and extension but this is not reproducible on exam today.  There is no evidence of shingles.  There is no visible rash.  Patient has no pain with range of motion in his neck.  Past Medical History:  Diagnosis Date   Abdominal distension    UMBILICAL HERNIA-NO PAIN   Aortic insufficiency 02/23/2021   Echocardiogram 06/13/2018:  EF 60-65, mild LVH, Gr 1 DD, no RWMA, normla RVSF, severe LAE, mild AI   Barrett's esophagus    Bleeding from the nose    SEVERAL EPISODES THAT REQUIRED TX IN ER--LAST EPISODE WAS APPROX 1 YR AGO   Blood in urine    MICROSCOPIC BLOODEVALUATED BY DR. NESI FRIDAY 02/11/11--URINE SAMPLE AND EXAM-- AND BLOOD SAMPLE AND TO BE SEEN AGAIN IN 6 MONTHS AND GIVEN VESICARE   FOR FREQUENCY AND URGENCY   CAD (coronary artery disease) 02/22/2021   NSTEMI in 01/2018 s/p CABG Post CABG AFib   Carotid artery disease (HCC) 02/22/2021   Pre CABG Dopplers 02/02/2018:  Bilat ICA 1-39   Coronary artery disease    GERD (gastroesophageal reflux disease)    Hyperlipidemia    Hypertension    Ischemic cardiomyopathy 02/22/2021   EF ~ 30 >> Improved to normal  Echocardiogram 06/13/2018:  EF 60-65, mild LVH, Gr 1 DD, no RWMA, normla RVSF, severe LAE, mild AI Echocardiogram 02/02/2018:  Mod LVH, EF 30-35, WMA c/w LAD infarct, normal RVSF, mild pulmonary hypertension    Past Surgical History:  Procedure Laterality Date   CARDIAC  CATHETERIZATION     CORONARY ARTERY BYPASS GRAFT N/A 02/05/2018   Procedure: CORONARY ARTERY BYPASS GRAFTING (CABG) x4 USING BILATERAL IMA'S WITH LEFT GREATER SAPHENOUS VEIN HARVEST AND EXPLORATION OF RIGHT SAPHENOUS;  Surgeon: Lucas Dorise POUR, MD;  Location: [facility] OR;  Service: Open Heart Surgery;  Laterality: N/A;   HERNIA REPAIR  1959   [facility]   LEFT HEART CATH AND CORONARY ANGIOGRAPHY N/A 02/02/2018   Procedure: LEFT HEART CATH AND CORONARY ANGIOGRAPHY;  Surgeon: [place], Peter M, MD;  Location: [facility] INVASIVE CV LAB;  Service: Cardiovascular;  Laterality: N/A;   TEE WITHOUT CARDIOVERSION N/A 02/05/2018   Procedure: TRANSESOPHAGEAL ECHOCARDIOGRAM (TEE);  Surgeon: Lucas Dorise POUR, MD;  Location: [facility] OR;  Service: Open Heart Surgery;  Laterality: N/A;   UMBILICAL HERNIA REPAIR  02/15/2011   Procedure: HERNIA REPAIR UMBILICAL ADULT;  Surgeon: Krystal JINNY Russell, MD;  Location: [facility] ORS;  Service: General;  Laterality: N/A;  umbilical hernia repair with mesh   VASECTOMY  1976   Current Outpatient Medications on File Prior to Visit  Medication Sig Dispense Refill   aspirin  EC 81 MG tablet Take 81 mg by mouth daily.     Cyanocobalamin (VITAMIN B-12 PO) Take 1 tablet by mouth daily.     esomeprazole  (NEXIUM ) 40 MG capsule Take 1 capsule (40 mg total)  by mouth daily. 90 capsule 3   hydrochlorothiazide  (HYDRODIURIL ) 25 MG tablet TAKE 1 TABLET BY MOUTH EVERY DAY 90 tablet 3   latanoprost (XALATAN) 0.005 % ophthalmic solution Place 1 drop into both eyes at bedtime.   0   losartan  (COZAAR ) 100 MG tablet TAKE 1 TABLET BY MOUTH EVERY DAY 90 tablet 3   metoprolol  succinate (TOPROL -XL) 25 MG 24 hr tablet TAKE 1 TABLET BY MOUTH EVERY DAY 90 tablet 3   Multiple Vitamin (MULTIVITAMIN) tablet Take 1 tablet by mouth daily.     rosuvastatin  (CRESTOR ) 40 MG tablet Take 1 tablet (40 mg total) by mouth daily. 90 tablet 3   solifenacin  (VESICARE ) 10 MG tablet Take 10 mg by mouth daily.     timolol (TIMOPTIC) 0.5 % ophthalmic  solution Place 1 drop into both eyes 2 (two) times daily.     No current facility-administered medications on file prior to visit.   Allergies  Allergen Reactions   Amlodipine  Swelling    swelling in ankles   Social History   Socioeconomic History   Marital status: Divorced    Spouse name: Not on file   Number of children: Not on file   Years of education: Not on file   Highest education level: Bachelor's degree (e.g., BA, AB, BS)  Occupational History   Not on file  Tobacco Use   Smoking status: Former    Current packs/day: 0.00    Types: Cigarettes    Start date: 02/21/1982    Quit date: 02/22/1992    Years since quitting: 31.4   Smokeless tobacco: Never   Tobacco comments:    QUIT SMOKING ABOUT 1994 - 1 PP WEEK  Vaping Use   Vaping status: Never Used  Substance and Sexual Activity   Alcohol use: Yes    Comment: 2 GLASSES WINE DAILY   Drug use: No   Sexual activity: Not on file  Other Topics Concern   Not on file  Social History Narrative   Not on file   Social Drivers of Health   Financial Resource Strain: Medium Risk (08/07/2023)   Overall Financial Resource Strain (CARDIA)    Difficulty of Paying Living Expenses: Somewhat hard  Food Insecurity: No Food Insecurity (08/07/2023)   Hunger Vital Sign    Worried About Running Out of Food in the Last Year: Never true    Ran Out of Food in the Last Year: Never true  Transportation Needs: No Transportation Needs (08/07/2023)   PRAPARE - Administrator, Civil Service (Medical): No    Lack of Transportation (Non-Medical): No  Physical Activity: Sufficiently Active (08/07/2023)   Exercise Vital Sign    Days of Exercise per Week: 3 days    Minutes of Exercise per Session: 60 min  Stress: Stress Concern Present (08/07/2023)   [employer] of Occupational Health - Occupational Stress Questionnaire    Feeling of Stress: To some extent  Social Connections: Socially Isolated (08/07/2023)   Social Connection and  Isolation Panel    Frequency of Communication with Friends and Family: Three times a week    Frequency of Social Gatherings with Friends and Family: Three times a week    Attends Religious Services: Never    Active Member of Clubs or Organizations: No    Attends Banker Meetings: Not on file    Marital Status: Divorced  Intimate Partner Violence: Not At Risk (10/13/2022)   Humiliation, Afraid, Rape, and Kick questionnaire    Fear of  Current or Ex-Partner: No    Emotionally Abused: No    Physically Abused: No    Sexually Abused: No      Review of Systems  Neurological:  Positive for dizziness.  All other systems reviewed and are negative.      Objective:   Physical Exam Vitals reviewed.  Constitutional:      General: He is not in acute distress.    Appearance: Normal appearance. He is well-developed and normal weight. He is not ill-appearing, toxic-appearing or diaphoretic.  HENT:     Head: Normocephalic and atraumatic.  Neck:     Thyroid: No thyromegaly.     Vascular: No carotid bruit or JVD.     Trachea: No tracheal deviation.  Cardiovascular:     Rate and Rhythm: Normal rate and regular rhythm.     Pulses: Normal pulses.     Heart sounds: Normal heart sounds. No murmur heard.    No friction rub. No gallop.  Pulmonary:     Effort: Pulmonary effort is normal. No respiratory distress.     Breath sounds: Normal breath sounds. No stridor. No wheezing or rales.  Chest:     Chest wall: No tenderness.  Musculoskeletal:     Right shoulder: Normal. Normal range of motion.     Left shoulder: Normal. Normal range of motion.     Left upper arm: No swelling, edema, tenderness or bony tenderness.     Right elbow: No swelling, deformity, effusion or lacerations. Normal range of motion.     Left elbow: No swelling, deformity, effusion or lacerations. Normal range of motion.     Cervical back: Normal range of motion and neck supple. No pain with movement, spinous  process tenderness or muscular tenderness.  Lymphadenopathy:     Cervical: No cervical adenopathy.  Skin:    General: Skin is warm.     Coloration: Skin is not pale.     Findings: No erythema or rash.  Neurological:     Mental Status: He is alert and oriented to person, place, and time.     Cranial Nerves: No cranial nerve deficit.     Motor: No weakness or abnormal muscle tone.     Coordination: Coordination normal.     Gait: Gait normal.     Deep Tendon Reflexes: Reflexes are normal and symmetric. Reflexes normal.  Psychiatric:        Behavior: Behavior normal.        Thought Content: Thought content normal.        Judgment: Judgment normal.           Assessment & Plan:  Cervical radiculopathy Physical exam today is completely normal.  History is vague and hard to discern the exact cause of the pain.  The patient states that he woke up like this.  I see no evidence of a stroke.  I believe that he is dealing with cervical radiculopathy.  A lot of his pain sounds neuropathic going down the left arm.  Begin prednisone  taper pack and recheck in 1 week or sooner if worse

## 2023-08-09 ENCOUNTER — Other Ambulatory Visit: Payer: Self-pay | Admitting: Cardiology

## 2023-08-16 DIAGNOSIS — H524 Presbyopia: Secondary | ICD-10-CM | POA: Diagnosis not present

## 2023-08-16 DIAGNOSIS — H401131 Primary open-angle glaucoma, bilateral, mild stage: Secondary | ICD-10-CM | POA: Diagnosis not present

## 2023-08-16 DIAGNOSIS — H52223 Regular astigmatism, bilateral: Secondary | ICD-10-CM | POA: Diagnosis not present

## 2023-08-16 DIAGNOSIS — H43813 Vitreous degeneration, bilateral: Secondary | ICD-10-CM | POA: Diagnosis not present

## 2023-08-16 DIAGNOSIS — H35032 Hypertensive retinopathy, left eye: Secondary | ICD-10-CM | POA: Diagnosis not present

## 2023-08-18 ENCOUNTER — Other Ambulatory Visit: Payer: Self-pay | Admitting: Family Medicine

## 2023-08-18 ENCOUNTER — Telehealth: Payer: Self-pay

## 2023-08-18 MED ORDER — PREDNISONE 20 MG PO TABS: ORAL_TABLET | ORAL | 0 refills | Status: AC

## 2023-08-18 NOTE — Telephone Encounter (Signed)
 Copied from CRM [id number]. Topic: Clinical - Medication Question >> Aug 18, 2023 11:15 AM Sophia H wrote: Reason for CRM: Patient was seen in clinic last week regarding arm pain  that he was having, states a round of medication was given to him and it did help but 3 days after finishing the round of medication his pain has come back. Patient would like to know if he can get a refill of that medication or if he will need to come back in and be seen again. Looks like a prednisone  taper pack was prescribed by Dr. Duanne. Please advise # [phone]  [id number] [address] [place], [place] - [address]

## 2023-09-12 DIAGNOSIS — X32XXXD Exposure to sunlight, subsequent encounter: Secondary | ICD-10-CM | POA: Diagnosis not present

## 2023-09-12 DIAGNOSIS — L570 Actinic keratosis: Secondary | ICD-10-CM | POA: Diagnosis not present

## 2023-10-04 DIAGNOSIS — H903 Sensorineural hearing loss, bilateral: Secondary | ICD-10-CM | POA: Diagnosis not present

## 2023-11-13 DIAGNOSIS — Z7982 Long term (current) use of aspirin: Secondary | ICD-10-CM | POA: Diagnosis not present

## 2023-11-13 DIAGNOSIS — M8580 Other specified disorders of bone density and structure, unspecified site: Secondary | ICD-10-CM | POA: Diagnosis not present

## 2023-11-13 DIAGNOSIS — I2510 Atherosclerotic heart disease of native coronary artery without angina pectoris: Secondary | ICD-10-CM | POA: Diagnosis not present

## 2023-11-13 DIAGNOSIS — H409 Unspecified glaucoma: Secondary | ICD-10-CM | POA: Diagnosis not present

## 2023-11-13 DIAGNOSIS — E785 Hyperlipidemia, unspecified: Secondary | ICD-10-CM | POA: Diagnosis not present

## 2023-11-13 DIAGNOSIS — I10 Essential (primary) hypertension: Secondary | ICD-10-CM | POA: Diagnosis not present

## 2023-11-13 DIAGNOSIS — K219 Gastro-esophageal reflux disease without esophagitis: Secondary | ICD-10-CM | POA: Diagnosis not present

## 2023-11-13 DIAGNOSIS — N400 Enlarged prostate without lower urinary tract symptoms: Secondary | ICD-10-CM | POA: Diagnosis not present

## 2023-11-13 DIAGNOSIS — H269 Unspecified cataract: Secondary | ICD-10-CM | POA: Diagnosis not present

## 2023-11-29 ENCOUNTER — Ambulatory Visit

## 2023-11-29 VITALS — BP 130/64 | Ht 67.0 in | Wt 161.0 lb

## 2023-11-29 DIAGNOSIS — Z0000 Encounter for general adult medical examination without abnormal findings: Secondary | ICD-10-CM | POA: Diagnosis not present

## 2023-11-29 NOTE — Progress Notes (Signed)
 Chief Complaint  Patient presents with   Medicare Wellness     Subjective:   Alex Martin is a 83 y.o. male who presents for a Medicare Annual Wellness Visit.  Allergies (verified) Amlodipine    History: Past Medical History:  Diagnosis Date   Abdominal distension    UMBILICAL HERNIA-NO PAIN   Aortic insufficiency 02/23/2021   Echocardiogram 06/13/2018:  EF 60-65, mild LVH, Gr 1 DD, no RWMA, normla RVSF, severe LAE, mild AI   Arthritis    Barrett's esophagus    Bleeding from the nose    SEVERAL EPISODES THAT REQUIRED TX IN ER--LAST EPISODE WAS APPROX 1 YR AGO   Blood in urine    MICROSCOPIC BLOODEVALUATED BY DR. NESI FRIDAY 02/11/11--URINE SAMPLE AND EXAM-- AND BLOOD SAMPLE AND TO BE SEEN AGAIN IN 6 MONTHS AND GIVEN VESICARE   FOR FREQUENCY AND URGENCY   CAD (coronary artery disease) 02/22/2021   NSTEMI in 01/2018 s/p CABG Post CABG AFib   Carotid artery disease 02/22/2021   Pre CABG Dopplers 02/02/2018:  Bilat ICA 1-39   Coronary artery disease    GERD (gastroesophageal reflux disease)    Hyperlipidemia    Hypertension    Ischemic cardiomyopathy 02/22/2021   EF ~ 30 >> Improved to normal  Echocardiogram 06/13/2018:  EF 60-65, mild LVH, Gr 1 DD, no RWMA, normla RVSF, severe LAE, mild AI Echocardiogram 02/02/2018:  Mod LVH, EF 30-35, WMA c/w LAD infarct, normal RVSF, mild pulmonary hypertension    Past Surgical History:  Procedure Laterality Date   CARDIAC CATHETERIZATION     CORONARY ARTERY BYPASS GRAFT N/A 02/05/2018   Procedure: CORONARY ARTERY BYPASS GRAFTING (CABG) x4 USING BILATERAL IMA'S WITH LEFT GREATER SAPHENOUS VEIN HARVEST AND EXPLORATION OF RIGHT SAPHENOUS;  Surgeon: Lucas Dorise POUR, MD;  Location: [facility] OR;  Service: Open Heart Surgery;  Laterality: N/A;   HERNIA REPAIR  1959   [facility]   LEFT HEART CATH AND CORONARY ANGIOGRAPHY N/A 02/02/2018   Procedure: LEFT HEART CATH AND CORONARY ANGIOGRAPHY;  Surgeon: Jordan, Peter M, MD;  Location: [facility] INVASIVE CV LAB;  Service:  Cardiovascular;  Laterality: N/A;   TEE WITHOUT CARDIOVERSION N/A 02/05/2018   Procedure: TRANSESOPHAGEAL ECHOCARDIOGRAM (TEE);  Surgeon: Lucas Dorise POUR, MD;  Location: [facility] OR;  Service: Open Heart Surgery;  Laterality: N/A;   UMBILICAL HERNIA REPAIR  02/15/2011   Procedure: HERNIA REPAIR UMBILICAL ADULT;  Surgeon: Krystal JINNY Russell, MD;  Location: [facility] ORS;  Service: General;  Laterality: N/A;  umbilical hernia repair with mesh   VASECTOMY  1976   Family History  Problem Relation Age of Onset   Heart disease Mother    Varicose Veins Mother    Heart disease Father    Hearing loss Father    Cancer Sister        pancreatic   Stroke Maternal Grandmother    Stroke Maternal Grandfather    Arthritis Sister    Kidney disease Sister    Stroke Maternal Uncle    Esophageal cancer Neg Hx    Stomach cancer Neg Hx    Pancreatic cancer Neg Hx    Social History   Occupational History   Not on file  Tobacco Use   Smoking status: Former    Current packs/day: 0.00    Types: Cigarettes    Start date: 02/21/1982    Quit date: 02/22/1992    Years since quitting: 31.7   Smokeless tobacco: Never   Tobacco comments:    QUIT SMOKING ABOUT 1994 -  1 PP WEEK  Vaping Use   Vaping status: Never Used  Substance and Sexual Activity   Alcohol use: Yes    Comment: 2 GLASSES WINE DAILY   Drug use: No   Sexual activity: Not on file   Tobacco Counseling Counseling given: Not Answered Tobacco comments: QUIT SMOKING ABOUT 1994 - 1 PP WEEK  SDOH Screenings   Food Insecurity: No Food Insecurity (11/29/2023)  Housing: Low Risk  (11/29/2023)  Transportation Needs: No Transportation Needs (11/29/2023)  Utilities: Not At Risk (11/29/2023)  Alcohol Screen: Low Risk  (08/07/2023)  Depression (PHQ2-9): Low Risk  (11/29/2023)  Financial Resource Strain: Medium Risk (08/07/2023)  Physical Activity: Sufficiently Active (11/29/2023)  Social Connections: Socially Isolated (11/29/2023)  Stress: Stress Concern Present  (11/29/2023)  Tobacco Use: Medium Risk (11/29/2023)  Health Literacy: Adequate Health Literacy (11/29/2023)   Depression Screen    11/29/2023   10:45 AM 05/18/2023    9:32 AM 10/25/2022    4:15 PM 10/13/2022    2:44 PM 05/11/2021   10:33 AM 04/30/2020   10:33 AM 04/16/2019    8:27 AM  PHQ 2/9 Scores  PHQ - 2 Score 0 0 0 0 0 0 0  PHQ- 9 Score     0  0       Data saved with a previous flowsheet row definition      Goals Addressed             This Visit's Progress    Remain active and independent   On track      Visit info / Clinical Intake: Medicare Wellness Visit Type:: Subsequent Annual Wellness Visit Persons participating in visit:: patient Medicare Wellness Visit Mode:: Telephone If telephone:: video declined Because this visit was a virtual/telehealth visit:: vitals recorded from last visit If Telephone or Video please confirm:: I connected with the patient using audio enabled telemedicine application and verified that I am speaking with the correct person using two identifiers; I discussed the limitations of evaluation and management by telemedicine; The patient expressed understanding and agreed to proceed Patient Location:: home Provider Location:: office Information given by:: patient Interpreter Needed?: No Pre-visit prep was completed: yes AWV questionnaire completed by patient prior to visit?: no Living arrangements:: (!) lives alone Patient's Overall Health Status Rating: excellent Typical amount of pain: some Does pain affect daily life?: no Are you currently prescribed opioids?: no  Dietary Habits and Nutritional Risks How many meals a day?: 3 Eats fruit and vegetables daily?: yes Most meals are obtained by: preparing own meals Diabetic:: no  Functional Status Activities of Daily Living (to include ambulation/medication): Independent Ambulation: Independent Medication Administration: Independent Home Management: Independent Manage your own  finances?: yes Primary transportation is: driving Concerns about vision?: no *vision screening is required for WTM* Concerns about hearing?: (!) yes Uses hearing aids?: (!) yes  Fall Screening Falls in the past year?: 0 Number of falls in past year: 0 Was there an injury with Fall?: 0 Fall Risk Category Calculator: 0 Patient Fall Risk Level: Low Fall Risk  Fall Risk Patient at Risk for Falls Due to: No Fall Risks Fall risk Follow up: Falls evaluation completed; Falls prevention discussed; Education provided  Home and Transportation Safety: All rugs have non-skid backing?: yes All stairs or steps have railings?: yes Grab bars in the bathtub or shower?: yes Have non-skid surface in bathtub or shower?: yes Good home lighting?: yes Regular seat belt use?: yes Hospital stays in the last year:: no  Cognitive Assessment Difficulty  concentrating, remembering, or making decisions? : no Will 6CIT or Mini Cog be Completed: no 6CIT or Mini Cog Declined: patient alert, oriented, able to answer questions appropriately and recall recent events  Advance Directives (For Healthcare) Does Patient Have a Medical Advance Directive?: No Would patient like information on creating a medical advance directive?: Yes (MAU/Ambulatory/Procedural Areas - Information given)  Reviewed/Updated  Reviewed/Updated: Reviewed All (Medical, Surgical, Family, Medications, Allergies, Care Teams, Patient Goals)        Objective:    Today's Vitals   11/29/23 0820  BP: 130/64  Weight: 161 lb (73 kg)  Height: 5' 7 (1.702 m)   Body mass index is 25.22 kg/m.  Current Medications (verified) Outpatient Encounter Medications as of 11/29/2023  Medication Sig   aspirin  EC 81 MG tablet Take 81 mg by mouth daily.   Cyanocobalamin (VITAMIN B-12 PO) Take 1 tablet by mouth daily.   esomeprazole  (NEXIUM ) 40 MG capsule Take 1 capsule (40 mg total) by mouth daily.   hydrochlorothiazide  (HYDRODIURIL ) 25 MG tablet TAKE  1 TABLET BY MOUTH EVERY DAY   latanoprost (XALATAN) 0.005 % ophthalmic solution Place 1 drop into both eyes at bedtime.    losartan  (COZAAR ) 100 MG tablet TAKE 1 TABLET BY MOUTH EVERY DAY   metoprolol  succinate (TOPROL -XL) 25 MG 24 hr tablet TAKE 1 TABLET BY MOUTH EVERY DAY   Multiple Vitamin (MULTIVITAMIN) tablet Take 1 tablet by mouth daily.   rosuvastatin  (CRESTOR ) 40 MG tablet TAKE 1 TABLET BY MOUTH EVERY DAY   solifenacin  (VESICARE ) 10 MG tablet Take 10 mg by mouth daily.   timolol (TIMOPTIC) 0.5 % ophthalmic solution Place 1 drop into both eyes 2 (two) times daily.   predniSONE  (DELTASONE ) 20 MG tablet 3 tabs poqday 1-2, 2 tabs poqday 3-4, 1 tab poqday 5-6 (Patient not taking: Reported on 11/29/2023)   No facility-administered encounter medications on file as of 11/29/2023.   Hearing/Vision screen Hearing Screening - Comments:: Followed by audiology and wears hearing aids  Vision Screening - Comments:: Wears rx glasses - up to date with routine eye exams with Dr. Kennyth  Immunizations and Health Maintenance Health Maintenance  Topic Date Due   COVID-19 Vaccine (7 - 2025-26 season) 09/18/2023   Medicare Annual Wellness (AWV)  11/28/2024   DTaP/Tdap/Td (3 - Td or Tdap) 05/22/2032   Pneumococcal Vaccine: 50+ Years  Completed   Influenza Vaccine  Completed   Zoster Vaccines- Shingrix  Completed   Meningococcal B Vaccine  Aged Out   Hepatitis C Screening  Discontinued        Assessment/Plan:  This is a routine wellness examination for Alex Martin.  Patient Care Team: Duanne Reasoner DASEN, MD as PCP - General (Family Medicine) Jeffrie Oneil BROCKS, MD as PCP - Cardiology (Cardiology) Kennyth Worth HERO, MD as Consulting Physician (Family Medicine) Nieves Cough, MD as Consulting Physician (Urology) Lucas Dorise POUR, MD as Consulting Physician (Cardiothoracic Surgery) Junior Emmit Ivanoff, MD as Referring Physician (Dermatology)  I have personally reviewed and noted the following in the  patient's chart:   Medical and social history Use of alcohol, tobacco or illicit drugs  Current medications and supplements including opioid prescriptions. Functional ability and status Nutritional status Physical activity Advanced directives List of other physicians Hospitalizations, surgeries, and ER visits in previous 12 months Vitals Screenings to include cognitive, depression, and falls Referrals and appointments  No orders of the defined types were placed in this encounter.  In addition, I have reviewed and discussed with patient certain preventive protocols,  quality metrics, and best practice recommendations. A written personalized care plan for preventive services as well as general preventive health recommendations were provided to patient.   Alex Charmaine Browner, LPN   [id number]   Return in 1 year (on 11/28/2024).  After Visit Summary: (MyChart) Due to this being a telephonic visit, the after visit summary with patients personalized plan was offered to patient via MyChart   Nurse Notes: No concerns

## 2023-11-29 NOTE — Patient Instructions (Signed)
 Alex Martin,  Thank you for taking the time for your Medicare Wellness Visit. I appreciate your continued commitment to your health goals. Please review the care plan we discussed, and feel free to reach out if I can assist you further.  Please note that Annual Wellness Visits do not include a physical exam. Some assessments may be limited, especially if the visit was conducted virtually. If needed, we may recommend an in-person follow-up with your provider.  Ongoing Care Seeing your primary care provider every 3 to 6 months helps us  monitor your health and provide consistent, personalized care.   Referrals If a referral was made during today's visit and you haven't received any updates within two weeks, please contact the referred provider directly to check on the status.  Recommended Screenings:  Health Maintenance  Topic Date Due   COVID-19 Vaccine (7 - 2025-26 season) 09/18/2023   Medicare Annual Wellness Visit  11/28/2024   DTaP/Tdap/Td vaccine (3 - Td or Tdap) 05/22/2032   Pneumococcal Vaccine for age over 59  Completed   Flu Shot  Completed   Zoster (Shingles) Vaccine  Completed   Meningitis B Vaccine  Aged Out   Hepatitis C Screening  Discontinued       11/29/2023   10:43 AM  Advanced Directives  Does Patient Have a Medical Advance Directive? No  Would patient like information on creating a medical advance directive? Yes (MAU/Ambulatory/Procedural Areas - Information given)   Information on Advanced Care Planning can be found at [place]  Secretary of [facility] Advance Health Care Directives Advance Health Care Directives ([email or web address]   Vision: Annual vision screenings are recommended for early detection of glaucoma, cataracts, and diabetic retinopathy. These exams can also reveal signs of chronic conditions such as diabetes and high blood pressure.  Dental: Annual dental screenings help detect early signs of oral cancer, gum disease, and other conditions linked to overall  health, including heart disease and diabetes.  Please see the attached documents for additional preventive care recommendations.    Information on possible insurance benefits:    If you have an OTC allowance or Healthy Options allowance, you will need to activate your prepaid card before making purchases from this catalog. Eligible members can call [phone], 24 hours a day, seven days a week, or visit [email or web address] to activate the card and check the balance

## 2023-11-30 ENCOUNTER — Ambulatory Visit

## 2024-01-08 NOTE — Progress Notes (Signed)
 Pharmacy Quality Measure Review  This patient is appearing on a report for being at risk of failing the adherence measure for hypertension (ACEi/ARB) medications this calendar year.   Medication: Losartan  100 mg Last fill date: 12/29/23 for 90 day supply, sold 12/30/23  Insurance report was not up to date. No action needed at this time.   Jenkins Graces, PharmD PGY1 Pharmacy Resident

## 2024-01-30 ENCOUNTER — Ambulatory Visit

## 2024-01-30 ENCOUNTER — Telehealth: Payer: Self-pay

## 2024-01-30 VITALS — BP 140/78

## 2024-01-30 DIAGNOSIS — I10 Essential (primary) hypertension: Secondary | ICD-10-CM

## 2024-01-30 NOTE — Telephone Encounter (Signed)
 Copied from CRM [id number]. Topic: Clinical - Order For Equipment >> Jan 30, 2024  2:22 PM Delon T wrote: Reason for CRM: need new bp monitor, [employer] needs a new form filled out- [phone]

## 2024-01-30 NOTE — Telephone Encounter (Signed)
 Pt came in for BP check today. BP was 138/70 in the L arm and 140/78 in the R arm. We compared the manual reading to pt home blood pressure monitor and the numbers were significantly different, (96/73).  Pt states that his blood pressure monitor is 84 years old. He stated that he is going to get a new blood pressure monitor. Pt was asymptomatic at visit. I encouraged pt to keep a check on his BP at home and to get a new bp monitor.

## 2024-01-30 NOTE — Progress Notes (Signed)
 Patient is in office today for a nurse visit for Blood Pressure Check. Patient blood pressure was 138/78/, 140/78, Patient asymptomatic  I advised patient to get a new blood pressure monitor due to his being older and not giving an accurate reading. The reading on his BP monitor was 96/73. PCP advised.

## 2024-02-05 ENCOUNTER — Other Ambulatory Visit: Payer: Self-pay

## 2024-02-05 DIAGNOSIS — I10 Essential (primary) hypertension: Secondary | ICD-10-CM

## 2024-02-05 MED ORDER — BLOOD PRESSURE MONITOR AUTOMAT DEVI: 0 refills | Status: AC

## 2024-04-23 ENCOUNTER — Ambulatory Visit: Admitting: Cardiology

## 2024-12-04 ENCOUNTER — Ambulatory Visit
